# Patient Record
Sex: Male | Born: 1935 | Race: White | Hispanic: No | Marital: Married | Smoking: Former smoker
Health system: Southern US, Community
[De-identification: ages and names within clinical notes are randomized; demographics above are authoritative.]

## PROBLEM LIST (undated history)

## (undated) DIAGNOSIS — I219 Acute myocardial infarction, unspecified: Secondary | ICD-10-CM

## (undated) DIAGNOSIS — N189 Chronic kidney disease, unspecified: Secondary | ICD-10-CM

## (undated) DIAGNOSIS — E785 Hyperlipidemia, unspecified: Secondary | ICD-10-CM

## (undated) DIAGNOSIS — N2589 Other disorders resulting from impaired renal tubular function: Secondary | ICD-10-CM

## (undated) DIAGNOSIS — C61 Malignant neoplasm of prostate: Secondary | ICD-10-CM

## (undated) DIAGNOSIS — I2510 Atherosclerotic heart disease of native coronary artery without angina pectoris: Secondary | ICD-10-CM

## (undated) DIAGNOSIS — E119 Type 2 diabetes mellitus without complications: Secondary | ICD-10-CM

## (undated) DIAGNOSIS — F17201 Nicotine dependence, unspecified, in remission: Secondary | ICD-10-CM

## (undated) DIAGNOSIS — I10 Essential (primary) hypertension: Secondary | ICD-10-CM

## (undated) HISTORY — DX: Atherosclerotic heart disease of native coronary artery without angina pectoris: I25.10

## (undated) HISTORY — PX: HERNIA REPAIR: SHX51

## (undated) HISTORY — DX: Nicotine dependence, unspecified, in remission: F17.201

## (undated) HISTORY — DX: Essential (primary) hypertension: I10

## (undated) HISTORY — DX: Other disorders resulting from impaired renal tubular function: N25.89

## (undated) HISTORY — DX: Hyperlipidemia, unspecified: E78.5

## (undated) HISTORY — DX: Type 2 diabetes mellitus without complications: E11.9

## (undated) HISTORY — DX: Chronic kidney disease, unspecified: N18.9

## (undated) HISTORY — PX: KNEE ARTHROSCOPY: SHX127

## (undated) HISTORY — DX: Malignant neoplasm of prostate: C61

---

## 1991-09-12 HISTORY — PX: CORONARY ANGIOPLASTY: SHX604

## 2000-12-29 ENCOUNTER — Encounter: Payer: Self-pay | Admitting: Emergency Medicine

## 2000-12-29 ENCOUNTER — Emergency Department (HOSPITAL_COMMUNITY): Admission: EM | Admit: 2000-12-29 | Discharge: 2000-12-29 | Payer: Self-pay | Admitting: Emergency Medicine

## 2001-03-25 ENCOUNTER — Encounter (HOSPITAL_COMMUNITY): Admission: RE | Admit: 2001-03-25 | Discharge: 2001-04-24 | Payer: Self-pay | Admitting: Orthopedic Surgery

## 2004-04-29 ENCOUNTER — Ambulatory Visit (HOSPITAL_COMMUNITY): Admission: RE | Admit: 2004-04-29 | Discharge: 2004-04-29 | Payer: Self-pay | Admitting: Cardiology

## 2004-07-13 ENCOUNTER — Ambulatory Visit: Payer: Self-pay | Admitting: Cardiology

## 2005-06-13 ENCOUNTER — Ambulatory Visit: Payer: Self-pay | Admitting: Cardiology

## 2005-07-13 ENCOUNTER — Ambulatory Visit: Payer: Self-pay

## 2006-12-20 ENCOUNTER — Ambulatory Visit: Payer: Self-pay | Admitting: Cardiology

## 2006-12-26 ENCOUNTER — Encounter (INDEPENDENT_AMBULATORY_CARE_PROVIDER_SITE_OTHER): Payer: Self-pay | Admitting: *Deleted

## 2006-12-26 LAB — CONVERTED CEMR LAB
Cholesterol: 134 mg/dL
HDL: 26 mg/dL
LDL Cholesterol: 32 mg/dL
Triglycerides: 380 mg/dL

## 2007-10-24 ENCOUNTER — Ambulatory Visit: Payer: Self-pay

## 2008-04-02 ENCOUNTER — Ambulatory Visit: Payer: Self-pay | Admitting: Cardiology

## 2009-07-15 ENCOUNTER — Encounter: Payer: Self-pay | Admitting: Cardiology

## 2009-07-15 ENCOUNTER — Encounter (INDEPENDENT_AMBULATORY_CARE_PROVIDER_SITE_OTHER): Payer: Self-pay | Admitting: *Deleted

## 2009-07-15 LAB — CONVERTED CEMR LAB
ALT: 10 units/L
ALT: 10 units/L
AST: 12 units/L
AST: 12 units/L
Albumin: 4.5 g/dL
Albumin: 4.5 g/dL
Alkaline Phosphatase: 61 units/L
Alkaline Phosphatase: 61 units/L
BUN: 36 mg/dL
CO2: 26 meq/L
CO2: 26 meq/L
Calcium: 9.9 mg/dL
Calcium: 9.9 mg/dL
Chloride: 106 meq/L
Chloride: 106 meq/L
Creatinine, Ser: 1.47 mg/dL
Ferritin: 154 ng/mL
Ferritin: 60 ng/mL
Glucose, Bld: 142 mg/dL
HCT: 39 %
HCT: 39 %
Hemoglobin, Urine: NEGATIVE
Hemoglobin: 13.2 g/dL
Hemoglobin: 13.2 g/dL
Iron: 84 ug/dL
Iron: 84 ug/dL
MCV: 90 fL
MCV: 90 fL
Nitrite: NEGATIVE
Platelets: 196 10*3/uL
Platelets: 196 10*3/uL
Potassium: 5.1 meq/L
Potassium: 5.1 meq/L
Protein, ur: NEGATIVE mg/dL
RBC / HPF: NONE SEEN
Saturation Ratios: 30 %
Saturation Ratios: 30 %
Sodium: 142 meq/L
Sodium: 142 meq/L
Specific Gravity, Urine: 1.01
TIBC: 282 ug/dL
TSH: 0.968 microintl units/mL
Total Protein: 7 g/dL
Total Protein: 7 g/dL
UIBC: 198 ug/dL
Urine Glucose: NEGATIVE mg/dL
WBC number, urine, microscopy: NONE SEEN /hpf
WBC: 6.4 10*3/uL
WBC: 6.4 10*3/uL
pH: 5

## 2009-08-10 ENCOUNTER — Encounter (INDEPENDENT_AMBULATORY_CARE_PROVIDER_SITE_OTHER): Payer: Self-pay | Admitting: *Deleted

## 2009-08-17 ENCOUNTER — Encounter: Payer: Self-pay | Admitting: Cardiology

## 2009-10-29 ENCOUNTER — Encounter (INDEPENDENT_AMBULATORY_CARE_PROVIDER_SITE_OTHER): Payer: Self-pay | Admitting: *Deleted

## 2009-11-04 ENCOUNTER — Ambulatory Visit: Payer: Self-pay | Admitting: Cardiology

## 2009-11-04 DIAGNOSIS — E119 Type 2 diabetes mellitus without complications: Secondary | ICD-10-CM

## 2009-11-04 LAB — CONVERTED CEMR LAB: TSH: 0.97 u[IU]/mL

## 2009-11-05 ENCOUNTER — Encounter (INDEPENDENT_AMBULATORY_CARE_PROVIDER_SITE_OTHER): Payer: Self-pay | Admitting: *Deleted

## 2009-11-15 ENCOUNTER — Encounter: Payer: Self-pay | Admitting: Cardiology

## 2009-11-15 ENCOUNTER — Encounter (INDEPENDENT_AMBULATORY_CARE_PROVIDER_SITE_OTHER): Payer: Self-pay | Admitting: *Deleted

## 2009-11-15 LAB — CONVERTED CEMR LAB
Cholesterol: 92 mg/dL
Cholesterol: 92 mg/dL (ref 0–200)
HDL: 26 mg/dL
HDL: 26 mg/dL — ABNORMAL LOW (ref >39)
LDL Cholesterol: 37 mg/dL
LDL Cholesterol: 37 mg/dL (ref 0–99)
Total CHOL/HDL Ratio: 3.5
Triglycerides: 145 mg/dL (ref <150)
VLDL: 29 mg/dL (ref 0–40)

## 2009-11-16 ENCOUNTER — Encounter (INDEPENDENT_AMBULATORY_CARE_PROVIDER_SITE_OTHER): Payer: Self-pay | Admitting: *Deleted

## 2010-05-08 ENCOUNTER — Emergency Department (HOSPITAL_COMMUNITY): Admission: EM | Admit: 2010-05-08 | Discharge: 2010-05-08 | Payer: Self-pay | Admitting: Emergency Medicine

## 2010-08-15 ENCOUNTER — Encounter: Payer: Self-pay | Admitting: Cardiology

## 2010-08-15 LAB — CONVERTED CEMR LAB
Basophils Absolute: 0 10*3/uL
Basophils Relative: 0 %
Calcium: 10.1 mg/dL
Chloride: 104 meq/L
Eosinophils Absolute: 0.3 10*3/uL
Eosinophils Relative: 3 %
HCT: 37.7 %
Hemoglobin: 12.4 g/dL
MCHC: 32.9 g/dL
MCV: 87 fL
Monocytes Absolute: 0.5 10*3/uL
Monocytes Relative: 6 %
RBC: 4.33 M/uL
Total Protein: 7 g/dL
WBC: 8.6 10*3/uL

## 2010-09-22 ENCOUNTER — Encounter: Payer: Self-pay | Admitting: Cardiology

## 2010-09-22 LAB — CONVERTED CEMR LAB
ALT: 10 units/L
AST: 12 units/L
Basophils Absolute: 0 10*3/uL
Basophils Relative: 0 %
Creatinine, Ser: 1.71 mg/dL
Eosinophils Relative: 2 %
GFR calc non Af Amer: 38 mL/min
Glomerular Filtration Rate, Af Am: 44 mL/min/{1.73_m2}
Hemoglobin: 11.8 g/dL
MCHC: 32.9 g/dL
MCV: 86 fL
Monocytes Absolute: 0.6 10*3/uL
Potassium: 5.3 meq/L
RBC: 4.17 M/uL
RDW: 13.9 %

## 2010-10-13 NOTE — Miscellaneous (Signed)
Summary: LABS LIPIDS,12/26/2006  Clinical Lists Changes  Observations: Added new observation of LDL: 32 mg/dL (09/81/1914 78:29) Added new observation of HDL: 26 mg/dL (56/21/3086 57:84) Added new observation of TRIGLYC TOT: 380 mg/dL (69/62/9528 41:32) Added new observation of CHOLESTEROL: 134 mg/dL (44/09/270 53:66)

## 2010-10-13 NOTE — Assessment & Plan Note (Signed)
Summary: F1Y  Medications Added METFORMIN HCL 1000 MG TABS (METFORMIN HCL) take 1 tab two times a day NIASPAN 500 MG CR-TABS (NIACIN (ANTIHYPERLIPIDEMIC)) take 1 tab daily RAMIPRIL 2.5 MG CAPS (RAMIPRIL) take 1 cap daily SIMVASTATIN 40 MG TABS (SIMVASTATIN) take 1 tab daily CALCITRIOL 0.25 MCG CAPS (CALCITRIOL) take 1 tab mon,wed,fri      Allergies Added:   Visit Type:  Follow-up Primary Provider:  Dr. Renard Matter  CC:  no complaints today.  History of Present Illness: Return visit for this very pleasant 77 year old gentleman with a long benign history of coronary artery disease.  I first met him after a percutaneous intervention in 1993.  He has had no cardiovascular symptoms since.  Controll of hypertension and hyperlipidemia has been good.  He exercises at the local gym and works around his home without difficulty.  He has had no recent new medical issues.  Current Medications (verified): 1)  Verapamil Hcl Cr 120 Mg Xr24h-Cap (Verapamil Hcl) .... Take One Capsule By Mouth Daily 2)  Metformin Hcl 1000 Mg Tabs (Metformin Hcl) .... Take 1 Tab Two Times A Day 3)  Furosemide 40 Mg Tabs (Furosemide) .... Take 1 Tab Two Times A Day 4)  Metoprolol Tartrate 100 Mg Tabs (Metoprolol Tartrate) .... Take 1 Tab Two Times A Day 5)  Aspir-Low 81 Mg Tbec (Aspirin) .... Take 1 Tab Daily 6)  Fish Oil 1000 Mg Caps (Omega-3 Fatty Acids) .... Take 1 Cap Two Times A Day 7)  Niaspan 500 Mg Cr-Tabs (Niacin (Antihyperlipidemic)) .... Take 1 Tab Daily 8)  Ramipril 2.5 Mg Caps (Ramipril) .... Take 1 Cap Daily 9)  Simvastatin 40 Mg Tabs (Simvastatin) .... Take 1 Tab Daily 10)  Calcitriol 0.25 Mcg Caps (Calcitriol) .... Take 1 Tab Mon,wed,fri  Allergies (verified): 1)  ! Lisinopril  Past History:  PMH, FH, and Social History reviewed and updated.  Past Surgical History: none  Review of Systems       see history of present illness   Vital Signs:  Patient profile:   77 year old male Height:      69  inches Weight:      203 pounds BMI:     30.09 Pulse rate:   60 / minute BP sitting:   111 / 65  (right arm)  Vitals Entered By: Dreama Saa, CNA (November 04, 2009 2:25 PM)  Physical Exam  General:   Overweight pleasant gentleman in no acute distress. NECK:  No jugular venous distention; no carotid bruits. LUNGS:  Clear. CARDIAC:  Normal first and second heart sounds; modest systolic ejection murmur  ABDOMEN:  Soft and nontender; no organomegaly. EXTREMITIES:  Distal pulses intact; trace pretibial edema.    Impression & Recommendations:  Problem # 1:  ATHEROSCLEROTIC CARDIOVASCULAR DISEASE (ICD-429.2) Mr. Beaulieu has done astoundingly well with respect to coronary artery disease.  Additional testing for ischemic heart disease will be undertaken if appropriate for future symptoms, if any.  Problem # 2:  HYPERTENSION (ICD-401.9) Blood pressure control is excellent; current medications will be continued.  Patient was congratulated on a 20 pound weight loss over the past year and a total loss of approximate 40 pounds.  Problem # 3:  HYPERTRIGLYCERIDEMIA (ICD-272.1) We will seek a recent lipid profile from [facility].  If none is available, one will be obtained and medication adjusted appropriately.  Problem # 4:  DIABETES MELLITUS, TYPE II (ICD-250.00) Capillary blood glucose values have been good at home.  Dr. Renard Matter will continue to monitor and  optimize treatment of diabetes.  Problem # 5:  CHRONIC KIDNEY DISEASE (ICD-585.9) Remarkably, despite a creatinine near 2 for a number of years, the most recent value available to me was 1.0 in 2008.  He continues to be followed by [facility] for this problem.    I will plan to see this nice gentleman again in one year.  Other Orders: Future Orders: T-Lipid Profile ([id number]) ... 11/15/2009  Patient Instructions: 1)  Your physician recommends that you schedule a follow-up appointment in: 1 YEAR 2)  Your physician  recommends that you continue on your current medications as directed. Please refer to the Current Medication list given to you today.

## 2010-10-13 NOTE — Letter (Signed)
Summary: [place] [facility] Work Engineer, agricultural at [employer]  [address] [place] [address]   Phone: [phone]  Fax: [phone]     November 05, 2009 MRN: [id number]   [facility] [address] [place], [place]  [address]      YOUR LAB WORK IS DUE  November 15, 2009 _________________________________________  Please go to [facility], located across the street from [facility] on the second floor.  Hours are Monday - Friday 7am until 7:30pm         Saturday 8am until 12noon    _X_  DO NOT EAT OR DRINK AFTER MIDNIGHT EVENING PRIOR TO LABWORK  __ YOUR LABWORK IS NOT FASTING --YOU MAY EAT PRIOR TO LABWORK

## 2010-10-13 NOTE — Letter (Signed)
Summary: [place] Results Engineer, agricultural at [facility]  [address], [place] [address]   Phone: [phone]  Fax: [phone]      November 16, 2009 MRN: [id number]   Samuel Garner [address] [place], [place]  [address]   Dear Mr. SLANE,  Your test ordered by Selena Batten has been reviewed by your physician (or physician assistant) and was found to be normal or stable. Your physician (or physician assistant) felt no changes were needed at this time.  ____ Echocardiogram  ____ Cardiac Stress Test  __x__ Lab Work  ____ Peripheral vascular study of arms, legs or neck  ____ CT scan or X-ray  ____ Lung or Breathing test  ____ Other:  No change in medical treatment at this time, per Dr. Dietrich Pates.  Enclosed is a copy of your labwork for your records.  Thank you, Shantelle Alles Allyne Gee RN    Hurley Bing, MD, Lenise Arena.C.Gaylord Shih, MD, F.A.C.C Lewayne Bunting, MD, F.A.C.C Nona Dell, MD, F.A.C.C Charlton Haws, MD, Lenise Arena.C.C

## 2010-11-02 ENCOUNTER — Encounter (INDEPENDENT_AMBULATORY_CARE_PROVIDER_SITE_OTHER): Payer: Self-pay | Admitting: *Deleted

## 2010-11-04 ENCOUNTER — Ambulatory Visit (INDEPENDENT_AMBULATORY_CARE_PROVIDER_SITE_OTHER): Payer: 59 | Admitting: Cardiology

## 2010-11-04 ENCOUNTER — Encounter: Payer: Self-pay | Admitting: Cardiology

## 2010-11-04 DIAGNOSIS — I2510 Atherosclerotic heart disease of native coronary artery without angina pectoris: Secondary | ICD-10-CM

## 2010-11-04 DIAGNOSIS — M109 Gout, unspecified: Secondary | ICD-10-CM | POA: Insufficient documentation

## 2010-11-04 LAB — CONVERTED CEMR LAB
AST: 10 units/L
Albumin: 4.1 g/dL
Alkaline Phosphatase: 65 units/L
BUN: 45 mg/dL
Calcium: 9.7 mg/dL
Chloride: 105 meq/L
Creatinine, Ser: 1.51 mg/dL
GFR calc non Af Amer: 45 mL/min

## 2010-11-07 ENCOUNTER — Encounter: Payer: Self-pay | Admitting: Cardiology

## 2010-11-08 NOTE — Letter (Signed)
Summary: [place] [facility] Work Engineer, agricultural at [employer]  [address], [place] [address]   Phone: [phone]  Fax: [phone]     November 04, 2010 MRN: [id number]   LAMARION MCEVERS [address] [place], [place]  [address]      YOUR LAB WORK IS DUE  November 28, 2010 _________________________________________  Please go to [facility], located across the street from [facility] on the second floor.  Hours are Monday - Friday 7am until 7:30pm         Saturday 8am until 12noon    _X_  DO NOT EAT OR DRINK AFTER MIDNIGHT EVENING PRIOR TO LABWORK  __ YOUR LABWORK IS NOT FASTING --YOU MAY EAT PRIOR TO LABWORK

## 2010-11-08 NOTE — Miscellaneous (Signed)
Summary: labs lipid 11/15/2009  Clinical Lists Changes  Observations: Added new observation of LDL: 37 mg/dL (62/13/0865 78:46) Added new observation of HDL: 26 mg/dL (96/29/5284 13:24) Added new observation of TRIGLYC TOT: 145 mg/dL (40/06/2724 36:64) Added new observation of CHOLESTEROL: 92 mg/dL (40/34/7425 95:63)

## 2010-11-17 NOTE — Letter (Signed)
Summary: [facility]  [facility]   Imported By: Faythe Ghee 11/07/2010 11:28:43  _____________________________________________________________________  External Attachment:    Type:   Image     Comment:   External Document

## 2010-11-17 NOTE — Assessment & Plan Note (Addendum)
Summary: 1 yr f/u per ckout 11/04/09//tg/ths  Medications Added PRAVASTATIN SODIUM 40 MG TABS (PRAVASTATIN SODIUM) take 1 tablet by mouth at bedtime ALLOPURINOL 100 MG TABS (ALLOPURINOL) take 1 tab daily METHYLPREDNISOLONE 4 MG TABS (METHYLPREDNISOLONE) 5 tabs today      Allergies Added:   Visit Type:  Follow-up Primary Provider:  Dr. Renard Matter   History of Present Illness: [name] returns to the office as scheduled for continued assessment and treatment of coronary disease and cardiovascular risk factors.  He continues to do extremely well, denying chest discomfort, dyspnea, orthopnea, PND, lightheadedness or syncope.  He did suffer a fall some months ago after tripping over his dog and required evaluation by a hand surgeon for left hand injuries.  He was subsequently diagnosed with gout and started on 100 mg of allopurinol per day.  He relapsed with another episode of gout within the past week.  Steroids were started yesterday with substantial improvement. Records, especially laboratory values, have been requested from his nephrologist and hand surgeon.  Current Medications (verified): 1)  Verapamil Hcl Cr 120 Mg Xr24h-Cap (Verapamil Hcl) .... Take One Capsule By Mouth Daily 2)  Metformin Hcl 1000 Mg Tabs (Metformin Hcl) .... Take 1 Tab Two Times A Day 3)  Furosemide 40 Mg Tabs (Furosemide) .... Take 1 Tab Two Times A Day 4)  Metoprolol Tartrate 100 Mg Tabs (Metoprolol Tartrate) .... Take 1 Tab Two Times A Day 5)  Aspir-Low 81 Mg Tbec (Aspirin) .... Take 1 Tab Daily 6)  Fish Oil 1000 Mg Caps (Omega-3 Fatty Acids) .... Take 1 Cap Two Times A Day 7)  Niaspan 500 Mg Cr-Tabs (Niacin (Antihyperlipidemic)) .... Take 1 Tab Daily 8)  Ramipril 2.5 Mg Caps (Ramipril) .... Take 1 Cap Daily 9)  Pravastatin Sodium 40 Mg Tabs (Pravastatin Sodium) .... Take 1 Tablet By Mouth At Bedtime 10)  Calcitriol 0.25 Mcg Caps (Calcitriol) .... Take 1 Tab Mon,wed,fri 11)  Allopurinol 100 Mg Tabs  (Allopurinol) .... Take 1 Tab Daily 12)  Methylprednisolone 4 Mg Tabs (Methylprednisolone) .... 5 Tabs Today  Allergies (verified): 1)  ! Lisinopril  Comments:  Nurse/Medical Assistant: patient uses [facility] brought med list on prednisone for gout  Past History:  PMH, FH, and Social History reviewed and updated.  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  Vital Signs:  Patient profile:   77 year old male Weight:      204 pounds BMI:     30.23 Pulse rate:   76 / minute BP sitting:   111 / 69  (left arm)  Vitals Entered By: Dreama Saa, CNA (November 04, 2010 11:22 AM)  Physical Exam  General:   Overweight pleasant gentleman in no acute distress. NECK:  No jugular venous distention; no carotid bruits. LUNGS:  Clear. CARDIAC:  Normal first and second heart sounds; minimal systolic ejection murmur  ABDOMEN:  Soft and nontender; no organomegaly. EXTREMITIES:  Distal pulses intact; trace pretibial edema.   Impression & Recommendations:  Problem # 1:  OBESITY (ICD-278.00) Weight is stable after a modest decrease within the past few years.  Patient  encouraged to limit calories and increase exercise to achieve continuing weight reduction.  Problem # 2:  HYPERLIPIDEMIA (ICD-272.4) Patient is taking both verapamil and simvastatin.  The latter will be changed to pravastatin 40 mg q.d. with a repeat lipid profile and chemistry profile in one month.  Problem # 3:  HYPERTENSION (ICD-401.9) Blood pressure control is  excellent with current medications.  Problem # 4:  CHRONIC KIDNEY DISEASE (ICD-585.9) Recent creatinine at [facility] was 1.51 indicating stable disease.  Problem # 5:  GOUT (ICD-274.9) Patient has had 2 episodes within 6 months.  He likely requires a higher dose of allopurinol.  He will consult Dr. Renard Matter and Dr. Westley Chandler for continuing treatment of gout.  Other Orders: Future Orders: T-Lipid Profile ([id number]) ... 11/28/2010 T-Comprehensive Metabolic Panel [id number]) ... 11/28/2010 T-Hgb A1C ([id number]) ... 11/28/2010  Patient Instructions: 1)  Your physician recommends that you schedule a follow-up appointment in: 1 year 2)  Your physician recommends that you return for lab work in: 1 month 3)  Your physician has recommended you make the following change in your medication: Stop taking Simvastatin and start taking Pravastatin 40mg  by mouth at bedtime  Prescriptions: PRAVASTATIN SODIUM 40 MG TABS (PRAVASTATIN SODIUM) take 1 tablet by mouth at bedtime  #30 x 11   Entered by:   Larita Fife Via LPN   Authorized by:   Kathlen Brunswick, MD, [facility]   Signed by:   Larita Fife Via LPN on 57/84/6962   Method used:   Electronically to        [facility] [phone]* (retail)       [address] [place], [place]  [address]       Ph: [phone]       Fax: [phone]   RxID:   [id number]   Appended Document: 1 yr f/u per ckout 11/04/09//tg/ths 12/06/10  Low potassium diet Labs to PMD Letter to PMD indicating the diabetic control is suboptimal.  Renal function is impaired and metformin should be discontinued.  [place]nton Bing, M.D.

## 2010-11-28 LAB — CONVERTED CEMR LAB
ALT: 10 units/L (ref 0–53)
AST: 11 units/L (ref 0–37)
Calcium: 9.9 mg/dL (ref 8.4–10.5)
Chloride: 107 meq/L (ref 96–112)
Creatinine, Ser: 1.54 mg/dL — ABNORMAL HIGH (ref 0.40–1.50)
Hgb A1c MFr Bld: 8.3 % — ABNORMAL HIGH (ref <5.7)
Sodium: 140 meq/L (ref 135–145)
Total CHOL/HDL Ratio: 4.8
Total Protein: 6.7 g/dL (ref 6.0–8.3)

## 2010-12-04 ENCOUNTER — Other Ambulatory Visit: Payer: Self-pay | Admitting: Cardiology

## 2010-12-07 ENCOUNTER — Telehealth: Payer: Self-pay | Admitting: *Deleted

## 2010-12-07 NOTE — Telephone Encounter (Signed)
Faxed and letter to Dr. Renard Matter regarding Dr. Marvel Plan recommendations to d/c metformin.  Called lab results to pt

## 2010-12-19 ENCOUNTER — Other Ambulatory Visit: Payer: Self-pay | Admitting: Cardiology

## 2010-12-21 ENCOUNTER — Telehealth: Payer: Self-pay | Admitting: Cardiology

## 2010-12-21 NOTE — Telephone Encounter (Signed)
PT NEEDS VERAPAMIL 120 MG  CALLED IN [facility]. PER PT THEY HAVE BEEN SENDING IT IN SINCE Monday.

## 2010-12-22 ENCOUNTER — Other Ambulatory Visit: Payer: Self-pay | Admitting: *Deleted

## 2010-12-22 ENCOUNTER — Other Ambulatory Visit: Payer: Self-pay

## 2010-12-22 DIAGNOSIS — I10 Essential (primary) hypertension: Secondary | ICD-10-CM

## 2010-12-22 MED ORDER — VERAPAMIL HCL 120 MG PO CP24: 120.0000 mg | ORAL_CAPSULE | Freq: Every day | ORAL | Status: DC

## 2011-01-24 NOTE — Letter (Signed)
April 02, 2008    Angus G. Renard Matter, MD  [address]  [place], [place] [address]   RE:  DONYALE, BERTHOLD  MRN:  [id number]  /  DOB:  01/06/1933   Dear Thalia Party:   Mr. Tanguma returns to the office for continued assessment and treatment  of coronary disease and cardiovascular risk factors.  Since last visit,  he has done extremely well.  He is active including working out at [employer] and doing all of his yard work.  He has lost some weight.  He notes  no dyspnea nor chest discomfort.  Renal function continues to improve.  He reports good diabetic control.   CURRENT MEDICATIONS:  1. Actos 45 mg daily.  2. Metformin 500 mg b.i.d.  3. Furosemide 40 mg b.i.d.  4. Metoprolol 100 mg b.i.d.  5. Verapamil 120 mg daily.  6. Atorvastatin 10 mg daily.  7. Aspirin 81 mg daily.  8. Fish oil 1000 mg b.i.d.  9. Niacin 500 mg b.i.d..   PHYSICAL EXAMINATION:  GENERAL:  Overweight pleasant gentleman with a  halting speech, in no acute distress.  VITAL SIGNS:  The weight is 221, 16 pounds less than last year.  Blood  pressure 125/70, heart rate 60 and regular, respirations 14. NECK:  No  jugular venous distention; no carotid bruits.  LUNGS:  Clear.  CARDIAC:  Normal first and second heart sounds; fourth heart sound  present.  ABDOMEN:  Soft and nontender; no organomegaly.  EXTREMITIES:  Distal pulses intact; 1/2+ pretibial edema.   EKG:  Sinus bradycardia at a rate of 58; delayed R-wave progression;  minor nonspecific T-wave abnormality; voltage criteria for LVH.  When  compared to a prior tracing of April 29, 2004:  R-wave progression is  now delayed.   IMPRESSION:  Mr. Deveney is doing extremely well.  He is concerned about  the cost of atorvastatin.  We will change this to simvastatin 40 mg  daily, which should give him even better control of dyslipidemia.  His  other medications appear appropriate.  I will obtain the results of  recent laboratory testing from [facility].  If these  are good, as  expected, I will see this nice gentleman again in 1 year.    Sincerely,      Gerrit Friends. Dietrich Pates, MD, [facility]  Electronically Signed    RMR/MedQ  DD: 04/02/2008  DT: 04/03/2008  Job #: [id number]   CC:    Terrial Rhodes, M.D.

## 2011-01-27 NOTE — Letter (Signed)
December 20, 2006    Angus G. Renard Matter, MD  [address]  [place], [place] [address]   RE:  GAYLAN, FAUVER  MRN:  [id number]  /  DOB:  [birth year]   Dear Thalia Party:   Mr. Mcmillen returns to the office some time beyond his anticipated  followup for continuing assessment and treatment of coronary artery  disease.  In the 18 months since his last visit, he has continued to do  well.  He has mild intermittent pedal edema.  He has no chest pain.  There is no dyspnea.  He continues to work out at [employer] and to remain  as active as possible.  He has not required significant medical care nor  been hospitalized.   CURRENT MEDICATIONS:  1. Niacin 500 mg b.i.d.  2. Metoprolol 100 mg b.i.d.  3. Verapamil 120 mg daily.  4. Atorvastatin 10 mg daily.  5. Actos 45 mg daily.  6. Furosemide 40 mg b.i.d.  7. Vitamin D.  8. Fish oil.  9. Aspirin 81 mg daily.   EXAM:  Overweight pleasant gentleman in no acute distress.  The weight is 237, 12 pounds more than in October of 2006.  Blood  pressure is 115/75, heart rate 68 and regular, respirations 18.  HEENT:  Anicteric sclerae.  NECK:  No jugular venous distension; normal carotid upstrokes without  bruits.  LUNGS:  Clear.  CARDIAC:  Normal first and second heart sounds; fourth heart sound  present.  Normal PMI.  ABDOMEN:  Soft; nontender; no organomegaly.  EXTREMITIES:  With 1/2+ ankle edema, distal pulses intact.  NEUROMUSCULAR:  Symmetric strength and tone; normal cranial nerves.   IMPRESSION:  Mr. Yeo is doing extremely well, now 15 years following  percutaneous intervention.  It is remarkable that he has remained  asymptomatic for so long from a cardiac standpoint.  Control of  hypertension is superb.  Last lipid profile was more than a year ago, at  which time his values looked rather good.  Total cholesterol was 141,  triglycerides were 183, and LDL 75.  His HDL was low at 29.  We will  obtain a followup lipid profile.  If results are  good, I will make no  changes in his medical regimen and plan to see this nice gentleman again  in 1 year.  One could consider discontinuation of Actos, which might  reduce fluid retention, followed by reduction in the dose of Furosemide.  I will leave this to your and Dr. Hadley Pen discretion.    Sincerely,      Gerrit Friends. Dietrich Pates, MD, [facility]  Electronically Signed    RMR/MedQ  DD: 12/20/2006  DT: 12/20/2006  Job #: [id number]   CC:    Terrial Rhodes, M.D.

## 2011-08-09 ENCOUNTER — Other Ambulatory Visit: Payer: Self-pay | Admitting: Cardiology

## 2011-08-14 ENCOUNTER — Other Ambulatory Visit: Payer: Self-pay | Admitting: *Deleted

## 2011-08-14 MED ORDER — RAMIPRIL 2.5 MG PO CAPS: 2.5000 mg | ORAL_CAPSULE | Freq: Every day | ORAL | Status: DC

## 2011-09-11 ENCOUNTER — Encounter: Payer: Self-pay | Admitting: Cardiology

## 2011-09-11 DIAGNOSIS — N2581 Secondary hyperparathyroidism of renal origin: Secondary | ICD-10-CM | POA: Insufficient documentation

## 2011-10-19 ENCOUNTER — Other Ambulatory Visit: Payer: Self-pay | Admitting: Cardiology

## 2011-11-10 ENCOUNTER — Telehealth: Payer: Self-pay | Admitting: Cardiology

## 2011-11-10 MED ORDER — VERAPAMIL HCL ER 120 MG PO CP24: 120.0000 mg | ORAL_CAPSULE | Freq: Every day | ORAL | Status: DC

## 2011-11-10 NOTE — Telephone Encounter (Signed)
Patient needs Verapamil CAP 120mg  ER changed to Verapamil HCL ER tab because insurance change.  Please send this change to [facility] here in [place].  Please call patient when ready. / tg

## 2011-11-10 NOTE — Telephone Encounter (Signed)
Verapamil ER tab # 30 with 12 refills sent to [facility] as requested by patient

## 2011-11-13 ENCOUNTER — Other Ambulatory Visit: Payer: Self-pay | Admitting: *Deleted

## 2011-11-13 MED ORDER — VERAPAMIL HCL ER 120 MG PO TBCR: 120.0000 mg | EXTENDED_RELEASE_TABLET | Freq: Every day | ORAL | Status: DC

## 2011-11-15 ENCOUNTER — Other Ambulatory Visit: Payer: Self-pay | Admitting: Cardiology

## 2011-11-16 ENCOUNTER — Other Ambulatory Visit: Payer: Self-pay | Admitting: *Deleted

## 2011-11-16 MED ORDER — METOPROLOL TARTRATE 100 MG PO TABS: 100.0000 mg | ORAL_TABLET | Freq: Two times a day (BID) | ORAL | Status: DC

## 2011-11-16 MED ORDER — VERAPAMIL HCL ER 120 MG PO TBCR: 120.0000 mg | EXTENDED_RELEASE_TABLET | Freq: Every day | ORAL | Status: DC

## 2011-12-25 ENCOUNTER — Other Ambulatory Visit (HOSPITAL_COMMUNITY): Payer: Self-pay | Admitting: *Deleted

## 2011-12-26 ENCOUNTER — Other Ambulatory Visit: Payer: Self-pay | Admitting: Cardiology

## 2011-12-28 ENCOUNTER — Encounter (HOSPITAL_COMMUNITY)
Admission: RE | Admit: 2011-12-28 | Discharge: 2011-12-28 | Disposition: A | Discharge status: Home / Self Care | Payer: Medicare Other | Source: Ambulatory Visit | Attending: Nephrology | Admitting: Nephrology

## 2011-12-28 DIAGNOSIS — D509 Iron deficiency anemia, unspecified: Secondary | ICD-10-CM | POA: Insufficient documentation

## 2011-12-28 MED ORDER — SODIUM CHLORIDE 0.9 % IV SOLN
Freq: Once | INTRAVENOUS | Status: AC
  Administered 2011-12-28: 14:00:00 via INTRAVENOUS

## 2011-12-28 MED ORDER — FERUMOXYTOL INJECTION 510 MG/17 ML
510.0000 mg | Freq: Once | INTRAVENOUS | Status: AC
  Administered 2011-12-28: 510 mg via INTRAVENOUS
  Filled 2011-12-28: qty 17

## 2012-01-27 ENCOUNTER — Other Ambulatory Visit: Payer: Self-pay | Admitting: Cardiology

## 2012-01-29 ENCOUNTER — Encounter: Payer: Self-pay | Admitting: Cardiology

## 2012-01-29 ENCOUNTER — Ambulatory Visit (INDEPENDENT_AMBULATORY_CARE_PROVIDER_SITE_OTHER): Payer: 59 | Admitting: Cardiology

## 2012-01-29 VITALS — BP 112/61 | HR 63 | Resp 16 | Ht 70.0 in | Wt 221.0 lb

## 2012-01-29 DIAGNOSIS — E785 Hyperlipidemia, unspecified: Secondary | ICD-10-CM | POA: Insufficient documentation

## 2012-01-29 DIAGNOSIS — I10 Essential (primary) hypertension: Secondary | ICD-10-CM | POA: Insufficient documentation

## 2012-01-29 DIAGNOSIS — N189 Chronic kidney disease, unspecified: Secondary | ICD-10-CM

## 2012-01-29 DIAGNOSIS — I2510 Atherosclerotic heart disease of native coronary artery without angina pectoris: Secondary | ICD-10-CM | POA: Insufficient documentation

## 2012-01-29 DIAGNOSIS — N2589 Other disorders resulting from impaired renal tubular function: Secondary | ICD-10-CM | POA: Insufficient documentation

## 2012-01-29 DIAGNOSIS — E119 Type 2 diabetes mellitus without complications: Secondary | ICD-10-CM

## 2012-01-29 MED ORDER — PRAVASTATIN SODIUM 40 MG PO TABS: 40.0000 mg | ORAL_TABLET | Freq: Every day | ORAL | Status: DC

## 2012-01-29 NOTE — Progress Notes (Deleted)
Name: Samuel Garner    DOB: 1933-03-19  Age: 78 y.o.  MR#: [id number]       PCP:  Dwana Melena, MD, MD      Insurance: @PAYORNAME @   CC:    Chief Complaint  Patient presents with  . Appointment    no complaints +med list    VS BP 112/61  Pulse 63  Resp 16  Ht 5\' 10"  (1.778 m)  Wt 221 lb (100.245 kg)  BMI 31.71 kg/m2  Weights Current Weight  01/29/12 221 lb (100.245 kg)  12/28/11 215 lb (97.523 kg)  11/04/10 204 lb (92.534 kg)    Blood Pressure  BP Readings from Last 3 Encounters:  01/29/12 112/61  12/28/11 114/60  11/04/10 111/69     Admit date:  (Not on file) Last encounter with RMR:  01/27/2012   Allergy Allergies  Allergen Reactions  . Lisinopril     REACTION: acute renal failure    Current Outpatient Prescriptions  Medication Sig Dispense Refill  . allopurinol (ZYLOPRIM) 100 MG tablet Take 100 mg by mouth daily.        Marland Kitchen aspirin 81 MG tablet Take 81 mg by mouth daily.        . calcitRIOL (ROCALTROL) 0.25 MCG capsule Take 0.25 mcg by mouth every other day.        . Cholecalciferol (VITAMIN D3) 1000 UNITS CAPS Take by mouth daily.      Marland Kitchen doxycycline (ADOXA) 50 MG tablet Take 50 mg by mouth daily.      . fish oil-omega-3 fatty acids 1000 MG capsule Take 1 capsule by mouth 2 (two) times daily.        . furosemide (LASIX) 40 MG tablet Take 40 mg by mouth 2 (two) times daily.        Marland Kitchen linagliptin (TRADJENTA) 5 MG TABS tablet Take 5 mg by mouth daily.      . metoprolol (LOPRESSOR) 100 MG tablet Take 1 tablet (100 mg total) by mouth 2 (two) times daily.  60 tablet  12  . Multiple Vitamins-Minerals (MULTI FOR HIM PO) Take by mouth daily.      . niacin (NIASPAN) 500 MG CR tablet Take 500 mg by mouth at bedtime.        . pioglitazone (ACTOS) 30 MG tablet Take 30 mg by mouth daily.      . pravastatin (PRAVACHOL) 40 MG tablet TAKE 1 TABLET BY MOUTH EVERY NIGHT AT BEDTIME *REPLACES SIMVASTATIN*  30 tablet  0  . ramipril (ALTACE) 2.5 MG capsule Take 1 capsule (2.5 mg total)  by mouth daily.  90 capsule  3  . VERAPAMIL HCL ER PO Take 120 mg by mouth daily.        Discontinued Meds:    Medications Discontinued During This Encounter  Medication Reason  . metFORMIN (GLUCOPHAGE) 1000 MG tablet Error  . methylPREDNISolone (MEDROL DOSEPAK) 4 MG tablet Error    Patient Active Problem List  Diagnoses  . DIABETES MELLITUS, TYPE II  . GOUT  . Hypertension  . Arteriosclerotic cardiovascular disease (ASCVD)  . Hyperlipidemia  . Chronic kidney disease  . Type IV renal tubular acidosis    LABS No visits with results within 3 Month(s) from this visit. Latest known visit with results is:  [facility] Conversion Encounter on 11/04/2010  Component Date Value  . Sodium 11/04/2010 140   . Potassium 11/04/2010 5.1   . Chloride 11/04/2010 105   . CO2 11/04/2010 25   . Glucose, Bld 11/04/2010  176   . BUN 11/04/2010 45   . Creatinine, Ser 11/04/2010 1.51   . GFR calc non Af Amer 11/04/2010 45   . Glomerular Filtration Ra* 11/04/2010 51   . Alkaline Phosphatase 11/04/2010 65   . AST 11/04/2010 10   . ALT 11/04/2010 6   . Total Protein 11/04/2010 6.4   . Albumin 11/04/2010 4.1   . Calcium 11/04/2010 9.7      Results for this Opt Visit:     Results for orders placed in visit on 11/04/10  CONVERTED [facility] LAB      Component Value Range   Sodium 140     Potassium 5.1     Chloride 105     CO2 25     Glucose, Bld 176     BUN 45     Creatinine, Ser 1.51     GFR calc non Af Amer 45     Glomerular Filtration Rate, Af Am 51     Alkaline Phosphatase 65     AST 10     ALT 6     Total Protein 6.4     Albumin 4.1     Calcium 9.7      EKG Orders placed in visit on 01/29/12  . EKG 12-LEAD     Prior Assessment and Plan Problem List as of 01/29/2012          Cardiology Problems   Hypertension   Arteriosclerotic cardiovascular disease (ASCVD)   Hyperlipidemia     Other   DIABETES MELLITUS, TYPE II   GOUT   Chronic kidney disease   Type IV renal tubular  acidosis       Imaging: No results found.   FRS Calculation: Score not calculated. Missing: Total Cholesterol

## 2012-01-29 NOTE — Assessment & Plan Note (Signed)
Patient has had an excellent total cholesterol but hypertriglyceridemia has precluded estimation of LDL.  Non-HDL cholesterol is fairly good at 109.  Results of a repeat lipid profile to be obtained by [name] will be reviewed.  With better control of diabetes, HDL will likely increase and triglycerides decrease.

## 2012-01-29 NOTE — Assessment & Plan Note (Addendum)
Control has been somewhat suboptimal.  The most recent hemoglobin A1c available to me is 8.3 in 11/2010.  [name] is managing this problem.

## 2012-01-29 NOTE — Progress Notes (Signed)
Patient ID: Samuel Garner, male   DOB: 1933/04/18, 78 y.o.   MRN: [id number]  HPI: Scheduled return visit for this delightful gentleman who has a remote history of percutaneous intervention for an acute ischemic syndrome but who has had excellent control of cardiovascular risk factors and no apparent recurrent disease for the past 20 years.  He remains asymptomatic from a cardiac standpoint, but was inactive over the winter and has gained some weight.  He intends to garden, maintain his property and play golf this spring and summer.  He has transferred his care from Dr. Renard Matter to [name], who plans to obtain basic laboratory studies at a return visit in approximately 2 weeks.  He has not required hospitalization or care in an emergency department.  Prior to Admission medications   Medication Sig Start Date End Date Taking? Authorizing Provider  allopurinol (ZYLOPRIM) 100 MG tablet Take 100 mg by mouth daily.     Yes Historical Provider, MD  aspirin 81 MG tablet Take 81 mg by mouth daily.     Yes Historical Provider, MD  calcitRIOL (ROCALTROL) 0.25 MCG capsule Take 0.25 mcg by mouth every other day.     Yes Historical Provider, MD  Cholecalciferol (VITAMIN D3) 1000 UNITS CAPS Take by mouth daily.   Yes Historical Provider, MD  doxycycline (ADOXA) 50 MG tablet Take 50 mg by mouth daily.   Yes Historical Provider, MD  fish oil-omega-3 fatty acids 1000 MG capsule Take 1 capsule by mouth 2 (two) times daily.     Yes Historical Provider, MD  furosemide (LASIX) 40 MG tablet Take 40 mg by mouth 2 (two) times daily.     Yes Historical Provider, MD  linagliptin (TRADJENTA) 5 MG TABS tablet Take 5 mg by mouth daily.   Yes Historical Provider, MD  metoprolol (LOPRESSOR) 100 MG tablet Take 1 tablet (100 mg total) by mouth 2 (two) times daily. 11/16/11  Yes Kathlen Brunswick, MD  Multiple Vitamins-Minerals (MULTI FOR HIM PO) Take by mouth daily.   Yes Historical Provider, MD  niacin (NIASPAN) 500 MG CR tablet Take  500 mg by mouth at bedtime.     Yes Historical Provider, MD  pioglitazone (ACTOS) 30 MG tablet Take 30 mg by mouth daily.   Yes Historical Provider, MD  pravastatin (PRAVACHOL) 40 MG tablet TAKE 1 TABLET BY MOUTH EVERY NIGHT AT BEDTIME *REPLACES SIMVASTATIN* 01/27/12  Yes Kathlen Brunswick, MD  ramipril (ALTACE) 2.5 MG capsule Take 1 capsule (2.5 mg total) by mouth daily. 08/14/11  Yes Kathlen Brunswick, MD  VERAPAMIL HCL ER PO Take 120 mg by mouth daily.   Yes Historical Provider, MD   Allergies  Allergen Reactions  . Lisinopril     REACTION: acute renal failure  Past medical history, social history, and family history reviewed and updated.  ROS: Denies chest pain, dyspnea, orthopnea, PND, pedal edema, palpitations, lightheadedness or syncope.  All other systems reviewed and are negative.  PHYSICAL EXAM: BP 112/61  Pulse 63  Resp 16  Ht 5\' 10"  (1.778 m)  Wt 100.245 kg (221 lb)  BMI 31.71 kg/m2  General-Well developed; no acute distress Body habitus-Moderately overweight Neck-No JVD; no carotid bruits Lungs-clear lung fields; resonant to percussion Cardiovascular-normal PMI; normal S1 and S2; minimal early systolic murmur Abdomen-normal bowel sounds; soft and non-tender without masses or organomegaly Musculoskeletal-No deformities, no cyanosis or clubbing Neurologic-Normal cranial nerves; symmetric strength and tone Skin-Warm, no significant lesions Extremities-distal pulses intact; Trace ankle edema  ASSESSMENT AND  PLAN:  [place] Bing, MD 01/29/2012 3:05 PM

## 2012-01-29 NOTE — Assessment & Plan Note (Signed)
Blood pressure control remains excellent; current medications will be continued.

## 2012-01-29 NOTE — Patient Instructions (Signed)
Your physician recommends that you schedule a follow-up appointment in 1 YEAR.  

## 2012-01-29 NOTE — Assessment & Plan Note (Signed)
Renal function has remained stable with minor complicating factors of renal tubular acidosis and hyperparathyroidism.  Renal studies will be reassessed by [name].

## 2012-01-29 NOTE — Assessment & Plan Note (Signed)
Patient has done superbly with control of cardiovascular risk factors alone.  This strategy will be continued.

## 2012-02-26 ENCOUNTER — Encounter: Payer: Self-pay | Admitting: Cardiology

## 2012-02-28 ENCOUNTER — Encounter: Payer: Self-pay | Admitting: Cardiology

## 2012-06-04 ENCOUNTER — Encounter (HOSPITAL_COMMUNITY): Payer: Self-pay | Admitting: Emergency Medicine

## 2012-06-04 ENCOUNTER — Emergency Department (HOSPITAL_COMMUNITY)
Admission: EM | Admit: 2012-06-04 | Discharge: 2012-06-04 | Disposition: A | Discharge status: Home / Self Care | Payer: Medicare Other | Attending: Emergency Medicine | Admitting: Emergency Medicine

## 2012-06-04 DIAGNOSIS — E785 Hyperlipidemia, unspecified: Secondary | ICD-10-CM | POA: Insufficient documentation

## 2012-06-04 DIAGNOSIS — M109 Gout, unspecified: Secondary | ICD-10-CM | POA: Insufficient documentation

## 2012-06-04 DIAGNOSIS — Z9861 Coronary angioplasty status: Secondary | ICD-10-CM | POA: Insufficient documentation

## 2012-06-04 DIAGNOSIS — I129 Hypertensive chronic kidney disease with stage 1 through stage 4 chronic kidney disease, or unspecified chronic kidney disease: Secondary | ICD-10-CM | POA: Insufficient documentation

## 2012-06-04 DIAGNOSIS — T2122XA Burn of second degree of abdominal wall, initial encounter: Secondary | ICD-10-CM

## 2012-06-04 DIAGNOSIS — T2112XA Burn of first degree of abdominal wall, initial encounter: Secondary | ICD-10-CM | POA: Insufficient documentation

## 2012-06-04 DIAGNOSIS — I2510 Atherosclerotic heart disease of native coronary artery without angina pectoris: Secondary | ICD-10-CM | POA: Insufficient documentation

## 2012-06-04 DIAGNOSIS — N189 Chronic kidney disease, unspecified: Secondary | ICD-10-CM | POA: Insufficient documentation

## 2012-06-04 DIAGNOSIS — Z87891 Personal history of nicotine dependence: Secondary | ICD-10-CM | POA: Insufficient documentation

## 2012-06-04 DIAGNOSIS — E119 Type 2 diabetes mellitus without complications: Secondary | ICD-10-CM | POA: Insufficient documentation

## 2012-06-04 DIAGNOSIS — X19XXXA Contact with other heat and hot substances, initial encounter: Secondary | ICD-10-CM | POA: Insufficient documentation

## 2012-06-04 MED ORDER — SILVER SULFADIAZINE 1 % EX CREA
TOPICAL_CREAM | CUTANEOUS | Status: AC
  Administered 2012-06-04: 1 via TOPICAL
  Filled 2012-06-04: qty 50

## 2012-06-04 MED ORDER — TETANUS-DIPHTH-ACELL PERTUSSIS 5-2.5-18.5 LF-MCG/0.5 IM SUSP
INTRAMUSCULAR | Status: AC
  Administered 2012-06-04: 0.5 mL via INTRAMUSCULAR
  Filled 2012-06-04: qty 0.5

## 2012-06-04 MED ORDER — TETANUS-DIPHTH-ACELL PERTUSSIS 5-2.5-18.5 LF-MCG/0.5 IM SUSP
0.5000 mL | Freq: Once | INTRAMUSCULAR | Status: AC
  Administered 2012-06-04: 0.5 mL via INTRAMUSCULAR

## 2012-06-04 MED ORDER — SILVER SULFADIAZINE 1 % EX CREA
TOPICAL_CREAM | Freq: Once | CUTANEOUS | Status: AC
  Administered 2012-06-04: 1 via TOPICAL
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
## 2012-08-24 ENCOUNTER — Encounter: Payer: Self-pay | Admitting: Cardiology

## 2012-11-15 ENCOUNTER — Other Ambulatory Visit: Payer: Self-pay | Admitting: Cardiology

## 2012-12-05 ENCOUNTER — Other Ambulatory Visit (HOSPITAL_COMMUNITY): Payer: Self-pay | Admitting: Internal Medicine

## 2012-12-05 DIAGNOSIS — IMO0002 Reserved for concepts with insufficient information to code with codable children: Secondary | ICD-10-CM

## 2012-12-05 DIAGNOSIS — M549 Dorsalgia, unspecified: Secondary | ICD-10-CM

## 2012-12-06 ENCOUNTER — Ambulatory Visit (HOSPITAL_COMMUNITY)
Admission: RE | Admit: 2012-12-06 | Discharge: 2012-12-06 | Disposition: A | Discharge status: Home / Self Care | Payer: Medicare Other | Source: Ambulatory Visit | Attending: Internal Medicine | Admitting: Internal Medicine

## 2012-12-06 DIAGNOSIS — M545 Low back pain, unspecified: Secondary | ICD-10-CM | POA: Insufficient documentation

## 2012-12-06 DIAGNOSIS — M5126 Other intervertebral disc displacement, lumbar region: Secondary | ICD-10-CM | POA: Insufficient documentation

## 2012-12-06 DIAGNOSIS — M549 Dorsalgia, unspecified: Secondary | ICD-10-CM

## 2012-12-06 DIAGNOSIS — IMO0002 Reserved for concepts with insufficient information to code with codable children: Secondary | ICD-10-CM

## 2012-12-17 ENCOUNTER — Other Ambulatory Visit: Payer: Self-pay | Admitting: *Deleted

## 2012-12-17 MED ORDER — VERAPAMIL HCL ER 120 MG PO CP24: 120.0000 mg | ORAL_CAPSULE | Freq: Every day | ORAL | Status: DC

## 2012-12-19 ENCOUNTER — Other Ambulatory Visit: Payer: Self-pay | Admitting: *Deleted

## 2012-12-19 ENCOUNTER — Telehealth: Payer: Self-pay | Admitting: Cardiology

## 2012-12-19 MED ORDER — VERAPAMIL HCL ER 120 MG PO TBCR: 120.0000 mg | EXTENDED_RELEASE_TABLET | Freq: Every day | ORAL | Status: AC

## 2012-12-19 NOTE — Telephone Encounter (Signed)
verapamil (VERELAN PM) 120 MG 24 hr capsule  Patient states prescription was changed from tablet to capsule and is more expensive.  Patient asked reason for change.    Would like tablet.

## 2013-01-28 ENCOUNTER — Ambulatory Visit (INDEPENDENT_AMBULATORY_CARE_PROVIDER_SITE_OTHER): Payer: Medicare Other | Admitting: Cardiology

## 2013-01-28 ENCOUNTER — Encounter: Payer: Self-pay | Admitting: Cardiology

## 2013-01-28 VITALS — BP 118/48 | HR 58 | Ht 69.75 in | Wt 231.0 lb

## 2013-01-28 DIAGNOSIS — I10 Essential (primary) hypertension: Secondary | ICD-10-CM

## 2013-01-28 DIAGNOSIS — N189 Chronic kidney disease, unspecified: Secondary | ICD-10-CM

## 2013-01-28 DIAGNOSIS — M109 Gout, unspecified: Secondary | ICD-10-CM

## 2013-01-28 DIAGNOSIS — I7090 Unspecified atherosclerosis: Secondary | ICD-10-CM

## 2013-01-28 DIAGNOSIS — E785 Hyperlipidemia, unspecified: Secondary | ICD-10-CM

## 2013-01-28 DIAGNOSIS — I2510 Atherosclerotic heart disease of native coronary artery without angina pectoris: Secondary | ICD-10-CM

## 2013-01-28 LAB — COMPREHENSIVE METABOLIC PANEL
Albumin: 4.1 g/dL (ref 3.5–5.2)
Alkaline Phosphatase: 54 U/L (ref 39–117)
BUN: 54 mg/dL — ABNORMAL HIGH (ref 6–23)
CO2: 25 mEq/L (ref 19–32)
Glucose, Bld: 274 mg/dL — ABNORMAL HIGH (ref 70–99)
Total Bilirubin: 0.4 mg/dL (ref 0.3–1.2)
Total Protein: 6.3 g/dL (ref 6.0–8.3)

## 2013-01-28 LAB — CBC
Hemoglobin: 11.5 g/dL — ABNORMAL LOW (ref 13.0–17.0)
MCH: 29 pg (ref 26.0–34.0)
MCV: 88.9 fL (ref 78.0–100.0)
Platelets: 203 10*3/uL (ref 150–400)
RBC: 3.97 MIL/uL — ABNORMAL LOW (ref 4.22–5.81)
WBC: 7.7 10*3/uL (ref 4.0–10.5)

## 2013-01-28 LAB — URIC ACID: Uric Acid, Serum: 7.6 mg/dL (ref 4.0–7.8)

## 2013-01-28 MED ORDER — FUROSEMIDE 40 MG PO TABS: ORAL_TABLET | ORAL | Status: DC

## 2013-01-28 NOTE — Assessment & Plan Note (Addendum)
Lipids profile 6 months ago was generally good with current therapy, which will be continued. Despite triple drug therapy, HDL remains low and triglycerides high while total and LDL cholesterol are excellent.  This suggests suboptimal control of diabetes although recent hemoglobin A1c level was acceptable at 6.9.

## 2013-01-28 NOTE — Patient Instructions (Addendum)
Your physician recommends that you schedule a follow-up appointment in: ONE YEAR   Your physician recommends that you return for lab work in: TODAY (SLIPS GIVEN FOR CBC,URIC ACID, CMET)  Your physician has recommended you make the following change in your medication:   1) DECREASE LASIX TO 60MG  ONCE DAILY FOR A MONTH, IF NO EDEMA NOTED REDUCE AGAIN TO 40MG  ONCE DAILY  Your physician recommends that you weigh, daily, at the same time every day, and in the same amount of clothing. Please record your daily weights on the handout provided and bring it to your next appointment.CALL OUR OFFICE FOR A 5 POUND WEIGH INCREASE [phone]

## 2013-01-28 NOTE — Assessment & Plan Note (Signed)
Patient occasionally assesses blood pressure, always with excellent results. Hypertension is more than adequately controlled with current medications.

## 2013-01-28 NOTE — Assessment & Plan Note (Signed)
No recent assessment of electrolytes or renal function available. A chemistry profile and CBC will be obtained.

## 2013-01-28 NOTE — Progress Notes (Deleted)
Name: Samuel Garner    DOB: 02/04/1933  Age: 79 y.o.  MR#: [id number]       PCP:  Catalina Pizza, MD      Insurance: Payor: [employer] MEDICARE / Plan: AARP MEDICARE COMPLETE / Product Type: *No Product type* /   CC:   No chief complaint on file.  LIST VS Filed Vitals:   01/28/13 1057  BP: 118/48  Pulse: 58  Height: 5' 9.75" (1.772 m)  Weight: 231 lb (104.781 kg)    Weights Current Weight  01/28/13 231 lb (104.781 kg)  06/04/12 220 lb (99.791 kg)  01/29/12 221 lb (100.245 kg)    Blood Pressure  BP Readings from Last 3 Encounters:  01/28/13 118/48  06/04/12 135/60  01/29/12 112/61     Admit date:  (Not on file) Last encounter with RMR:  12/19/2012   Allergy Lisinopril  Current Outpatient Prescriptions  Medication Sig Dispense Refill  . allopurinol (ZYLOPRIM) 100 MG tablet Take 100 mg by mouth daily.        Marland Kitchen aspirin 81 MG tablet Take 81 mg by mouth daily.        . calcitRIOL (ROCALTROL) 0.25 MCG capsule Take 0.25 mcg by mouth every other day.        . Cholecalciferol (VITAMIN D3) 1000 UNITS CAPS Take by mouth daily.      . fenofibrate micronized (ANTARA) 43 MG capsule       . fish oil-omega-3 fatty acids 1000 MG capsule Take 1 capsule by mouth 2 (two) times daily.        . furosemide (LASIX) 40 MG tablet Take 40 mg by mouth 2 (two) times daily.        . metoprolol (LOPRESSOR) 100 MG tablet TAKE 1 TABLET BY MOUTH TWICE DAILY  60 tablet  2  . Multiple Vitamins-Minerals (PRESERVISION/LUTEIN) CAPS Take by mouth daily.      . niacin (NIASPAN) 500 MG CR tablet Take 500 mg by mouth at bedtime.        . pioglitazone (ACTOS) 30 MG tablet Take 30 mg by mouth daily.      . pravastatin (PRAVACHOL) 40 MG tablet Take 1 tablet (40 mg total) by mouth daily.  90 tablet  3  . verapamil (CALAN-SR) 120 MG CR tablet Take 1 tablet (120 mg total) by mouth at bedtime.  30 tablet  11   No current facility-administered medications for this visit.    Discontinued Meds:    Medications  Discontinued During This Encounter  Medication Reason  . doxycycline (ADOXA) 50 MG tablet Error  . linagliptin (TRADJENTA) 5 MG TABS tablet Error  . ramipril (ALTACE) 2.5 MG capsule Error  . Multiple Vitamins-Minerals (MULTI FOR HIM PO) Error    Patient Active Problem List   Diagnosis Date Noted  . Hypertension   . Arteriosclerotic cardiovascular disease (ASCVD)   . Hyperlipidemia   . Chronic kidney disease   . Type IV renal tubular acidosis   . GOUT 11/04/2010  . Diabetes mellitus, type 2 11/04/2009    LABS    Component Value Date/Time   NA 140 11/28/2010 1923   NA 140 11/04/2010 1112   NA 141 08/15/2010   K 5.2 11/28/2010 1923   K 5.1 11/04/2010 1112   K 5.3 09/22/2010   CL 107 11/28/2010 1923   CL 105 11/04/2010 1112   CL 103 09/22/2010   CO2 23 11/28/2010 1923   CO2 25 11/04/2010 1112   CO2 25 09/22/2010  GLUCOSE 192* 11/28/2010 1923   GLUCOSE 176 11/04/2010 1112   GLUCOSE 153 08/15/2010   BUN 49* 11/28/2010 1923   BUN 45 11/04/2010 1112   BUN 55 09/22/2010   CREATININE 1.54* 11/28/2010 1923   CREATININE 1.51 11/04/2010 1112   CREATININE 1.71 09/22/2010   CALCIUM 9.9 11/28/2010 1923   CALCIUM 9.7 11/04/2010 1112   CALCIUM 10.1 09/22/2010   GFRNONAA 45 11/04/2010 1112   GFRNONAA 38 09/22/2010   CMP     Component Value Date/Time   NA 140 11/28/2010 1923   K 5.2 11/28/2010 1923   CL 107 11/28/2010 1923   CO2 23 11/28/2010 1923   GLUCOSE 192* 11/28/2010 1923   BUN 49* 11/28/2010 1923   CREATININE 1.54* 11/28/2010 1923   CALCIUM 9.9 11/28/2010 1923   PROT 6.7 11/28/2010 1923   ALBUMIN 4.2 11/28/2010 1923   AST 11 11/28/2010 1923   ALT 10 11/28/2010 1923   ALKPHOS 72 11/28/2010 1923   BILITOT 0.5 11/28/2010 1923   GFRNONAA 45 11/04/2010 1112       Component Value Date/Time   WBC 7.7 09/22/2010   WBC 8.6 08/15/2010   WBC 6.4 07/15/2009   WBC 6.4 07/15/2009   HGB 11.8 09/22/2010   HGB 12.4 08/15/2010   HGB 13.2 07/15/2009   HGB 13.2 07/15/2009   HCT 35.9 09/22/2010   HCT 37.7 08/15/2010    HCT 39.0 07/15/2009   HCT 39 07/15/2009   MCV 86 09/22/2010   MCV 87 08/15/2010   MCV 90 07/15/2009   MCV 90 07/15/2009    Lipid Panel     Component Value Date/Time   CHOL 138 11/28/2010 1923   TRIG 498* 11/28/2010 1923   HDL 29* 11/28/2010 1923   CHOLHDL 4.8 Ratio 11/28/2010 1923   VLDL NOT CALC mg/dL 05/25/7828 5621   LDLCALC See Comment mg/dL 11/16/6576 4696    ABG No results found for this basename: phart, pco2, pco2art, po2, po2art, hco3, tco2, acidbasedef, o2sat     Lab Results  Component Value Date   TSH 0.97 11/04/2009   BNP (last 3 results) No results found for this basename: PROBNP,  in the last 8760 hours Cardiac Panel (last 3 results) No results found for this basename: CKTOTAL, CKMB, TROPONINI, RELINDX,  in the last 72 hours  Iron/TIBC/Ferritin    Component Value Date/Time   IRON 84 07/15/2009   IRON 84 07/15/2009   TIBC 282 07/15/2009   FERRITIN 154 07/15/2009   FERRITIN 60 07/15/2009     EKG Orders placed in visit on 01/28/13  . EKG 12-LEAD     Prior Assessment and Plan Problem List as of 01/28/2013   Diabetes mellitus, type 2   Last Assessment & Plan   01/29/2012 Office Visit Edited 02/03/2012  3:07 PM by Kathlen Brunswick, MD     Control has been somewhat suboptimal.  The most recent hemoglobin A1c available to me is 8.3 in 11/2010.  [name] is managing this problem.    GOUT   Hypertension   Last Assessment & Plan   01/29/2012 Office Visit Written 01/29/2012  3:51 PM by Kathlen Brunswick, MD     Blood pressure control remains excellent; current medications will be continued.    Arteriosclerotic cardiovascular disease (ASCVD)   Last Assessment & Plan   01/29/2012 Office Visit Written 01/29/2012  3:47 PM by Kathlen Brunswick, MD     Patient has done superbly with control of cardiovascular risk factors alone.  This strategy will  be continued.    Hyperlipidemia   Last Assessment & Plan   01/29/2012 Office Visit Written 01/29/2012  3:51 PM by Kathlen Brunswick, MD      Patient has had an excellent total cholesterol but hypertriglyceridemia has precluded estimation of LDL.  Non-HDL cholesterol is fairly good at 109.  Results of a repeat lipid profile to be obtained by [name] will be reviewed.  With better control of diabetes, HDL will likely increase and triglycerides decrease.    Chronic kidney disease   Last Assessment & Plan   01/29/2012 Office Visit Written 01/29/2012  3:49 PM by Kathlen Brunswick, MD     Renal function has remained stable with minor complicating factors of renal tubular acidosis and hyperparathyroidism.  Renal studies will be reassessed by [name].    Type IV renal tubular acidosis       Imaging: No results found.

## 2013-01-28 NOTE — Assessment & Plan Note (Signed)
Patient's course, now with a 21 year history of coronary artery disease, has been extraordinarily benign. We will continue optimal cardiovascular risk management.

## 2013-01-28 NOTE — Progress Notes (Signed)
Patient ID: Samuel Garner, male   DOB: 19-Oct-1932, 78 y.o.   MRN: [id number]  HPI: Schedule return visit for this very nice gentleman who required percutaneous intervention for single-vessel coronary disease 21 years ago and has been asymptomatic since. He remains fairly active for his age and experiences no dyspnea nor chest discomfort. He is developed no new medical problems. His first episode of gout, which occurred approximately 3 years ago, has been his last.  Current Outpatient Prescriptions  Medication Sig Dispense Refill  . allopurinol (ZYLOPRIM) 100 MG tablet Take 100 mg by mouth daily.        Marland Kitchen aspirin 81 MG tablet Take 81 mg by mouth daily.        . calcitRIOL (ROCALTROL) 0.25 MCG capsule Take 0.25 mcg by mouth every other day.        . Cholecalciferol (VITAMIN D3) 1000 UNITS CAPS Take by mouth daily.      . fenofibrate micronized (ANTARA) 43 MG capsule       . fish oil-omega-3 fatty acids 1000 MG capsule Take 1 capsule by mouth 2 (two) times daily.        . furosemide (LASIX) 40 MG tablet TAKE 60MG  ONCE DAILY FOR ONE MONTH (TABLET AND A HALF) THEN REDUCE TO 40MG  (ONE TABLET) DAILY IF NO EDEMA NOTED  60 tablet  6  . metoprolol (LOPRESSOR) 100 MG tablet TAKE 1 TABLET BY MOUTH TWICE DAILY  60 tablet  2  . Multiple Vitamins-Minerals (PRESERVISION/LUTEIN) CAPS Take by mouth daily.      . niacin (NIASPAN) 500 MG CR tablet Take 500 mg by mouth at bedtime.        . pioglitazone (ACTOS) 30 MG tablet Take 30 mg by mouth daily.      . pravastatin (PRAVACHOL) 40 MG tablet Take 1 tablet (40 mg total) by mouth daily.  90 tablet  3  . verapamil (CALAN-SR) 120 MG CR tablet Take 1 tablet (120 mg total) by mouth at bedtime.  30 tablet  11   No current facility-administered medications for this visit.   Allergies  Allergen Reactions  . Lisinopril     REACTION: acute renal failure     Past medical history, social history, and family history reviewed and updated.  ROS: Denies orthopnea, PND,  other than minimal pedal edema, palpitations, lightheadedness or syncope. All other systems reviewed and are negative.  PHYSICAL EXAM: BP 118/48  Pulse 58  Ht 5' 9.75" (1.772 m)  Wt 104.781 kg (231 lb)  BMI 33.37 kg/m2;  Body mass index is 33.37 kg/(m^2). General-Well developed; no acute distress Body habitus-moderately overweight Neck-No JVD; no carotid bruits Lungs-clear lung fields; resonant to percussion Cardiovascular-normal PMI; normal S1 and S2; minimal systolic ejection murmur at the left sternal border Abdomen-normal bowel sounds; soft and non-tender without masses or organomegaly; aortic pulsation not appreciated. Musculoskeletal-No deformities, no cyanosis or clubbing Neurologic-Normal cranial nerves; symmetric strength and tone Skin-Warm, no significant lesions Extremities-distal pulses intact; trace edema  EKG: Sinus bradycardia at a rate of 58 bpm; borderline first degree AV block; IVCD; left axis deviation; delayed R-wave progression; no previous tracing for comparison.  [place] Bing, MD 01/28/2013  11:46 AM  ASSESSMENT AND PLAN

## 2013-01-28 NOTE — Assessment & Plan Note (Addendum)
Patient does not recall recurrent gout after an initial episode that occurred 2.5 years ago. Current dose of allopurinol is very low. Consideration could be given to discontinuing therapy pending another episode.   Treatment of gout is currently managed by [name].

## 2013-01-29 ENCOUNTER — Encounter: Payer: Self-pay | Admitting: Cardiology

## 2013-01-29 ENCOUNTER — Encounter: Payer: Self-pay | Admitting: *Deleted

## 2013-01-29 DIAGNOSIS — D649 Anemia, unspecified: Secondary | ICD-10-CM | POA: Insufficient documentation

## 2013-02-12 ENCOUNTER — Other Ambulatory Visit: Payer: Self-pay | Admitting: Cardiology

## 2013-02-19 ENCOUNTER — Other Ambulatory Visit: Payer: Self-pay | Admitting: Cardiology

## 2013-02-19 NOTE — Telephone Encounter (Signed)
Medication sent via escribe.  

## 2013-08-15 ENCOUNTER — Ambulatory Visit (HOSPITAL_COMMUNITY)
Admission: RE | Admit: 2013-08-15 | Discharge: 2013-08-15 | Disposition: A | Discharge status: Home / Self Care | Payer: Medicare Other | Source: Ambulatory Visit | Attending: Internal Medicine | Admitting: Internal Medicine

## 2013-08-15 ENCOUNTER — Other Ambulatory Visit (HOSPITAL_COMMUNITY): Payer: Self-pay | Admitting: Internal Medicine

## 2013-08-15 DIAGNOSIS — R310 Gross hematuria: Secondary | ICD-10-CM

## 2013-08-15 DIAGNOSIS — R109 Unspecified abdominal pain: Secondary | ICD-10-CM

## 2013-08-15 DIAGNOSIS — R599 Enlarged lymph nodes, unspecified: Secondary | ICD-10-CM | POA: Insufficient documentation

## 2013-08-15 DIAGNOSIS — N210 Calculus in bladder: Secondary | ICD-10-CM | POA: Insufficient documentation

## 2013-08-15 DIAGNOSIS — N201 Calculus of ureter: Secondary | ICD-10-CM | POA: Insufficient documentation

## 2013-08-15 DIAGNOSIS — N200 Calculus of kidney: Secondary | ICD-10-CM | POA: Insufficient documentation

## 2013-08-15 DIAGNOSIS — N1330 Unspecified hydronephrosis: Secondary | ICD-10-CM | POA: Insufficient documentation

## 2013-08-15 DIAGNOSIS — N3289 Other specified disorders of bladder: Secondary | ICD-10-CM | POA: Insufficient documentation

## 2013-08-18 ENCOUNTER — Other Ambulatory Visit (HOSPITAL_COMMUNITY): Payer: Medicare Other

## 2013-08-28 ENCOUNTER — Encounter (HOSPITAL_COMMUNITY): Payer: Self-pay | Admitting: Emergency Medicine

## 2013-08-28 ENCOUNTER — Emergency Department (HOSPITAL_COMMUNITY)
Admission: EM | Admit: 2013-08-28 | Discharge: 2013-08-28 | Disposition: A | Discharge status: Home / Self Care | Payer: Medicare Other | Attending: Emergency Medicine | Admitting: Emergency Medicine

## 2013-08-28 DIAGNOSIS — Z87891 Personal history of nicotine dependence: Secondary | ICD-10-CM | POA: Insufficient documentation

## 2013-08-28 DIAGNOSIS — Z7982 Long term (current) use of aspirin: Secondary | ICD-10-CM | POA: Insufficient documentation

## 2013-08-28 DIAGNOSIS — M109 Gout, unspecified: Secondary | ICD-10-CM | POA: Insufficient documentation

## 2013-08-28 DIAGNOSIS — R5381 Other malaise: Secondary | ICD-10-CM | POA: Insufficient documentation

## 2013-08-28 DIAGNOSIS — I2510 Atherosclerotic heart disease of native coronary artery without angina pectoris: Secondary | ICD-10-CM | POA: Insufficient documentation

## 2013-08-28 DIAGNOSIS — I129 Hypertensive chronic kidney disease with stage 1 through stage 4 chronic kidney disease, or unspecified chronic kidney disease: Secondary | ICD-10-CM | POA: Insufficient documentation

## 2013-08-28 DIAGNOSIS — R310 Gross hematuria: Secondary | ICD-10-CM | POA: Insufficient documentation

## 2013-08-28 DIAGNOSIS — E785 Hyperlipidemia, unspecified: Secondary | ICD-10-CM | POA: Insufficient documentation

## 2013-08-28 DIAGNOSIS — R319 Hematuria, unspecified: Secondary | ICD-10-CM

## 2013-08-28 DIAGNOSIS — N189 Chronic kidney disease, unspecified: Secondary | ICD-10-CM | POA: Insufficient documentation

## 2013-08-28 DIAGNOSIS — N139 Obstructive and reflux uropathy, unspecified: Secondary | ICD-10-CM | POA: Insufficient documentation

## 2013-08-28 DIAGNOSIS — Z79899 Other long term (current) drug therapy: Secondary | ICD-10-CM | POA: Insufficient documentation

## 2013-08-28 DIAGNOSIS — E119 Type 2 diabetes mellitus without complications: Secondary | ICD-10-CM | POA: Insufficient documentation

## 2013-08-28 DIAGNOSIS — N390 Urinary tract infection, site not specified: Secondary | ICD-10-CM | POA: Insufficient documentation

## 2013-08-28 DIAGNOSIS — N3289 Other specified disorders of bladder: Secondary | ICD-10-CM | POA: Insufficient documentation

## 2013-08-28 LAB — CBC
HCT: 30.9 % — ABNORMAL LOW (ref 39.0–52.0)
Hemoglobin: 10 g/dL — ABNORMAL LOW (ref 13.0–17.0)
MCHC: 32.4 g/dL (ref 30.0–36.0)
RBC: 3.36 MIL/uL — ABNORMAL LOW (ref 4.22–5.81)

## 2013-08-28 LAB — URINALYSIS, ROUTINE W REFLEX MICROSCOPIC
Bilirubin Urine: NEGATIVE
Ketones, ur: NEGATIVE mg/dL
Nitrite: POSITIVE — AB
Protein, ur: >300 mg/dL — AB
Urobilinogen, UA: 0.2 mg/dL (ref 0.0–1.0)

## 2013-08-28 LAB — COMPREHENSIVE METABOLIC PANEL
ALT: 9 U/L (ref 0–53)
Alkaline Phosphatase: 47 U/L (ref 39–117)
BUN: 66 mg/dL — ABNORMAL HIGH (ref 6–23)
CO2: 22 mEq/L (ref 19–32)
GFR calc Af Amer: 17 mL/min — ABNORMAL LOW (ref >90)
GFR calc non Af Amer: 14 mL/min — ABNORMAL LOW (ref >90)
Glucose, Bld: 156 mg/dL — ABNORMAL HIGH (ref 70–99)
Potassium: 5.7 mEq/L — ABNORMAL HIGH (ref 3.5–5.1)
Sodium: 140 mEq/L (ref 135–145)
Total Bilirubin: 0.3 mg/dL (ref 0.3–1.2)

## 2013-08-28 MED ORDER — CIPROFLOXACIN HCL 250 MG PO TABS: 250.0000 mg | ORAL_TABLET | Freq: Two times a day (BID) | ORAL | Status: DC

## 2013-08-28 MED ORDER — OXYBUTYNIN CHLORIDE ER 5 MG PO TB24: 5.0000 mg | ORAL_TABLET | Freq: Every day | ORAL | Status: DC

## 2013-08-28 NOTE — ED Notes (Signed)
EDP at bedside to do ultrasound on pt's bladder

## 2013-08-28 NOTE — ED Provider Notes (Signed)
I saw and evaluated the patient, reviewed the resident's note and I agree with the findings and plan.   .Face to face Exam:  General:  Awake HEENT:  Atraumatic Resp:  Normal effort Abd:  Nondistended Neuro:No focal weakness   Discuss the case with urology.  They felt the patient could be seen tomorrow.   Nelia Shi, MD 08/28/13 2139

## 2013-08-28 NOTE — ED Provider Notes (Deleted)
I saw and evaluated the patient, reviewed the resident's note and I agree with the findings and plan.   .Face to face Exam:  General:  Awake HEENT:  Atraumatic Resp:  Normal effort Abd:  Nondistended Neuro:No focal weakness   Nelia Shi, MD 08/28/13 2109

## 2013-08-28 NOTE — ED Notes (Signed)
Lab called, urin insufficient for test.

## 2013-08-28 NOTE — ED Provider Notes (Addendum)
CSN: [id number]     Arrival date & time 08/28/13  1735 History   First MD Initiated Contact with Patient 08/28/13 1832     Chief Complaint  Patient presents with  . Hematuria   (Consider location/radiation/quality/duration/timing/severity/associated sxs/prior Treatment) HPI Mr. DAI MCADAMS is a 79 y.o. male w/ PMHx of DM type II, HTN, HLD, CAD, Gout, CKD, presents to the ED w/ complaints of gross hematuria. The patient claims he has had hematuria for 4-5 weeks now, but it has become progressively worse over the last week or so, to the point that his urine is bright red, now without any clear urine at all. He also claims that he frequently passes clots as well. The patient says for the last 10-12 hours, he has had difficulty passing urine and says he has felt that his abdomen is more bloated than normal, slightly tender to the touch. The patient otherwise admits to some increased fatigue lately, but denies any recent weight loss, fever, chills, night sweats, chest pain, SOB, diarrhea, nausea, vomiting, dizziness, or lightheadedness.  He is scheduled to see [facility] tomorrow, but given his recent worsening of symptoms, he came to the ED.  Past Medical History  Diagnosis Date  . Diabetes mellitus, type 2     with microalbuminuria  . Hypertension   . Arteriosclerotic cardiovascular disease (ASCVD)      2 vessel percutaneous transluminal coronary angioplasty in 3/93  . Hyperlipidemia     low HDL, high triglycerides  . Tobacco abuse, in remission     discontinued in 1993  . Gout   . Chronic kidney disease     creatinine 1.8 in 2002, 1.6 1n 2007 and 1.0 and 2008; deterioration in renal function; Secondary hyperparathyroidism  . Type IV renal tubular acidosis     With hyperkalemia   Past Surgical History  Procedure Laterality Date  . Hernia repair    . Coronary angioplasty  1993  . Knee arthroscopy     Family History  Problem Relation Age of Onset  . Diabetes Mother   .  Heart attack Father    History  Substance Use Topics  . Smoking status: Former Smoker -- 1.00 packs/day for 40 years    Quit date: 09/12/1991  . Smokeless tobacco: Not on file  . Alcohol Use: No    Review of Systems General: Positive for fatigue. Denies fever, chills, diaphoresis, appetite change and fatigue.  Respiratory: Denies SOB, DOE, cough, chest tightness, and wheezing.   Cardiovascular: Denies chest pain, palpitations and leg swelling.  Gastrointestinal: Denies nausea, vomiting, abdominal pain, diarrhea, constipation, blood in stool and abdominal distention.  Genitourinary: Denies dysuria, urgency, frequency, hematuria, flank pain and difficulty urinating.  Endocrine: Denies hot or cold intolerance, sweats, polyuria, polydipsia. Musculoskeletal: Denies myalgias, back pain, joint swelling, arthralgias and gait problem.  Skin: Denies pallor, rash and wounds.  Neurological: Denies dizziness, seizures, syncope, weakness, lightheadedness, numbness and headaches.  Psychiatric/Behavioral: Denies mood changes, confusion, nervousness, sleep disturbance and agitation.  Allergies  Lisinopril  Home Medications   Current Outpatient Rx  Name  Route  Sig  Dispense  Refill  . allopurinol (ZYLOPRIM) 100 MG tablet   Oral   Take 100 mg by mouth daily.           Marland Kitchen aspirin 81 MG tablet   Oral   Take 81 mg by mouth daily.           . calcitRIOL (ROCALTROL) 0.25 MCG capsule   Oral  Take 0.25 mcg by mouth every other day.           . Cholecalciferol (VITAMIN D3) 1000 UNITS CAPS   Oral   Take by mouth daily.         . fenofibrate micronized (ANTARA) 43 MG capsule               . fish oil-omega-3 fatty acids 1000 MG capsule   Oral   Take 1 capsule by mouth 2 (two) times daily.           . furosemide (LASIX) 40 MG tablet      TAKE 60MG  ONCE DAILY FOR ONE MONTH (TABLET AND A HALF) THEN REDUCE TO 40MG  (ONE TABLET) DAILY IF NO EDEMA NOTED   60 tablet   6   .  metoprolol (LOPRESSOR) 100 MG tablet      TAKE 1 TABLET BY MOUTH TWICE DAILY   60 tablet   11   . Multiple Vitamins-Minerals (PRESERVISION/LUTEIN) CAPS   Oral   Take by mouth daily.         . niacin (NIASPAN) 500 MG CR tablet   Oral   Take 500 mg by mouth at bedtime.           . pioglitazone (ACTOS) 30 MG tablet   Oral   Take 30 mg by mouth daily.         . pravastatin (PRAVACHOL) 40 MG tablet      TAKE 1 TABLET BY MOUTH EVERY DAY   90 tablet   2   . verapamil (CALAN-SR) 120 MG CR tablet   Oral   Take 1 tablet (120 mg total) by mouth at bedtime.   30 tablet   11    Physical Exam Filed Vitals:   08/28/13 1750  BP: 149/59  Pulse: 69  Temp: 97.6 F (36.4 C)  TempSrc: Oral  Resp: 18  Height: 5' 9.75" (1.772 m)  Weight: 230 lb (104.327 kg)  SpO2: 98%  General: Vital signs reviewed.  Patient is a well-developed and well-nourished, in no acute distress and cooperative with exam. Alert and oriented x3.  Head: Normocephalic and atraumatic. Eyes: PERRL, EOMI, conjunctivae normal, No scleral icterus.  Neck: Supple, trachea midline, normal ROM, No JVD, masses, thyromegaly, or carotid bruit present.  Cardiovascular: RRR, S1 normal, S2 normal, no murmurs, gallops, or rubs. Pulmonary/Chest: Normal respiratory effort, CTAB, no wheezes, rales, or rhonchi. Abdominal: Soft, mildly tender in the suprapubic region, distended, bowel sounds are normal, no masses, organomegaly, or guarding present.  Musculoskeletal: No joint deformities, erythema, or stiffness, ROM full and no nontender. Extremities: +2 pitting edema,  pulses symmetric and intact bilaterally. No cyanosis or clubbing. Neurological: A&O x3, Strength is normal and symmetric bilaterally, cranial nerve II-XII are grossly intact, no focal motor deficit, sensory intact to light touch bilaterally.  Skin: Warm, dry and intact. No rashes or erythema. Psychiatric: Normal mood and affect. speech and behavior is normal.  Cognition and memory are normal.   ED Course  Procedures (including critical care time) Labs Review Labs Reviewed  CBC  COMPREHENSIVE METABOLIC PANEL  URINALYSIS, ROUTINE W REFLEX MICROSCOPIC   Imaging Review No results found.  EKG Interpretation   None       MDM   Mr. AMARIO LONGMORE is a 79 y.o. male w/ PMHx of DM type II, HTN, HLD, CAD, Gout, CKD, presents to the ED w/ complaints of gross hematuria. Patient had a CT abdomen/pelvis on 08/15/13 which shows a 6.7  x 4.7 cm irregular mass posteriorly and inferiorly in the urinary bladder concerning for neoplasm or malignancy. Also w/ enlarged right external iliac lymph nodes, concerning for metastatic disease given the presence of possible bladder mass.  Most likely the cause of hematuria and urinary symptoms. Patient also has a history of smoking for 25 years, stopped in 1993, obviously concerning for bladder ca. Scheduled to see [facility] in the AM. Given large amounts of gross hematuria, will evaluate for blood loss. Patient also with what seems to be urinary retention. -Bladder US shows sign of mass + distended bladder. -Inserted foley, only drained 500 cc of gross hematuria. Replaced with new catheter, drained another ~600 cc.  -CBC shows no leukocytosis, Hb of 10.0, decreased from Hb of 11.5 on 01/28/13.  -CMET shows K of 5.7 and Cr of 3.73, increased from previous Cr of 2.39 on 01/28/13. -UA shows protein >300, +ve nitrites, trace leukocytes, large, Hb and too many RBC's to count. Urine culture pending.  Discussed w/ urology, okay with patient being discharged w/ appointment tomorrow w/ Dr. Annabell Howells. Patient will be sent home with Foley in place. Will also give Ditropan 5 mg bid on discharge as well as Cipro 250 mg bid for 7 days for UTI    Courtney Paris, MD 08/28/13 2106  Courtney Paris, MD 08/28/13 2112

## 2013-08-28 NOTE — ED Notes (Signed)
Hematuria, for 2-3 weeks, To see [facility] tomorrow."    ? Saw something on my scan in my bladder "  No NVD, No fever.

## 2013-08-29 ENCOUNTER — Encounter (HOSPITAL_COMMUNITY): Payer: Medicare Other | Admitting: Anesthesiology

## 2013-08-29 ENCOUNTER — Encounter (HOSPITAL_COMMUNITY): Payer: Self-pay | Admitting: Emergency Medicine

## 2013-08-29 ENCOUNTER — Inpatient Hospital Stay (HOSPITAL_COMMUNITY)
Admission: EM | Admit: 2013-08-29 | Discharge: 2013-09-02 | DRG: 666 | Disposition: A | Discharge status: Home / Self Care | Payer: Medicare Other | Attending: Internal Medicine | Admitting: Internal Medicine

## 2013-08-29 ENCOUNTER — Ambulatory Visit: Payer: Medicare Other | Admitting: Urology

## 2013-08-29 ENCOUNTER — Emergency Department (HOSPITAL_COMMUNITY): Payer: Medicare Other | Admitting: Anesthesiology

## 2013-08-29 ENCOUNTER — Encounter (HOSPITAL_COMMUNITY)
Admission: EM | Disposition: A | Discharge status: Home / Self Care | Payer: Self-pay | Source: Home / Self Care | Attending: Urology

## 2013-08-29 ENCOUNTER — Emergency Department (HOSPITAL_COMMUNITY): Payer: Medicare Other

## 2013-08-29 DIAGNOSIS — R319 Hematuria, unspecified: Secondary | ICD-10-CM | POA: Diagnosis present

## 2013-08-29 DIAGNOSIS — Z833 Family history of diabetes mellitus: Secondary | ICD-10-CM

## 2013-08-29 DIAGNOSIS — T839XXA Unspecified complication of genitourinary prosthetic device, implant and graft, initial encounter: Secondary | ICD-10-CM

## 2013-08-29 DIAGNOSIS — R338 Other retention of urine: Secondary | ICD-10-CM

## 2013-08-29 DIAGNOSIS — C61 Malignant neoplasm of prostate: Secondary | ICD-10-CM | POA: Diagnosis present

## 2013-08-29 DIAGNOSIS — Z8249 Family history of ischemic heart disease and other diseases of the circulatory system: Secondary | ICD-10-CM

## 2013-08-29 DIAGNOSIS — E785 Hyperlipidemia, unspecified: Secondary | ICD-10-CM | POA: Diagnosis present

## 2013-08-29 DIAGNOSIS — N179 Acute kidney failure, unspecified: Secondary | ICD-10-CM | POA: Diagnosis not present

## 2013-08-29 DIAGNOSIS — D62 Acute posthemorrhagic anemia: Secondary | ICD-10-CM

## 2013-08-29 DIAGNOSIS — N210 Calculus in bladder: Secondary | ICD-10-CM | POA: Diagnosis present

## 2013-08-29 DIAGNOSIS — N189 Chronic kidney disease, unspecified: Secondary | ICD-10-CM

## 2013-08-29 DIAGNOSIS — R339 Retention of urine, unspecified: Secondary | ICD-10-CM | POA: Diagnosis present

## 2013-08-29 DIAGNOSIS — N2589 Other disorders resulting from impaired renal tubular function: Secondary | ICD-10-CM

## 2013-08-29 DIAGNOSIS — I2510 Atherosclerotic heart disease of native coronary artery without angina pectoris: Secondary | ICD-10-CM | POA: Diagnosis present

## 2013-08-29 DIAGNOSIS — R310 Gross hematuria: Secondary | ICD-10-CM

## 2013-08-29 DIAGNOSIS — E875 Hyperkalemia: Secondary | ICD-10-CM | POA: Diagnosis present

## 2013-08-29 DIAGNOSIS — N184 Chronic kidney disease, stage 4 (severe): Secondary | ICD-10-CM | POA: Diagnosis present

## 2013-08-29 DIAGNOSIS — Z87891 Personal history of nicotine dependence: Secondary | ICD-10-CM

## 2013-08-29 DIAGNOSIS — N3289 Other specified disorders of bladder: Secondary | ICD-10-CM

## 2013-08-29 DIAGNOSIS — E119 Type 2 diabetes mellitus without complications: Secondary | ICD-10-CM

## 2013-08-29 DIAGNOSIS — I252 Old myocardial infarction: Secondary | ICD-10-CM

## 2013-08-29 DIAGNOSIS — I129 Hypertensive chronic kidney disease with stage 1 through stage 4 chronic kidney disease, or unspecified chronic kidney disease: Secondary | ICD-10-CM | POA: Diagnosis present

## 2013-08-29 DIAGNOSIS — C675 Malignant neoplasm of bladder neck: Principal | ICD-10-CM | POA: Diagnosis present

## 2013-08-29 HISTORY — PX: TRANSURETHRAL RESECTION OF PROSTATE: SHX73

## 2013-08-29 HISTORY — DX: Acute myocardial infarction, unspecified: I21.9

## 2013-08-29 LAB — COMPREHENSIVE METABOLIC PANEL
ALT: 8 U/L (ref 0–53)
AST: 13 U/L (ref 0–37)
Albumin: 3.4 g/dL — ABNORMAL LOW (ref 3.5–5.2)
Alkaline Phosphatase: 46 U/L (ref 39–117)
CO2: 20 mEq/L (ref 19–32)
Chloride: 104 mEq/L (ref 96–112)
Creatinine, Ser: 3.94 mg/dL — ABNORMAL HIGH (ref 0.50–1.35)
GFR calc Af Amer: 15 mL/min — ABNORMAL LOW (ref >90)
GFR calc non Af Amer: 13 mL/min — ABNORMAL LOW (ref >90)
Potassium: 5.3 mEq/L — ABNORMAL HIGH (ref 3.5–5.1)
Sodium: 140 mEq/L (ref 135–145)
Total Bilirubin: 0.4 mg/dL (ref 0.3–1.2)

## 2013-08-29 LAB — CBC
MCV: 91.7 fL (ref 78.0–100.0)
Platelets: 227 10*3/uL (ref 150–400)
RBC: 3.36 MIL/uL — ABNORMAL LOW (ref 4.22–5.81)
RDW: 14.2 % (ref 11.5–15.5)
WBC: 10.3 10*3/uL (ref 4.0–10.5)

## 2013-08-29 LAB — GLUCOSE, CAPILLARY: Glucose-Capillary: 191 mg/dL — ABNORMAL HIGH (ref 70–99)

## 2013-08-29 SURGERY — TURP (TRANSURETHRAL RESECTION OF PROSTATE)
Anesthesia: General | Site: Bladder

## 2013-08-29 MED ORDER — MEPERIDINE HCL 50 MG/ML IJ SOLN
6.2500 mg | INTRAMUSCULAR | Status: DC | PRN
  Administered 2013-08-29: 12.5 mg via INTRAVENOUS

## 2013-08-29 MED ORDER — OXYCODONE HCL 5 MG PO TABS: 5.0000 mg | ORAL_TABLET | Freq: Once | ORAL | Status: DC | PRN

## 2013-08-29 MED ORDER — BACITRACIN-NEOMYCIN-POLYMYXIN 400-5-5000 EX OINT
1.0000 "application " | TOPICAL_OINTMENT | Freq: Three times a day (TID) | CUTANEOUS | Status: DC | PRN
  Filled 2013-08-29: qty 1

## 2013-08-29 MED ORDER — VERAPAMIL HCL ER 120 MG PO TBCR
120.0000 mg | EXTENDED_RELEASE_TABLET | Freq: Every day | ORAL | Status: DC
  Administered 2013-08-30 – 2013-09-01 (×4): 120 mg via ORAL
  Filled 2013-08-29 (×6): qty 1

## 2013-08-29 MED ORDER — MEPERIDINE HCL 50 MG/ML IJ SOLN
INTRAMUSCULAR | Status: AC
  Filled 2013-08-29: qty 1

## 2013-08-29 MED ORDER — PROMETHAZINE HCL 25 MG/ML IJ SOLN: 6.2500 mg | INTRAMUSCULAR | Status: DC | PRN

## 2013-08-29 MED ORDER — PROPOFOL 10 MG/ML IV BOLUS
INTRAVENOUS | Status: DC | PRN
  Administered 2013-08-29: 150 mg via INTRAVENOUS

## 2013-08-29 MED ORDER — OXYCODONE HCL 5 MG/5ML PO SOLN
5.0000 mg | Freq: Once | ORAL | Status: DC | PRN
  Filled 2013-08-29: qty 5

## 2013-08-29 MED ORDER — METOPROLOL TARTRATE 50 MG PO TABS
100.0000 mg | ORAL_TABLET | Freq: Two times a day (BID) | ORAL | Status: DC
  Administered 2013-08-30 – 2013-09-02 (×8): 100 mg via ORAL
  Filled 2013-08-29 (×12): qty 2

## 2013-08-29 MED ORDER — BELLADONNA ALKALOIDS-OPIUM 16.2-60 MG RE SUPP: 1.0000 | Freq: Four times a day (QID) | RECTAL | Status: DC | PRN

## 2013-08-29 MED ORDER — LACTATED RINGERS IV SOLN: INTRAVENOUS | Status: DC

## 2013-08-29 MED ORDER — HYDROMORPHONE HCL PF 1 MG/ML IJ SOLN: 0.2500 mg | INTRAMUSCULAR | Status: DC | PRN

## 2013-08-29 MED ORDER — CIPROFLOXACIN IN D5W 400 MG/200ML IV SOLN
400.0000 mg | Freq: Once | INTRAVENOUS | Status: AC
  Administered 2013-08-29: 400 mg via INTRAVENOUS

## 2013-08-29 MED ORDER — ONDANSETRON HCL 4 MG/2ML IJ SOLN
INTRAMUSCULAR | Status: AC
  Filled 2013-08-29: qty 2

## 2013-08-29 MED ORDER — LIDOCAINE HCL 2 % EX GEL
CUTANEOUS | Status: DC | PRN
  Administered 2013-08-29: 1 via TOPICAL

## 2013-08-29 MED ORDER — OXYCODONE HCL 5 MG PO TABS
5.0000 mg | ORAL_TABLET | ORAL | Status: DC | PRN
  Administered 2013-08-30: 5 mg via ORAL
  Administered 2013-08-30 – 2013-08-31 (×2): 10 mg via ORAL
  Filled 2013-08-29: qty 2
  Filled 2013-08-29: qty 1
  Filled 2013-08-29: qty 2

## 2013-08-29 MED ORDER — CEFAZOLIN SODIUM-DEXTROSE 2-3 GM-% IV SOLR
INTRAVENOUS | Status: AC
  Filled 2013-08-29: qty 50

## 2013-08-29 MED ORDER — FENTANYL CITRATE 0.05 MG/ML IJ SOLN
INTRAMUSCULAR | Status: AC
  Filled 2013-08-29: qty 2

## 2013-08-29 MED ORDER — BELLADONNA ALKALOIDS-OPIUM 16.2-60 MG RE SUPP
RECTAL | Status: AC
Start: 2013-08-29 — End: 2013-08-29
  Filled 2013-08-29: qty 1

## 2013-08-29 MED ORDER — ACETAMINOPHEN 500 MG PO TABS
500.0000 mg | ORAL_TABLET | Freq: Four times a day (QID) | ORAL | Status: DC | PRN
  Filled 2013-08-29: qty 2

## 2013-08-29 MED ORDER — ONDANSETRON HCL 4 MG/2ML IJ SOLN
INTRAMUSCULAR | Status: DC | PRN
  Administered 2013-08-29: 4 mg via INTRAVENOUS

## 2013-08-29 MED ORDER — PROPOFOL 10 MG/ML IV BOLUS
INTRAVENOUS | Status: AC
  Filled 2013-08-29: qty 20

## 2013-08-29 MED ORDER — SODIUM CHLORIDE 0.45 % IV SOLN
INTRAVENOUS | Status: DC
  Administered 2013-08-29: 23:00:00 via INTRAVENOUS

## 2013-08-29 MED ORDER — NIACIN 500 MG PO TABS
500.0000 mg | ORAL_TABLET | Freq: Every day | ORAL | Status: DC
  Administered 2013-08-30 – 2013-09-01 (×4): 500 mg via ORAL
  Filled 2013-08-29 (×7): qty 1

## 2013-08-29 MED ORDER — BISACODYL 10 MG RE SUPP
10.0000 mg | Freq: Every day | RECTAL | Status: DC | PRN
  Filled 2013-08-29: qty 1

## 2013-08-29 MED ORDER — CIPROFLOXACIN HCL 250 MG PO TABS
250.0000 mg | ORAL_TABLET | Freq: Two times a day (BID) | ORAL | Status: DC
  Administered 2013-08-30 – 2013-08-31 (×3): 250 mg via ORAL
  Filled 2013-08-29 (×5): qty 1

## 2013-08-29 MED ORDER — CALCITRIOL 0.25 MCG PO CAPS
0.2500 ug | ORAL_CAPSULE | ORAL | Status: DC
  Administered 2013-09-01: 0.25 ug via ORAL
  Filled 2013-08-29: qty 1

## 2013-08-29 MED ORDER — SODIUM CHLORIDE 0.9 % IR SOLN
3000.0000 mL | Status: DC
  Administered 2013-08-30: 3000 mL

## 2013-08-29 MED ORDER — FENTANYL CITRATE 0.05 MG/ML IJ SOLN
INTRAMUSCULAR | Status: DC | PRN
  Administered 2013-08-29: 50 ug via INTRAVENOUS
  Administered 2013-08-29: 25 ug via INTRAVENOUS
  Administered 2013-08-29: 50 ug via INTRAVENOUS
  Administered 2013-08-29: 25 ug via INTRAVENOUS
  Administered 2013-08-29: 50 ug via INTRAVENOUS

## 2013-08-29 MED ORDER — PIOGLITAZONE HCL 30 MG PO TABS
30.0000 mg | ORAL_TABLET | Freq: Every day | ORAL | Status: DC
  Administered 2013-08-30 – 2013-09-02 (×4): 30 mg via ORAL
  Filled 2013-08-29 (×6): qty 1

## 2013-08-29 MED ORDER — FUROSEMIDE 40 MG PO TABS
40.0000 mg | ORAL_TABLET | Freq: Every morning | ORAL | Status: DC
  Administered 2013-08-30 – 2013-08-31 (×2): 40 mg via ORAL
  Filled 2013-08-29 (×3): qty 1

## 2013-08-29 MED ORDER — LIDOCAINE HCL (CARDIAC) 20 MG/ML IV SOLN
INTRAVENOUS | Status: AC
  Filled 2013-08-29: qty 5

## 2013-08-29 MED ORDER — MIDAZOLAM HCL 2 MG/2ML IJ SOLN
INTRAMUSCULAR | Status: AC
  Filled 2013-08-29: qty 2

## 2013-08-29 MED ORDER — ONDANSETRON HCL 4 MG PO TABS
4.0000 mg | ORAL_TABLET | Freq: Three times a day (TID) | ORAL | Status: DC | PRN
  Filled 2013-08-29: qty 1

## 2013-08-29 MED ORDER — MIDAZOLAM HCL 5 MG/5ML IJ SOLN
INTRAMUSCULAR | Status: DC | PRN
  Administered 2013-08-29 (×2): 1 mg via INTRAVENOUS

## 2013-08-29 MED ORDER — LIDOCAINE HCL (CARDIAC) 20 MG/ML IV SOLN
INTRAVENOUS | Status: DC | PRN
  Administered 2013-08-29: 30 mg via INTRAVENOUS

## 2013-08-29 MED ORDER — ZOLPIDEM TARTRATE 5 MG PO TABS: 5.0000 mg | ORAL_TABLET | Freq: Every evening | ORAL | Status: DC | PRN

## 2013-08-29 MED ORDER — PHENOL 1.4 % MT LIQD
1.0000 | OROMUCOSAL | Status: DC | PRN
  Filled 2013-08-29: qty 177

## 2013-08-29 MED ORDER — ALLOPURINOL 100 MG PO TABS
100.0000 mg | ORAL_TABLET | Freq: Every day | ORAL | Status: DC
  Administered 2013-08-30 – 2013-09-01 (×4): 100 mg via ORAL
  Filled 2013-08-29 (×7): qty 1

## 2013-08-29 MED ORDER — MENTHOL 3 MG MT LOZG
1.0000 | LOZENGE | OROMUCOSAL | Status: DC | PRN
  Filled 2013-08-29: qty 9

## 2013-08-29 MED ORDER — LIDOCAINE HCL 2 % EX GEL
CUTANEOUS | Status: AC
  Filled 2013-08-29: qty 10

## 2013-08-29 MED ORDER — LACTATED RINGERS IV SOLN
INTRAVENOUS | Status: DC | PRN
  Administered 2013-08-29: 20:00:00 via INTRAVENOUS

## 2013-08-29 MED ORDER — BELLADONNA ALKALOIDS-OPIUM 16.2-60 MG RE SUPP
RECTAL | Status: DC | PRN
  Administered 2013-08-29: 1 via RECTAL

## 2013-08-29 MED ORDER — CIPROFLOXACIN IN D5W 400 MG/200ML IV SOLN
INTRAVENOUS | Status: AC
  Filled 2013-08-29: qty 200

## 2013-08-29 MED ORDER — CEFAZOLIN SODIUM-DEXTROSE 2-3 GM-% IV SOLR
INTRAVENOUS | Status: DC | PRN
  Administered 2013-08-29: 2 g via INTRAVENOUS

## 2013-08-29 SURGICAL SUPPLY — 26 items
BAG URINE DRAINAGE (UROLOGICAL SUPPLIES) ×1 IMPLANT
BAG URO CATCHER STRL LF (DRAPE) ×2 IMPLANT
CARTRIDGE STONEBREAK CO2 KIDNE (ELECTROSURGICAL) ×1 IMPLANT
CATH FOLEY 3WAY 30CC 22FR (CATHETERS) IMPLANT
CATH FOLEY 3WAY 30CC 24FR (CATHETERS) ×2
CATH URTH STD 24FR FL 3W 2 (CATHETERS) IMPLANT
DRAPE CAMERA CLOSED 9X96 (DRAPES) ×2 IMPLANT
ELECT BUTTON HF 24-28F 2 30DE (ELECTRODE) IMPLANT
ELECT LOOP MED HF 24F 12D (CUTTING LOOP) ×1 IMPLANT
ELECT LOOP MED HF 24F 12D CBL (CLIP) ×1 IMPLANT
ELECT REM PT RETURN 9FT ADLT (ELECTROSURGICAL)
ELECT RESECT VAPORIZE 12D CBL (ELECTRODE) IMPLANT
ELECTRODE REM PT RTRN 9FT ADLT (ELECTROSURGICAL) ×1 IMPLANT
EVACUATOR MICROVAS BLADDER (UROLOGICAL SUPPLIES) ×1 IMPLANT
GLOVE BIO SURGEON STRL SZ7.5 (GLOVE) ×2 IMPLANT
GOWN PREVENTION PLUS XLARGE (GOWN DISPOSABLE) ×2 IMPLANT
GOWN STRL REIN XL XLG (GOWN DISPOSABLE) ×2 IMPLANT
HOLDER FOLEY CATH W/STRAP (MISCELLANEOUS) IMPLANT
KIT ASPIRATION TUBING (SET/KITS/TRAYS/PACK) ×2 IMPLANT
LOOPS RESECTOSCOPE DISP (ELECTROSURGICAL) IMPLANT
MANIFOLD NEPTUNE II (INSTRUMENTS) ×2 IMPLANT
PACK CYSTO (CUSTOM PROCEDURE TRAY) ×2 IMPLANT
PROBE PNEUMATIC 1.6MM (ELECTROSURGICAL) ×1 IMPLANT
SYR 30ML LL (SYRINGE) ×1 IMPLANT
SYRINGE IRR TOOMEY STRL 70CC (SYRINGE) ×2 IMPLANT
TUBING CONNECTING 10 (TUBING) ×2 IMPLANT

## 2013-08-29 NOTE — ED Provider Notes (Signed)
MSE was initiated and I personally evaluated the patient and placed orders (if any) at  12:34 PM on August 29, 2013.  The patient appears stable so that the remainder of the MSE may be completed by another provider. Pt seen by dr Annabell Howells, will take to OR.  Pt currently stable   Joya Gaskins, MD 08/29/13 1234

## 2013-08-29 NOTE — Transfer of Care (Signed)
Immediate Anesthesia Transfer of Care Note  Patient: Samuel Garner  Procedure(s) Performed: Procedure(s) (LRB): TRANSURETHRAL RESECTION OF THE PROSTATE (TURP) and Clot evacuation (N/A)  Patient Location: PACU  Anesthesia Type: General  Level of Consciousness: sedated, patient cooperative and responds to stimulation  Airway & Oxygen Therapy: Patient Spontanous Breathing and Patient connected to face mask oxgen  Post-op Assessment: Report given to PACU RN and Post -op Vital signs reviewed and stable  Post vital signs: Reviewed and stable  Complications: No apparent anesthesia complications

## 2013-08-29 NOTE — Consult Note (Signed)
Subjective: Samuel Garner is a 79 yo WM who I was asked to see in consultation by Samuel Garner for gross hematuria and a bladder mass seen on CT on 12/5.  He was to come to the office but had to go to the ER for an obstructed foley.   He had the onset of hematuria 4-5 weeks ago.   It was heavy for 2-3 days and then it cleared.   He saw Samuel Garner who got a CT that showed a 6.7cm mass at the base of the bladder.   On my review of the films, I think he probably has a prostate cancer.   He went into clot retention yesterday and had a foley placed but it blocked so he had a 22fr placed which worked until today but has now obstructed and he is having pain.   ROS: Negative except as noted above and he denies fever, CP, SOB on a full review.   Past Medical History  Diagnosis Date  . Diabetes mellitus, type 2     with microalbuminuria  . Hypertension   . Arteriosclerotic cardiovascular disease (ASCVD)      2 vessel percutaneous transluminal coronary angioplasty in 3/93  . Hyperlipidemia     low HDL, high triglycerides  . Tobacco abuse, in remission     discontinued in 1993  . Gout   . Chronic kidney disease     creatinine 1.8 in 2002, 1.6 1n 2007 and 1.0 and 2008; deterioration in renal function; Secondary hyperparathyroidism  . Type IV renal tubular acidosis     With hyperkalemia   Past Surgical History  Procedure Laterality Date  . Hernia repair    . Coronary angioplasty  1993  . Knee arthroscopy     History   Social History  . Marital Status: Married    Spouse Name: N/A    Number of Children: 2  . Years of Education: N/A   Occupational History  . Retired     Soil scientist   Social History Main Topics  . Smoking status: Former Smoker -- 1.00 packs/day for 40 years    Quit date: 09/12/1991  . Smokeless tobacco: Not on file  . Alcohol Use: No  . Drug Use: No  . Sexual Activity: Not on file   Other Topics Concern  . Not on file   Social History Narrative  . No narrative on file    Family History  Problem Relation Age of Onset  . Diabetes Mother   . Heart attack Father    Allergies  Allergen Reactions  . Lisinopril     REACTION: acute renal failure: Patient is not familiar with this medication or an allergy     Objective: Vital signs in last 24 hours: Temp:  [97.6 F (36.4 C)] 97.6 F (36.4 C) (12/18 1750) Pulse Rate:  [69] 69 (12/18 1750) Resp:  [18] 18 (12/18 1750) BP: (149)/(59) 149/59 mmHg (12/18 1750) SpO2:  [98 %] 98 % (12/18 1750) Weight:  [104.327 kg (230 lb)] 104.327 kg (230 lb) (12/18 1750)  Intake/Output from previous day:   Intake/Output this shift:    General appearance: alert and no distress Head: Normocephalic, without obvious abnormality, atraumatic Neck: no adenopathy, no carotid bruit, no JVD, supple, symmetrical, trachea midline and thyroid not enlarged, symmetric, no tenderness/mass/nodules Resp: clear to auscultation bilaterally Cardio: regular rate and rhythm GI: soft, obese with a tender suprapubic mass from distended bladder.   He has no hernias or HSM.  Male genitalia: normal, but uncircumcised with blood at the meatus.   Prostate is 2+ with mild induration.   No rectal masses are noted.  Extremities: FROM with mild edema in the legs Skin: Skin color, texture, turgor normal. No rashes or lesions Lymph nodes: Cervical, supraclavicular, and axillary nodes normal. Neurologic: Alert and oriented X 3, normal strength and tone. Normal symmetric reflexes. Normal coordination and gait  CT films and report reviewed.   He has a large mass at the bladder base that is consistent with prostate cancer.    Lab Results:   Recent Labs  08/28/13 1940  WBC 6.7  HGB 10.0*  HCT 30.9*  PLT 205   BMET  Recent Labs  08/28/13 1940  NA 140  K 5.7*  CL 105  CO2 22  GLUCOSE 156*  BUN 66*  CREATININE 3.73*  CALCIUM 9.9   PT/INR No results found for this basename: LABPROT, INR,  in the last 72 hours ABG No results found for  this basename: PHART, PCO2, PO2, HCO3,  in the last 72 hours  Studies/Results: No results found.  Anti-infectives: Anti-infectives   None      No current facility-administered medications for this encounter.   Current Outpatient Prescriptions  Medication Sig Dispense Refill  . acetaminophen (TYLENOL) 500 MG tablet Take 500-1,500 mg by mouth every 6 (six) hours as needed.      Marland Kitchen allopurinol (ZYLOPRIM) 100 MG tablet Take 100 mg by mouth at bedtime.       . calcitRIOL (ROCALTROL) 0.25 MCG capsule Take 0.25 mcg by mouth every Monday, Wednesday, and Friday.       . Cholecalciferol (VITAMIN D3) 1000 UNITS CAPS Take 1,000 Units by mouth every morning.       . ciprofloxacin (CIPRO) 250 MG tablet Take 1 tablet (250 mg total) by mouth 2 (two) times daily.  14 tablet  0  . fenofibrate micronized (ANTARA) 43 MG capsule Take 43 mg by mouth every morning.       . furosemide (LASIX) 40 MG tablet Take 40 mg by mouth every morning.      . metoprolol (LOPRESSOR) 100 MG tablet Take 100 mg by mouth 2 (two) times daily.      . Multiple Vitamins-Minerals (PRESERVISION/LUTEIN) CAPS Take 1 capsule by mouth at bedtime.       . niacin 500 MG tablet Take 500 mg by mouth at bedtime.      . Omega-3 Fatty Acids (FISH OIL) 1200 MG CAPS Take 1 capsule by mouth 2 (two) times daily.      Marland Kitchen oxybutynin (DITROPAN XL) 5 MG 24 hr tablet Take 1 tablet (5 mg total) by mouth at bedtime.  30 tablet  0  . pioglitazone (ACTOS) 30 MG tablet Take 30 mg by mouth every morning.       . pravastatin (PRAVACHOL) 40 MG tablet Take 40 mg by mouth at bedtime.      . verapamil (CALAN-SR) 120 MG CR tablet Take 1 tablet (120 mg total) by mouth at bedtime.  30 tablet  11    Assessment: Clot retention probably from a prostate cancer.   Plan: I replaced his foley with a 23fr and irrigated the bladder with return of a large amount of clot but I couldn't get him clear and he appears to be actively bleeding.      He is going to need to go to  the  OR today for cystoscopy with clot evac, biopsy and fulguration of bleeders.  I have reviewed the risks of the procedure.    CC: Samuel Garner     LOS: 0 days    Samuel Garner 08/29/2013

## 2013-08-29 NOTE — ED Notes (Signed)
Spoke with the PACU and they advised they could not accept the patient until 1630 at the earliest. Patient and family made aware of situation

## 2013-08-29 NOTE — ED Notes (Signed)
Patient sent in for problems with indwelling catheter. Patient states catheter is not draining, experiencing discomfort and feeling of urgency, catheter bag contains dark red urine with clots

## 2013-08-29 NOTE — ED Notes (Signed)
[facility] en route. Called [facility] at [facility] and they advised they could not take the patient until 1900. Called [facility] and spoke with the charge nurse. Patient beginning to feel discomfort. Bladder irrigated and clots expressed. [facility] here for transport and patient will be going to ED. Accepted previously by Dr Effie Shy.

## 2013-08-29 NOTE — Op Note (Signed)
Preoperative diagnosis:  1. Gross hematuria and clot retention   Postoperative diagnosis:  1. Large mass protruding from posterior bladder neck-likely prostatic in origin   Procedure: 1. Cystoscopy 2. Clot evacuation 3. TURP of the median lobe/bladder neck mass  Surgeon: Crist Fat, MD  Anesthesia: General  Complications: None  Intraoperative findings: Large obstructing prostate with irregular lobulated lateral lobes and a large median lobe extending into the posterior bladder neck. 500-700 cc of blood clot was evacuated. There was a also a jack stone in the patient's bladder which was evacuated through the resectoscope sheath without requiring fragmentation. The median lobe was taken down at the bladder neck to the floor of the bladder from 3:00 to 9:00 in a clockwise fashion, and all bleeding was fulgurated. A 24 French three-way Foley catheter was placed, and the bladder irrigant and was clear at the end of the case.  EBL: Minimal  Specimens: None  Indication: Samuel Garner is a 79 y.o. patient was transferred from [facility] emergency room this afternoon with clot retention. A CT scan obtained 2 weeks prior showed a large mass on the posterior for floor of the bladder suspicious for a large prostatic median lobe/prostate cancer.  After reviewing the management options for treatment, he elected to proceed with the above surgical procedure(s). We have discussed the potential benefits and risks of the procedure, side effects of the proposed treatment, the likelihood of the patient achieving the goals of the procedure, and any potential problems that might occur during the procedure or recuperation. Informed consent has been obtained.  Description of procedure:  The patient was taken to the operating room and general anesthesia was induced.  The patient was placed in the dorsal lithotomy position, prepped and draped in the usual sterile fashion, and preoperative antibiotics were  administered. A preoperative time-out was performed.   I then inserted a 26 French resectoscope sheath into the patient's bladder using the visual obturator and a 12.5 lens. Once into the bladder I used a [place] syringe to irrigate a substantial amount of clot. This was likely totaling between 500-700 cc of blood clot. Once the clot had been evacuated I resected the median lobe of the prostate down to the bladder neck/posterior bladder wall. This was done in a stepwise fashion beginning at 3:00 position and resecting clockwise to the 9:00 position. Once the tumor was resected adequately and all bleeding appeared to be stopped the prostate chips were evacuated and a 24 French three-way Foley catheter was inserted into the patient's bladder. 30 cc of sterile water was instilled into the catheter balloon. The catheter was then he regained it by hand several times with clear bladder irrigant efflux. Notably, at the beginning of the resection I did note a jack stone bladder calcification. This was irrigated out through the 26 [place] sheath and did not require fragmentation. A exam under anesthesia was performed at the end of the case and the prostate was noted to be firm and irregular most notably on the patient's right side. Prior to inserting the Foley catheter I did inject 10 cc of lidocaine jelly into the patient's urethra. At the end of the case we also placed a B&O suppository into the patient's rectum.  Disposition: The patient tolerated the procedure well. he was subsequently extubated and returned to the PACU in stable condition. Catheter was draining clear with a moderate CBI drip.  Crist Fat, M.D.

## 2013-08-29 NOTE — ED Provider Notes (Signed)
After discussion with dr Annabell Howells, pt will be transferred to [facility] for probable cystoscopy He is arranging with urology team to go to OR today D/w dr Effie Shy in the ER at [facility] informed of plan   Joya Gaskins, MD 08/29/13 1310

## 2013-08-29 NOTE — ED Notes (Signed)
Patient is going to [facility] for surgical procedure

## 2013-08-29 NOTE — Preoperative (Addendum)
Beta Blockers   Reason not to administer Beta Blockers:Not Applicable, Took lopresor this am

## 2013-08-29 NOTE — ED Notes (Signed)
Report given to [facility]  

## 2013-08-29 NOTE — Anesthesia Preprocedure Evaluation (Addendum)
Anesthesia Evaluation  Patient identified by MRN, date of birth, ID band Patient awake    Reviewed: Allergy & Precautions, H&P , NPO status , Patient's Chart, lab work & pertinent test results, reviewed documented beta blocker date and time   Airway Mallampati: II TM Distance: >3 FB Neck ROM: Full    Dental  (+) Dental Advisory Given   Pulmonary former smoker,          Cardiovascular hypertension, Pt. on medications and Pt. on home beta blockers + CAD and + Past MI Rhythm:Regular Rate:Normal     Neuro/Psych negative neurological ROS  negative psych ROS   GI/Hepatic negative GI ROS, Neg liver ROS,   Endo/Other  negative endocrine ROSdiabetes, Type 2, Oral Hypoglycemic Agents  Renal/GU CRF and Renal InsufficiencyRenal disease  negative genitourinary   Musculoskeletal negative musculoskeletal ROS (+)   Abdominal   Peds negative pediatric ROS (+)  Hematology  (+) Blood dyscrasia, anemia ,   Anesthesia Other Findings   Reproductive/Obstetrics negative OB ROS                          Anesthesia Physical Anesthesia Plan  ASA: III and emergent  Anesthesia Plan: General   Post-op Pain Management:    Induction: Intravenous  Airway Management Planned: LMA  Additional Equipment:   Intra-op Plan:   Post-operative Plan: Extubation in OR  Informed Consent: I have reviewed the patients History and Physical, chart, labs and discussed the procedure including the risks, benefits and alternatives for the proposed anesthesia with the patient or authorized representative who has indicated his/her understanding and acceptance.   Dental advisory given  Plan Discussed with: CRNA  Anesthesia Plan Comments:         Anesthesia Quick Evaluation

## 2013-08-30 LAB — BASIC METABOLIC PANEL
BUN: 71 mg/dL — ABNORMAL HIGH (ref 6–23)
CO2: 18 mEq/L — ABNORMAL LOW (ref 19–32)
Chloride: 108 mEq/L (ref 96–112)
GFR calc Af Amer: 14 mL/min — ABNORMAL LOW (ref >90)
GFR calc non Af Amer: 12 mL/min — ABNORMAL LOW (ref >90)
Potassium: 5.3 mEq/L — ABNORMAL HIGH (ref 3.5–5.1)

## 2013-08-30 LAB — CBC
HCT: 23 % — ABNORMAL LOW (ref 39.0–52.0)
Hemoglobin: 7.7 g/dL — ABNORMAL LOW (ref 13.0–17.0)
MCHC: 33.5 g/dL (ref 30.0–36.0)
MCV: 89.8 fL (ref 78.0–100.0)
RBC: 2.56 MIL/uL — ABNORMAL LOW (ref 4.22–5.81)
RDW: 14.3 % (ref 11.5–15.5)
WBC: 10.3 10*3/uL (ref 4.0–10.5)

## 2013-08-30 LAB — URINE CULTURE

## 2013-08-30 LAB — GLUCOSE, CAPILLARY
Glucose-Capillary: 187 mg/dL — ABNORMAL HIGH (ref 70–99)
Glucose-Capillary: 172 mg/dL — ABNORMAL HIGH (ref 70–99)

## 2013-08-30 NOTE — Progress Notes (Signed)
Urology Inpatient Progress Report  Clot evac/TURP 08/29/13  Intv/Subj: No acute events overnight. Patient is without complaint.  Objective: Vital: Filed Vitals:   08/29/13 2236 08/29/13 2245 08/29/13 2258 08/30/13 0458  BP: 118/63 121/59  115/55  Pulse:  91  84  Temp: 99 F (37.2 C) 98.9 F (37.2 C)  98.8 F (37.1 C)  TempSrc:    Oral  Resp:    20  Height:   5\' 9"  (1.753 m)   Weight:   107.5 kg (236 lb 15.9 oz)   SpO2: 100% 99%  98%   I/Os: I/O last 3 completed shifts: In: 4761.7 [I.V.:1161.7; Other:3600] Out: 5100 [Urine:5100]  Past Medical History  Diagnosis Date  . Diabetes mellitus, type 2     with microalbuminuria  . Hypertension   . Arteriosclerotic cardiovascular disease (ASCVD)      2 vessel percutaneous transluminal coronary angioplasty in 3/93  . Hyperlipidemia     low HDL, high triglycerides  . Tobacco abuse, in remission     discontinued in 1993  . Gout   . Chronic kidney disease     creatinine 1.8 in 2002, 1.6 1n 2007 and 1.0 and 2008; deterioration in renal function; Secondary hyperparathyroidism  . Type IV renal tubular acidosis     With hyperkalemia  . Myocardial infarction     1993   Current Facility-Administered Medications  Medication Dose Route Frequency Provider Last Rate Last Dose  . 0.45 % sodium chloride infusion   Intravenous Continuous Crist Fat, MD 100 mL/hr at 08/29/13 2323    . acetaminophen (TYLENOL) tablet 500-1,000 mg  500-1,000 mg Oral Q6H PRN Crist Fat, MD      . allopurinol (ZYLOPRIM) tablet 100 mg  100 mg Oral QHS Crist Fat, MD   100 mg at 08/30/13 0001  . bisacodyl (DULCOLAX) suppository 10 mg  10 mg Rectal Daily PRN Crist Fat, MD      . Melene Muller ON 09/01/2013] calcitRIOL (ROCALTROL) capsule 0.25 mcg  0.25 mcg Oral Q M,W,F Crist Fat, MD      . ciprofloxacin (CIPRO) tablet 250 mg  250 mg Oral BID Crist Fat, MD   250 mg at 08/30/13 0850  . furosemide (LASIX) tablet 40 mg  40  mg Oral q morning - 10a Crist Fat, MD      . menthol-cetylpyridinium (CEPACOL) lozenge 3 mg  1 lozenge Oral PRN Crist Fat, MD      . metoprolol (LOPRESSOR) tablet 100 mg  100 mg Oral BID Crist Fat, MD   100 mg at 08/30/13 0000  . neomycin-bacitracin-polymyxin (NEOSPORIN) ointment 1 application  1 application Topical TID PRN Crist Fat, MD      . niacin tablet 500 mg  500 mg Oral QHS Crist Fat, MD   500 mg at 08/30/13 0000  . ondansetron (ZOFRAN) tablet 4 mg  4 mg Oral Q8H PRN Crist Fat, MD      . opium-belladonna (B&O SUPPRETTES) suppository 1 suppository  1 suppository Rectal Q6H PRN Crist Fat, MD      . oxyCODONE (Oxy IR/ROXICODONE) immediate release tablet 5-10 mg  5-10 mg Oral Q4H PRN Crist Fat, MD   5 mg at 08/30/13 0002  . phenol (CHLORASEPTIC) mouth spray 1 spray  1 spray Mouth/Throat PRN Crist Fat, MD      . pioglitazone (ACTOS) tablet 30 mg  30 mg Oral Q breakfast Crist Fat, MD  30 mg at 08/30/13 0850  . sodium chloride irrigation 0.9 % 3,000 mL  3,000 mL Irrigation Continuous Crist Fat, MD   3,000 mL at 08/30/13 0201  . verapamil (CALAN-SR) CR tablet 120 mg  120 mg Oral QHS Crist Fat, MD   120 mg at 08/30/13 0000  . zolpidem (AMBIEN) tablet 5 mg  5 mg Oral QHS PRN Crist Fat, MD        Physical Exam:  General: Patient is in no apparent distress Lungs: Normal respiratory effort, chest expands symmetrically. GI: The abdomen is soft and nontender without mass. Ext: lower extremities symmetric  Lab Results:  Recent Labs  08/28/13 1940 08/29/13 1250 08/30/13 0510  WBC 6.7 10.3 10.3  HGB 10.0* 9.8* 7.7*  HCT 30.9* 30.8* 23.0*    Recent Labs  08/28/13 1940 08/29/13 1250 08/30/13 0510  NA 140 140 139  K 5.7* 5.3* 5.3*  CL 105 104 108  CO2 22 20 18*  GLUCOSE 156* 259* 210*  BUN 66* 70* 71*  CREATININE 3.73* 3.94* 4.30*  CALCIUM 9.9 10.1 8.8   No results  found for this basename: LABPT, INR,  in the last 72 hours No results found for this basename: LABURIN,  in the last 72 hours No results found for this or any previous visit.  Studies/Results: none  Assessment: 1 Day Post-Op, urine is clear on slow CBI.  Creatinine elevated, baseline renal insufficiency.  No complaints of flank pain, he is making urine, however, will need a renal ultrasound if his creatinine continues to rise.  Plan: Wean CBI to off Repeat labs in AM Continue cipro until culture from 12/18 returns Ambulate Regular diet.  Berniece Salines W 08/30/2013, 10:39 AM

## 2013-08-31 ENCOUNTER — Ambulatory Visit (HOSPITAL_COMMUNITY): Payer: Medicare Other

## 2013-08-31 LAB — CBC
HCT: 21.3 % — ABNORMAL LOW (ref 39.0–52.0)
Hemoglobin: 7 g/dL — ABNORMAL LOW (ref 13.0–17.0)
MCH: 29.9 pg (ref 26.0–34.0)
RDW: 14.3 % (ref 11.5–15.5)
WBC: 10.6 10*3/uL — ABNORMAL HIGH (ref 4.0–10.5)

## 2013-08-31 LAB — BASIC METABOLIC PANEL
BUN: 75 mg/dL — ABNORMAL HIGH (ref 6–23)
Chloride: 107 mEq/L (ref 96–112)
GFR calc Af Amer: 12 mL/min — ABNORMAL LOW (ref >90)
GFR calc non Af Amer: 11 mL/min — ABNORMAL LOW (ref >90)
Glucose, Bld: 217 mg/dL — ABNORMAL HIGH (ref 70–99)
Potassium: 5.5 mEq/L — ABNORMAL HIGH (ref 3.5–5.1)

## 2013-08-31 LAB — GLUCOSE, CAPILLARY: Glucose-Capillary: 198 mg/dL — ABNORMAL HIGH (ref 70–99)

## 2013-08-31 MED ORDER — SODIUM CHLORIDE 0.9 % IV SOLN
INTRAVENOUS | Status: DC
  Administered 2013-08-31 – 2013-09-01 (×2): via INTRAVENOUS

## 2013-08-31 MED ORDER — FUROSEMIDE 10 MG/ML IJ SOLN: 20.0000 mg | Freq: Once | INTRAMUSCULAR | Status: DC

## 2013-08-31 MED ORDER — INSULIN ASPART 100 UNIT/ML ~~LOC~~ SOLN
0.0000 [IU] | Freq: Three times a day (TID) | SUBCUTANEOUS | Status: DC
  Administered 2013-08-31 – 2013-09-01 (×3): 3 [IU] via SUBCUTANEOUS
  Administered 2013-09-01: 18:00:00 2 [IU] via SUBCUTANEOUS
  Administered 2013-09-01 – 2013-09-02 (×2): 3 [IU] via SUBCUTANEOUS

## 2013-08-31 MED ORDER — SODIUM CHLORIDE 0.9 % IV SOLN: INTRAVENOUS | Status: DC

## 2013-08-31 MED ORDER — INSULIN ASPART 100 UNIT/ML ~~LOC~~ SOLN: 0.0000 [IU] | Freq: Every day | SUBCUTANEOUS | Status: DC

## 2013-08-31 MED ORDER — FUROSEMIDE 10 MG/ML IJ SOLN
20.0000 mg | Freq: Once | INTRAMUSCULAR | Status: AC
  Administered 2013-08-31: 17:00:00 20 mg via INTRAVENOUS
  Filled 2013-08-31: qty 2

## 2013-08-31 NOTE — Consult Note (Signed)
[facility] Medical Consultation  [name] AVW:[id number] DOB: Jun 12, 1933 DOA: 08/29/2013 PCP: Catalina Pizza, MD   Requesting physician: Dr Dub Amis Date of consultation: 08/31/13 Reason for consultation: Anemia, Acute Renal Failure  Impression/Recommendations Principal Problem:   Gross hematuria Active Problems:   Acute urinary retention   Bladder mass   Hematuria    1. Acute blood loss anemia. Labs reviewed, patient's hemoglobin trended down from 10.0 on 08/30/2013 to 7.0 this mornings lab work. He initially presented with complaints of gross hematuria undergoing cystoscopy during this hospitalization and continuous bladder irrigation. I suspect that anemia is secondary to blood loss from urinary tract. I recommend type and cross him and transfuse him 2 units of packed red blood cells, followup on repeat H&H post transfusion. Urine in his Foley bag did not appear to be grossly bloody. 2. Acute on chronic kidney disease. Patient with history of stage III/IV chronic kidney disease. He had a creatinine of 2.39 on 01/20/2013, presented with creatinine of 3.73 on 08/28/2013. I suspect he may be volume depleted, which may have resulted from the loss of a genitourinary source. Plan to just use her with 2 units of packed red blood cells, change IV fluid to half-normal to normal saline for volume replacement. Patient had also been on Lasix which has been discontinued as I suspect is contributed to bone depletion. 3. Type 2 diabetes mellitus. Patient's blood sugars in the 200 range, would recommend placing him on sliding scale coverage and Accu-Cheks q. a.c. each bedtime.  I will followup again tomorrow. Please contact me if I can be of assistance in the meanwhile. Thank you for this consultation.  Chief Complaint: Hematuria  HPI:  Patient is a pleasant 79 year old gentleman with a past medical history of type 2 diabetes mellitus, hypertension, dyslipidemia, type IV renal tubular  acidosis with history of hyperkalemia  who presented to the ER on 08/28/2013 with a three-week history of gross hematuria, that have become worse over the past week. In the 12 hours prior to presentation he had reported having difficulty urinating feeling his abdomen to be distended with associated pain. A CT scan of abdomen and pelvis performed on 08/15/2013 revealed a 6.7 x 4.7 cm irregular mass posteriorly and inferiorly in the urinary bladder concerning for neoplasm. He was also found to have enlarged right external iliac lymph nodes concerning for metastatic disease. Bladder ultrasound on presentation showed mass with distended bladder, as a Foley catheter was placed in the emergency department, draining approximately 600 mL's. He was discharged from the emergency department to followup with urology as an outpatient. He was seen by Dr. Marlou Porch on 08/29/2013 for obstructive Foley catheter that was replaced. On 08/29/2013 he underwent cystoscopy with clot evacuation as well asTURP, procedure performed by Dr. Marlou Porch. Post procedure he was placed on continuous bladder irrigation. During this hospitalization he has had a gradual upward trend and creatinine going from 3.73 on 08/28/2013   to 4.76 on 08/31/2013. His hemoglobin has also trended down from 10.0 on 08/28/2013 to 7.0 on 08/31/2013. Patient's yesterday ambulated down the hallway and back, today reports her having much of an appetite. He denies nausea vomiting fevers or chills. States having mild lower abdominal discomfort.  Review of Systems:  Constitutional:  No weight loss  HEENT:  No headaches, Difficulty swallowing,Tooth/dental problems  No sneezing, itching, ear ache, nasal congestion, post nasal drip,  Cardio-vascular:  No chest pain, Orthopnea, PND, swelling in lower extremities, anasarca, dizziness, palpitations  GI:  No heartburn, indigestion, abdominal pain, nausea,  vomiting, diarrhea, change in bowel habits, loss of appetite  Resp:  No shortness of breath with exertion or at rest. No excess mucus, no productive cough, No non-productive cough, No coughing up of blood.No change in color of mucus.No wheezing.No chest wall deformity  Skin:  Significant erythema induration and swelling to anterior aspect of neck.  GU:  No flank pain.  Musculoskeletal:  No joint pain or swelling. No decreased range of motion. No back pain.  Psych:  No change in mood or affect. No depression or anxiety. No memory loss.   Past Medical History  Diagnosis Date  . Diabetes mellitus, type 2     with microalbuminuria  . Hypertension   . Arteriosclerotic cardiovascular disease (ASCVD)      2 vessel percutaneous transluminal coronary angioplasty in 3/93  . Hyperlipidemia     low HDL, high triglycerides  . Tobacco abuse, in remission     discontinued in 1993  . Gout   . Chronic kidney disease     creatinine 1.8 in 2002, 1.6 1n 2007 and 1.0 and 2008; deterioration in renal function; Secondary hyperparathyroidism  . Type IV renal tubular acidosis     With hyperkalemia  . Myocardial infarction     1993   Past Surgical History  Procedure Laterality Date  . Hernia repair    . Coronary angioplasty  1993  . Knee arthroscopy     Social History:  reports that he quit smoking about 21 years ago. He does not have any smokeless tobacco history on file. He reports that he does not drink alcohol or use illicit drugs.  Allergies  Allergen Reactions  . Lisinopril     REACTION: acute renal failure: Patient is not familiar with this medication or an allergy   Family History  Problem Relation Age of Onset  . Diabetes Mother   . Heart attack Father     Prior to Admission medications   Medication Sig Start Date End Date Taking? Authorizing Provider  acetaminophen (TYLENOL) 500 MG tablet Take 500-1,500 mg by mouth every 6 (six) hours as needed.   Yes Historical Provider, MD   allopurinol (ZYLOPRIM) 100 MG tablet Take 100 mg by mouth at bedtime.    Yes Historical Provider, MD  calcitRIOL (ROCALTROL) 0.25 MCG capsule Take 0.25 mcg by mouth every Monday, Wednesday, and Friday.    Yes Historical Provider, MD  Cholecalciferol (VITAMIN D3) 1000 UNITS CAPS Take 1,000 Units by mouth every morning.    Yes Historical Provider, MD  fenofibrate micronized (ANTARA) 43 MG capsule Take 43 mg by mouth every morning.  01/08/13  Yes Historical Provider, MD  furosemide (LASIX) 40 MG tablet Take 40 mg by mouth every morning.   Yes Historical Provider, MD  metoprolol (LOPRESSOR) 100 MG tablet Take 100 mg by mouth 2 (two) times daily.   Yes Historical Provider, MD  Multiple Vitamins-Minerals (PRESERVISION/LUTEIN) CAPS Take 1 capsule by mouth at bedtime.    Yes Historical Provider, MD  niacin 500 MG tablet Take 500 mg by mouth at bedtime.   Yes Historical Provider, MD  Omega-3 Fatty Acids (FISH OIL) 1200 MG CAPS Take 1 capsule by mouth 2 (two) times daily.  Yes Historical Provider, MD  pioglitazone (ACTOS) 30 MG tablet Take 30 mg by mouth every morning.    Yes Historical Provider, MD  pravastatin (PRAVACHOL) 40 MG tablet Take 40 mg by mouth at bedtime.   Yes Historical Provider, MD  verapamil (CALAN-SR) 120 MG CR tablet Take 1 tablet (120 mg total) by mouth at bedtime. 12/19/12  Yes Kathlen Brunswick, MD  ciprofloxacin (CIPRO) 250 MG tablet Take 1 tablet (250 mg total) by mouth 2 (two) times daily. 08/28/13   Courtney Paris, MD  oxybutynin (DITROPAN XL) 5 MG 24 hr tablet Take 1 tablet (5 mg total) by mouth at bedtime. 08/28/13   Courtney Paris, MD   Physical Exam: Blood pressure 114/56, pulse 81, temperature 99 F (37.2 C), temperature source Oral, resp. rate 20, height 5\' 9"  (1.753 m), weight 107.5 kg (236 lb 15.9 oz), SpO2 97.00%. Filed Vitals:   08/31/13 0611  BP: 114/56  Pulse: 81  Temp: 99 F (37.2 C)  Resp: 20     General:  Patient is in no acute distress, he is sitting  comfortably, calm cooperative.  Eyes: Pupils are equal round and reactive to light extraocular movement is intact  ENT: Dry oral mucosa, neck supple symmetrical no jugular venous stent  Cardiovascular: Regular rate and rhythm normal S1-S2 no murmurs rubs or gallop  Respiratory: Lungs are clear to auscultation bilaterally no wheezing rhonchi or rales  Abdomen: Patient reporting mild lower abdominal pain with palpation otherwise no rebound tenderness guarding or peritoneal signs.  Skin: No rashes or lesions noted  Musculoskeletal: Patient having trace edema to lower extremities bilaterally, as her range of motion to all extremities  Psychiatric: Awake alert and oriented x3  Neurologic: Cranial nerves 2-12 grossly intact no alteration to sensation global 5 of 5 muscle strength  Labs on Admission:  Basic Metabolic Panel:  Recent Labs Lab 08/28/13 1940 08/29/13 1250 08/30/13 0510 08/31/13 0929  NA 140 140 139 136  K 5.7* 5.3* 5.3* 5.5*  CL 105 104 108 107  CO2 22 20 18* 19  GLUCOSE 156* 259* 210* 217*  BUN 66* 70* 71* 75*  CREATININE 3.73* 3.94* 4.30* 4.76*  CALCIUM 9.9 10.1 8.8 8.8   Liver Function Tests:  Recent Labs Lab 08/28/13 1940 08/29/13 1250  AST 13 13  ALT 9 8  ALKPHOS 47 46  BILITOT 0.3 0.4  PROT 7.1 7.1  ALBUMIN 3.5 3.4*   No results found for this basename: LIPASE, AMYLASE,  in the last 168 hours No results found for this basename: AMMONIA,  in the last 168 hours CBC:  Recent Labs Lab 08/28/13 1940 08/29/13 1250 08/30/13 0510 08/31/13 0929  WBC 6.7 10.3 10.3 10.6*  HGB 10.0* 9.8* 7.7* 7.0*  HCT 30.9* 30.8* 23.0* 21.3*  MCV 92.0 91.7 89.8 91.0  PLT 205 227 179 154   Cardiac Enzymes: No results found for this basename: CKTOTAL, CKMB, CKMBINDEX, TROPONINI,  in the last 168 hours BNP: No components found with this basename: POCBNP,  CBG:  Recent Labs Lab 08/29/13 1922 08/30/13 1653 08/30/13 2209  GLUCAP 191* 187* 172*     Radiological Exams on Admission: Dg Chest 2 View  08/29/2013   CLINICAL DATA:  Preop  EXAM: CHEST  2 VIEW  COMPARISON:  None.  FINDINGS: Lungs are clear. No pleural effusion or pneumothorax.  Cardiomegaly.  Mild degenerative changes of the visualized thoracolumbar spine.  IMPRESSION: No evidence of acute cardiopulmonary disease.   Electronically Signed   By: Lurlean Horns  Rito Ehrlich M.D.   On: 08/29/2013 14:25      Time spent: 60 minutes  Jeralyn Bennett [facility] Pager [phone]  If 7PM-7AM, please contact night-coverage [email or web address] Password [facility] 08/31/2013, 11:12 AM

## 2013-08-31 NOTE — Anesthesia Postprocedure Evaluation (Signed)
Anesthesia Post Note  Patient: Samuel Garner  Procedure(s) Performed: Procedure(s) (LRB): TRANSURETHRAL RESECTION OF THE PROSTATE (TURP) and Clot evacuation (N/A)  Anesthesia type: General  Patient location: PACU  Post pain: Pain level controlled  Post assessment: Post-op Vital signs reviewed  Last Vitals: BP 114/56  Pulse 81  Temp(Src) 37.2 C (Oral)  Resp 20  Ht 5\' 9"  (1.753 m)  Wt 236 lb 15.9 oz (107.5 kg)  BMI 34.98 kg/m2  SpO2 97%  Post vital signs: Reviewed  Level of consciousness: sedated  Complications: No apparent anesthesia complications

## 2013-08-31 NOTE — Progress Notes (Signed)
Urology Inpatient Progress Report  Clot evac/TURP 08/29/13  Intv/Subj: No acute events overnight. Patient is without complaint. CBI off since yesterday  Objective: Vital: Filed Vitals:   08/30/13 0458 08/30/13 1437 08/30/13 2210 08/31/13 0611  BP: 115/55 110/50 119/57 114/56  Pulse: 84 72 80 81  Temp: 98.8 F (37.1 C) 98.3 F (36.8 C) 99.8 F (37.7 C) 99 F (37.2 C)  TempSrc: Oral Oral Oral Oral  Resp: 20 18 20 20   Height:      Weight:      SpO2: 98% 97% 97% 97%   I/Os: I/O last 3 completed shifts: In: 7901.7 [P.O.:1140; I.V.:3161.7; Other:3600] Out: 7750 [Urine:7750]  Past Medical History  Diagnosis Date  . Diabetes mellitus, type 2     with microalbuminuria  . Hypertension   . Arteriosclerotic cardiovascular disease (ASCVD)      2 vessel percutaneous transluminal coronary angioplasty in 3/93  . Hyperlipidemia     low HDL, high triglycerides  . Tobacco abuse, in remission     discontinued in 1993  . Gout   . Chronic kidney disease     creatinine 1.8 in 2002, 1.6 1n 2007 and 1.0 and 2008; deterioration in renal function; Secondary hyperparathyroidism  . Type IV renal tubular acidosis     With hyperkalemia  . Myocardial infarction     1993   Current Facility-Administered Medications  Medication Dose Route Frequency Provider Last Rate Last Dose  . acetaminophen (TYLENOL) tablet 500-1,000 mg  500-1,000 mg Oral Q6H PRN Crist Fat, MD      . allopurinol (ZYLOPRIM) tablet 100 mg  100 mg Oral QHS Crist Fat, MD   100 mg at 08/30/13 2205  . bisacodyl (DULCOLAX) suppository 10 mg  10 mg Rectal Daily PRN Crist Fat, MD      . Melene Muller ON 09/01/2013] calcitRIOL (ROCALTROL) capsule 0.25 mcg  0.25 mcg Oral Q M,W,F Crist Fat, MD      . ciprofloxacin (CIPRO) tablet 250 mg  250 mg Oral BID Crist Fat, MD   250 mg at 08/31/13 1610  . furosemide (LASIX) tablet 40 mg  40 mg Oral q morning - 10a Crist Fat, MD   40 mg at 08/30/13  1149  . menthol-cetylpyridinium (CEPACOL) lozenge 3 mg  1 lozenge Oral PRN Crist Fat, MD      . metoprolol (LOPRESSOR) tablet 100 mg  100 mg Oral BID Crist Fat, MD   100 mg at 08/30/13 2205  . neomycin-bacitracin-polymyxin (NEOSPORIN) ointment 1 application  1 application Topical TID PRN Crist Fat, MD      . niacin tablet 500 mg  500 mg Oral QHS Crist Fat, MD   500 mg at 08/30/13 2205  . ondansetron (ZOFRAN) tablet 4 mg  4 mg Oral Q8H PRN Crist Fat, MD      . opium-belladonna (B&O SUPPRETTES) suppository 1 suppository  1 suppository Rectal Q6H PRN Crist Fat, MD      . oxyCODONE (Oxy IR/ROXICODONE) immediate release tablet 5-10 mg  5-10 mg Oral Q4H PRN Crist Fat, MD   10 mg at 08/30/13 2144  . phenol (CHLORASEPTIC) mouth spray 1 spray  1 spray Mouth/Throat PRN Crist Fat, MD      . pioglitazone (ACTOS) tablet 30 mg  30 mg Oral Q breakfast Crist Fat, MD   30 mg at 08/31/13 [phone]  . sodium chloride irrigation 0.9 % 3,000 mL  3,000 mL Irrigation Continuous  Crist Fat, MD   3,000 mL at 08/30/13 0201  . verapamil (CALAN-SR) CR tablet 120 mg  120 mg Oral QHS Crist Fat, MD   120 mg at 08/30/13 2205  . zolpidem (AMBIEN) tablet 5 mg  5 mg Oral QHS PRN Crist Fat, MD        Physical Exam:  General: Patient is in no apparent distress Lungs: Normal respiratory effort, chest expands symmetrically. GI: The abdomen is soft and nontender without mass. Ext: lower extremities symmetric  Lab Results:  Recent Labs  08/29/13 1250 08/30/13 0510 08/31/13 0929  WBC 10.3 10.3 10.6*  HGB 9.8* 7.7* 7.0*  HCT 30.8* 23.0* 21.3*    Recent Labs  08/29/13 1250 08/30/13 0510 08/31/13 0929  NA 140 139 136  K 5.3* 5.3* 5.5*  CL 104 108 107  CO2 20 18* 19  GLUCOSE 259* 210* 217*  BUN 70* 71* 75*  CREATININE 3.94* 4.30* 4.76*  CALCIUM 10.1 8.8 8.8   No results found for this basename: LABPT, INR,  in the  last 72 hours No results found for this basename: LABURIN,  in the last 72 hours Results for orders placed during the hospital encounter of 08/28/13  URINE CULTURE     Status: None   Collection Time    08/28/13  6:55 PM      Result Value Range Status   Specimen Description URINE, CATHETERIZED   Final   Special Requests NONE   Final   Culture  Setup Time     Final   Value: 08/29/2013 13:46     Performed at [facility] Count     Final   Value: NO GROWTH     Performed at [facility]   Culture     Final   Value: NO GROWTH     Performed at [facility]   Report Status 08/30/2013 FINAL   Final    Studies/Results: none  Assessment: 2 Days Post-Op, urine is clear on slow CBI.  Creatinine elevated, baseline renal insufficiency.  No complaints of flank pain, he is making urine.  Will get renal ultrasound this AM to r/o obstruction from procedure. Consult to hospital medicine for hyperkalemia, worsening renal function, diabetes and worsening anemia.  Plan: Foley to gravity Renal ultrasound Hospital consult Type and screen- consider transfusing.  Berniece Salines W 08/31/2013, 10:11 AM

## 2013-08-31 NOTE — Progress Notes (Signed)
Urine has remained amber/brownish all shift.  No clots or fresh bleeding noted and CBI has remained clamped since MD turned it off Saturday morning.

## 2013-09-01 ENCOUNTER — Observation Stay (HOSPITAL_COMMUNITY): Payer: Medicare Other

## 2013-09-01 ENCOUNTER — Encounter (HOSPITAL_COMMUNITY): Payer: Self-pay | Admitting: Urology

## 2013-09-01 DIAGNOSIS — R310 Gross hematuria: Secondary | ICD-10-CM

## 2013-09-01 DIAGNOSIS — D62 Acute posthemorrhagic anemia: Secondary | ICD-10-CM

## 2013-09-01 DIAGNOSIS — N2589 Other disorders resulting from impaired renal tubular function: Secondary | ICD-10-CM

## 2013-09-01 DIAGNOSIS — T8389XA Other specified complication of genitourinary prosthetic devices, implants and grafts, initial encounter: Secondary | ICD-10-CM

## 2013-09-01 DIAGNOSIS — R319 Hematuria, unspecified: Secondary | ICD-10-CM

## 2013-09-01 DIAGNOSIS — N179 Acute kidney failure, unspecified: Secondary | ICD-10-CM

## 2013-09-01 DIAGNOSIS — N3289 Other specified disorders of bladder: Secondary | ICD-10-CM

## 2013-09-01 DIAGNOSIS — R338 Other retention of urine: Secondary | ICD-10-CM

## 2013-09-01 DIAGNOSIS — E119 Type 2 diabetes mellitus without complications: Secondary | ICD-10-CM

## 2013-09-01 DIAGNOSIS — N189 Chronic kidney disease, unspecified: Secondary | ICD-10-CM

## 2013-09-01 LAB — CBC
HCT: 24 % — ABNORMAL LOW (ref 39.0–52.0)
Hemoglobin: 7.9 g/dL — ABNORMAL LOW (ref 13.0–17.0)
MCH: 29.4 pg (ref 26.0–34.0)
MCHC: 32.9 g/dL (ref 30.0–36.0)
MCV: 89.2 fL (ref 78.0–100.0)

## 2013-09-01 LAB — TYPE AND SCREEN: Unit division: 0

## 2013-09-01 LAB — BASIC METABOLIC PANEL
Chloride: 106 mEq/L (ref 96–112)
GFR calc Af Amer: 14 mL/min — ABNORMAL LOW (ref >90)
GFR calc non Af Amer: 12 mL/min — ABNORMAL LOW (ref >90)
Glucose, Bld: 141 mg/dL — ABNORMAL HIGH (ref 70–99)
Potassium: 4.6 mEq/L (ref 3.5–5.1)
Sodium: 137 mEq/L (ref 135–145)

## 2013-09-01 LAB — GLUCOSE, CAPILLARY
Glucose-Capillary: 139 mg/dL — ABNORMAL HIGH (ref 70–99)
Glucose-Capillary: 178 mg/dL — ABNORMAL HIGH (ref 70–99)

## 2013-09-01 MED ORDER — SODIUM CHLORIDE 0.9 % IV SOLN
INTRAVENOUS | Status: DC
  Administered 2013-09-01: 22:00:00 via INTRAVENOUS

## 2013-09-01 MED ORDER — SODIUM CHLORIDE 0.9 % IV SOLN: INTRAVENOUS | Status: DC

## 2013-09-01 NOTE — Progress Notes (Signed)
Urology Inpatient Progress Report  Clot evac/TURP 08/29/13  Intv/Subj: No acute events overnight. Patient is without complaint. Renal ultrasound consistent with CT scan done several weeks prior - dilated left kidney stable unchanged. Transfused 2 units. More productive coughing this AM.  Objective: Vital: Filed Vitals:   08/31/13 1945 08/31/13 2045 08/31/13 2129 09/01/13 0551  BP: 129/62 136/61 127/61 141/54  Pulse: 73 73 75 78  Temp: 99.2 F (37.3 C) 99.3 F (37.4 C) 98.9 F (37.2 C) 98 F (36.7 C)  TempSrc: Oral Oral Oral Oral  Resp: 18 18 18 18   Height:      Weight:      SpO2: 97% 98% 98% 94%   I/Os: I/O last 3 completed shifts: In: 4600 [P.O.:1800; I.V.:2775; Blood:25] Out: 2000 [Urine:2000]  Past Medical History  Diagnosis Date  . Diabetes mellitus, type 2     with microalbuminuria  . Hypertension   . Arteriosclerotic cardiovascular disease (ASCVD)      2 vessel percutaneous transluminal coronary angioplasty in 3/93  . Hyperlipidemia     low HDL, high triglycerides  . Tobacco abuse, in remission     discontinued in 1993  . Gout   . Chronic kidney disease     creatinine 1.8 in 2002, 1.6 1n 2007 and 1.0 and 2008; deterioration in renal function; Secondary hyperparathyroidism  . Type IV renal tubular acidosis     With hyperkalemia  . Myocardial infarction     1993   Current Facility-Administered Medications  Medication Dose Route Frequency Provider Last Rate Last Dose  . 0.9 %  sodium chloride infusion   Intravenous Continuous Crist Fat, MD 75 mL/hr at 08/31/13 1030    . acetaminophen (TYLENOL) tablet 500-1,000 mg  500-1,000 mg Oral Q6H PRN Crist Fat, MD      . allopurinol (ZYLOPRIM) tablet 100 mg  100 mg Oral QHS Crist Fat, MD   100 mg at 08/31/13 2248  . bisacodyl (DULCOLAX) suppository 10 mg  10 mg Rectal Daily PRN Crist Fat, MD      . calcitRIOL (ROCALTROL) capsule 0.25 mcg  0.25 mcg Oral Q M,W,F Crist Fat,  MD      . furosemide (LASIX) injection 20 mg  20 mg Intravenous Once Jeralyn Bennett, MD      . insulin aspart (novoLOG) injection 0-15 Units  0-15 Units Subcutaneous TID WC Jeralyn Bennett, MD   3 Units at 08/31/13 1643  . insulin aspart (novoLOG) injection 0-5 Units  0-5 Units Subcutaneous QHS Jeralyn Bennett, MD      . menthol-cetylpyridinium (CEPACOL) lozenge 3 mg  1 lozenge Oral PRN Crist Fat, MD      . metoprolol (LOPRESSOR) tablet 100 mg  100 mg Oral BID Crist Fat, MD   100 mg at 08/31/13 2248  . neomycin-bacitracin-polymyxin (NEOSPORIN) ointment 1 application  1 application Topical TID PRN Crist Fat, MD      . niacin tablet 500 mg  500 mg Oral QHS Crist Fat, MD   500 mg at 08/31/13 2249  . ondansetron (ZOFRAN) tablet 4 mg  4 mg Oral Q8H PRN Crist Fat, MD      . opium-belladonna (B&O SUPPRETTES) suppository 1 suppository  1 suppository Rectal Q6H PRN Crist Fat, MD      . oxyCODONE (Oxy IR/ROXICODONE) immediate release tablet 5-10 mg  5-10 mg Oral Q4H PRN Crist Fat, MD   10 mg at 08/31/13 2248  . phenol (CHLORASEPTIC) mouth  spray 1 spray  1 spray Mouth/Throat PRN Crist Fat, MD      . pioglitazone (ACTOS) tablet 30 mg  30 mg Oral Q breakfast Crist Fat, MD   30 mg at 08/31/13 [phone]  . verapamil (CALAN-SR) CR tablet 120 mg  120 mg Oral QHS Crist Fat, MD   120 mg at 08/31/13 2249  . zolpidem (AMBIEN) tablet 5 mg  5 mg Oral QHS PRN Crist Fat, MD        Physical Exam:  General: Patient is in no apparent distress Lungs: Normal respiratory effort, chest expands symmetrically. GI: The abdomen is soft and nontender without mass. Ext: lower extremities symmetric  Lab Results:  Recent Labs  08/30/13 0510 08/31/13 0929 09/01/13 0513  WBC 10.3 10.6* 8.2  HGB 7.7* 7.0* 7.9*  HCT 23.0* 21.3* 24.0*    Recent Labs  08/30/13 0510 08/31/13 0929 09/01/13 0513  NA 139 136 137  K 5.3* 5.5* 4.6   CL 108 107 106  CO2 18* 19 17*  GLUCOSE 210* 217* 141*  BUN 71* 75* 70*  CREATININE 4.30* 4.76* 4.37*  CALCIUM 8.8 8.8 8.7   No results found for this basename: LABPT, INR,  in the last 72 hours No results found for this basename: LABURIN,  in the last 72 hours Results for orders placed during the hospital encounter of 08/28/13  URINE CULTURE     Status: None   Collection Time    08/28/13  6:55 PM      Result Value Range Status   Specimen Description URINE, CATHETERIZED   Final   Special Requests NONE   Final   Culture  Setup Time     Final   Value: 08/29/2013 13:46     Performed at [facility] Count     Final   Value: NO GROWTH     Performed at [facility]   Culture     Final   Value: NO GROWTH     Performed at [facility]   Report Status 08/30/2013 FINAL   Final    Studies/Results: none  Assessment: 3 Days Post-Op, urine is clear off CBI x 48 hours.  Creatinine improved with transfusion.  Cough now, ? Pulmonary edema vs. Baseline smokers cough.  Hgb better, smaller bump then expected.   Will leave medical management to Hospitalist.  Voiding trial this AM.  Plan: Foley out - bladder scans q4. D/c when medically cleared by hospitalist service - consider CXR. Will re-eval later this AM.  Berniece Salines W 09/01/2013, 7:16 AM

## 2013-09-01 NOTE — Progress Notes (Signed)
[facility] PROGRESS NOTE  Samuel Garner:[id number] DOB: 02-19-1933 DOA: 08/29/2013 PCP: Catalina Pizza, MD  Assessment/Plan: 1. Acute blood loss anemia. I secondary to gross hematuria. His hemoglobin trended down to 7.0 on 08/31/2013 which he was typed and cross and transfuse 2 units of packed red blood cells. Repeat labs today show a hemoglobin of 7.9. He continues to have some blood-tinged urine, though has not been grossly bloody. We'll continue monitoring today, repeat a CBC in a.m. 2. Acute on chronic renal failure. Patient with a history of stage III/IV chronic kidney disease, having a creatinine of 2.39 on 01/20/2013. He had an initial creatinine of 3.73 on 08/28/2013 the trended up to 4.76 on 08/31/2013. After administering blood and some IV fluids his creatinine improved to 4.37 on this mornings lab work. I will continue gentle IV fluid hydration with normal saline, repeat lab work in a.m. Renal ultrasound was performed on 08/31/2013  which showed mild left hydronephrosis, mild right pelviectasis without frank hydronephrosis. 3. Cough. Patient reported some cough overnight. He did have a few bibasal crackles on exam today. He appears comfortable however does not require supplemental oxygen. He does not appear to be in acute respiratory distress with oxygen saturations in the mid 90s on room air. Will check a BNP and a chest x-ray. 4. Urinary retention. Patient undergoing cystoscopy and continuous bladder irrigation for 48 hours. Neurology has discontinued his Foley catheter this morning. Voiding trial today. 5. Type 2 diabetes mellitus. Placed on sliding scale insulin, continue Accu-Cheks q. a.c. each bedtime 6. Hypertension. Continue metoprolol 100 mg by mouth twice a day and verapamil 120 mg by mouth daily. Blood pressures are stable.  Code Status: Full Code Family Communication: I spoke with patient's family members at bedside.  Disposition Plan: Provide gentle IV fluids, voiding  trial, repeat am labs   Consultants:  Urology   HPI/Subjective: Patient is a pleasant 79 year old gentleman with a past medical history of type 2 diabetes mellitus, hypertension, dyslipidemia, type IV renal tubular acidosis, who was initially admitted to the urology service for urinary retention gross hematuria. Imaging studies showing mass in the bladder concerning for neoplasm, suspected be secondary to prostate cancer. He was transfused with 2 units of packed blood cells yesterday for acute blood loss anemia in setting of gross hematuria. Patient was also administered normal saline given the development of acute on chronic renal failure. He states having some cough overnight however this morning he states feeling well, ambulated down the hallway with his walker.  Objective: Filed Vitals:   09/01/13 0551  BP: 141/54  Pulse: 78  Temp: 98 F (36.7 C)  Resp: 18    Intake/Output Summary (Last 24 hours) at 09/01/13 1054 Last data filed at 09/01/13 1034  Gross per 24 hour  Intake 3322.5 ml  Output   1700 ml  Net 1622.5 ml   Filed Weights   08/29/13 1306 08/29/13 2258  Weight: 106.595 kg (235 lb) 107.5 kg (236 lb 15.9 oz)    Exam:   General:  Patient is awake alert, ambulating down the hallway, off of supplemental oxygen.  Cardiovascular: Regular rate rhythm normal S1-S2  Respiratory: Patient having a few bibasal crackles, normal respiratory effort  Abdomen: Soft nontender nondistended  Musculoskeletal: No edema  Data Reviewed: Basic Metabolic Panel:  Recent Labs Lab 08/28/13 1940 08/29/13 1250 08/30/13 0510 08/31/13 0929 09/01/13 0513  NA 140 140 139 136 137  K 5.7* 5.3* 5.3* 5.5* 4.6  CL 105 104 108 107 106  CO2 22 20 18* 19 17*  GLUCOSE 156* 259* 210* 217* 141*  BUN 66* 70* 71* 75* 70*  CREATININE 3.73* 3.94* 4.30* 4.76* 4.37*  CALCIUM 9.9 10.1 8.8 8.8 8.7   Liver Function Tests:  Recent Labs Lab 08/28/13 1940 08/29/13 1250  AST 13 13  ALT 9 8   ALKPHOS 47 46  BILITOT 0.3 0.4  PROT 7.1 7.1  ALBUMIN 3.5 3.4*   No results found for this basename: LIPASE, AMYLASE,  in the last 168 hours No results found for this basename: AMMONIA,  in the last 168 hours CBC:  Recent Labs Lab 08/28/13 1940 08/29/13 1250 08/30/13 0510 08/31/13 0929 09/01/13 0513  WBC 6.7 10.3 10.3 10.6* 8.2  HGB 10.0* 9.8* 7.7* 7.0* 7.9*  HCT 30.9* 30.8* 23.0* 21.3* 24.0*  MCV 92.0 91.7 89.8 91.0 89.2  PLT 205 227 179 154 180   Cardiac Enzymes: No results found for this basename: CKTOTAL, CKMB, CKMBINDEX, TROPONINI,  in the last 168 hours BNP (last 3 results) No results found for this basename: PROBNP,  in the last 8760 hours CBG:  Recent Labs Lab 08/30/13 2209 08/31/13 1214 08/31/13 1626 08/31/13 2127 09/01/13 0730  GLUCAP 172* 198* 151* 151* 160*    Recent Results (from the past 240 hour(s))  URINE CULTURE     Status: None   Collection Time    08/28/13  6:55 PM      Result Value Range Status   Specimen Description URINE, CATHETERIZED   Final   Special Requests NONE   Final   Culture  Setup Time     Final   Value: 08/29/2013 13:46     Performed at [facility] Count     Final   Value: NO GROWTH     Performed at [facility]   Culture     Final   Value: NO GROWTH     Performed at [facility]   Report Status 08/30/2013 FINAL   Final     Studies: US Renal  08/31/2013   CLINICAL DATA:  Elevated creatinine, hematuria, bladder mass  EXAM: RENAL/URINARY TRACT ULTRASOUND COMPLETE  COMPARISON:  CT abdomen pelvis dated 08/15/2013  FINDINGS: Right Kidney:  Length: 12.5 cm. Cortical thinning. Mild pelviectasis without frank hydronephrosis (image 7).  Left Kidney:  Length: 13.7 cm. Cortical thinning. 4.5 x 5.3 4.4 cm upper pole renal cyst. 2.2 x 2.6 cm lower pole renal cyst. Mild hydronephrosis.  Bladder:  Decompressed by indwelling Foley catheter.  IMPRESSION: Mild left hydronephrosis.  Mild right pelviectasis  without frank hydronephrosis.  Bladder is decompressed by indwelling Foley catheter.   Electronically Signed   By: Charline Bills M.D.   On: 08/31/2013 15:09    Scheduled Meds: . allopurinol  100 mg Oral QHS  . calcitRIOL  0.25 mcg Oral Q M,W,F  . furosemide  20 mg Intravenous Once  . insulin aspart  0-15 Units Subcutaneous TID WC  . insulin aspart  0-5 Units Subcutaneous QHS  . metoprolol  100 mg Oral BID  . niacin  500 mg Oral QHS  . pioglitazone  30 mg Oral Q breakfast  . verapamil  120 mg Oral QHS   Continuous Infusions: . sodium chloride      Principal Problem:   Gross hematuria Active Problems:   Acute urinary retention   Bladder mass   Hematuria    Time spent: 35 minutes    Jeralyn Bennett  [facility] Pager [phone]. If 7PM-7AM,  please contact night-coverage at [email or web address], password [facility] 09/01/2013, 10:54 AM  LOS: 3 days

## 2013-09-01 NOTE — Progress Notes (Signed)
UR completed.  Patient changed to inpatient r/t requiring IVF @ 75cc/hr and continued Hgb monitoring.

## 2013-09-02 LAB — BASIC METABOLIC PANEL
CO2: 17 mEq/L — ABNORMAL LOW (ref 19–32)
Chloride: 106 mEq/L (ref 96–112)
Creatinine, Ser: 3.45 mg/dL — ABNORMAL HIGH (ref 0.50–1.35)
Glucose, Bld: 159 mg/dL — ABNORMAL HIGH (ref 70–99)
Potassium: 4.6 mEq/L (ref 3.5–5.1)
Sodium: 136 mEq/L (ref 135–145)

## 2013-09-02 LAB — CBC
HCT: 24.2 % — ABNORMAL LOW (ref 39.0–52.0)
MCH: 29.1 pg (ref 26.0–34.0)
MCHC: 33.1 g/dL (ref 30.0–36.0)
MCV: 88 fL (ref 78.0–100.0)
RDW: 14.8 % (ref 11.5–15.5)

## 2013-09-02 LAB — GLUCOSE, CAPILLARY: Glucose-Capillary: 167 mg/dL — ABNORMAL HIGH (ref 70–99)

## 2013-09-02 NOTE — Progress Notes (Signed)
Patient voided only 50 ml after 6 hours from the last time he voided, went back to bed not complaining of discomfort or bladder distention. Bladder scan performed post void and revealed >740 ml of urine. Foley catheter inserted as ordered, procedure tolerated by patient.

## 2013-09-02 NOTE — Discharge Summary (Addendum)
Physician Discharge Summary  Samuel Garner:[id number] DOB: [birth year] DOA: 08/29/2013  PCP: Samuel Pizza, MD  Admit date: 08/29/2013 Discharge date: 09/02/2013  Time spent: 35 minutes  Recommendations for Outpatient Follow-up:  1. Please follow up on a BMP and CBC on his hospital follow up visit. Patient having acute blood loss anemia and acute on chronic renal failure during this hospitalization.   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   Discharge Condition: Stable/Improved  Diet recommendation: Heart Healthy/Diabetic Diet  Filed Weights   08/29/13 1306 08/29/13 2258  Weight: 106.595 kg (235 lb) 107.5 kg (236 lb 15.9 oz)    History of present illness:  [name] is a 79 yo WM who I was asked to see in consultation by Dr. Dwana Garner for gross hematuria and a bladder mass seen on CT on 12/5. He was to come to the office but had to go to the ER for an obstructed foley. He had the onset of hematuria 4-5 weeks ago. It was heavy for 2-3 days and then it cleared. He saw Dr. Margo Garner who got a CT that showed a 6.7cm mass at the base of the bladder. On my review of the films, I think he probably has a prostate cancer. He went into clot retention yesterday and had a foley placed but it blocked so he had a 49fr placed which worked until today but has now obstructed and he is having pain.  Hospital Course:  Patient is a pleasant 79 year old gentleman gentleman with a past medical history type 2 diabetes mellitus, hypertension, dyslipidemia, type IV renal tubular acidosis, stage III/IV chronic kidney disease who presented to the ER on 08/28/2013 with a history of gross hematuria becoming worse over the 2 weeks prior to admission. CT scan of abdomen and pelvis performed 08/15/2013 showed a 6.0 x 4.7 cm irregular mass posteriorly  and inferiorly in the urinary bladder concerning for neoplasm. He was also found to have an enlarged right external iliac lymph nodes concerning for metastatic disease. Patient was discharged from the emergency department to follow up with urology as an outpatient. He was seen by Samuel Garner on 08/29/2013 for obstructive Foley catheter that was replaced. On 08/29/2013 he underwentr cystoscopy with clot back lesion as well as TURP, procedure performed by Dr. Lollie Garner. Postprocedure he was placed on continuous bladder irrigation. During this hospitalization, his hemoglobin trended down to 7.0 from 10.0 on 08/28/2013, for which she was typed and cross and transfuse with 2 units of packed red blood cells. He was also done to have worsening kidney function, with his creatinine going from 3.73 on 08/28/2013 4.76 on 08/31/2013. He was started on IV fluids, with his creatinine trended down to 3.45 on 09/02/2013 after receiving NS. I suspect acute on chronic renal failure secondary to anemia and volume depletion. His hemoglobin remained stable at 8.0 by 09/02/2013. He was seen by Samuel Garner who recommended discharging him on a Foley catheter as he failed a voiding trial. Patient was discharged in stable condition on 09/02/2013 followup with urology in the next week   Consultations:  Urology  Discharge Exam: Filed Vitals:   09/02/13 0701  BP: 132/57  Pulse: 73  Temp: 98.8 F (37.1 C)  Resp: 16    General: Patient is no acute distress, he states feeling well and anxious to go home today. Cardiovascular:  Regular rate rhythm normal S1-S2 Respiratory: Lungs are clear to auscultation bilaterally Abdomen: Soft nontender nondistended Genitourinary: Foley catheter in place  Discharge Instructions  Discharge Orders   Future Orders Complete By Expires   Call MD for:  difficulty breathing, headache or visual disturbances  As directed    Call MD for:  extreme fatigue  As directed    Call MD for:  persistant  nausea and vomiting  As directed    Call MD for:  severe uncontrolled pain  As directed    Call MD for:  temperature >100.4  As directed    Care order/instruction  As directed    Scheduling Instructions:     Please provide patient with leg bag and foley catheter instructions   Diet - low sodium heart healthy  As directed    Discharge patient  As directed    Increase activity slowly  As directed        Medication List    STOP taking these medications       ciprofloxacin 250 MG tablet  Commonly known as:  CIPRO      TAKE these medications       acetaminophen 500 MG tablet  Commonly known as:  TYLENOL  Take 500-1,500 mg by mouth every 6 (six) hours as needed.     allopurinol 100 MG tablet  Commonly known as:  ZYLOPRIM  Take 100 mg by mouth at bedtime.     calcitRIOL 0.25 MCG capsule  Commonly known as:  ROCALTROL  Take 0.25 mcg by mouth every Monday, Wednesday, and Friday.     fenofibrate micronized 43 MG capsule  Commonly known as:  ANTARA  Take 43 mg by mouth every morning.     Fish Oil 1200 MG Caps  Take 1 capsule by mouth 2 (two) times daily.     furosemide 40 MG tablet  Commonly known as:  LASIX  Take 40 mg by mouth every morning.     metoprolol 100 MG tablet  Commonly known as:  LOPRESSOR  Take 100 mg by mouth 2 (two) times daily.     niacin 500 MG tablet  Take 500 mg by mouth at bedtime.     oxybutynin 5 MG 24 hr tablet  Commonly known as:  DITROPAN XL  Take 1 tablet (5 mg total) by mouth at bedtime.     pioglitazone 30 MG tablet  Commonly known as:  ACTOS  Take 30 mg by mouth every morning.     pravastatin 40 MG tablet  Commonly known as:  PRAVACHOL  Take 40 mg by mouth at bedtime.     PRESERVISION/LUTEIN Caps  Take 1 capsule by mouth at bedtime.     verapamil 120 MG CR tablet  Commonly known as:  CALAN-SR  Take 1 tablet (120 mg total) by mouth at bedtime.     Vitamin D3 1000 UNITS Caps  Take 1,000 Units by mouth every morning.        Allergies  Allergen Reactions  . Lisinopril     REACTION: acute renal failure: Patient is not familiar with this medication or an allergy       Follow-up Information   Follow up with Samuel Fat, MD On 09/03/2013. (We will contact patient.)    Specialty:  Urology   Contact information:   [address] [place] [address] [place] [address] [phone]       Follow up with Samuel Pizza, MD In 2 weeks.   Specialty:  Internal Medicine   Contact information:    [address]  [place] [address] [phone]        The results of significant diagnostics from this hospitalization (including imaging, microbiology, ancillary and laboratory) are listed below for reference.    Significant Diagnostic Studies: Ct Abdomen Pelvis Wo Contrast  08/15/2013   CLINICAL DATA:  Abdominal pain, gross hematuria.  EXAM: CT ABDOMEN AND PELVIS WITHOUT CONTRAST  TECHNIQUE: Multidetector CT imaging of the abdomen and pelvis was performed following the standard protocol without intravenous contrast.  COMPARISON:  None.  FINDINGS: Visualized lung bases appear normal. These unenhanced images demonstrate no focal abnormality of the liver, spleen or pancreas. No gallstones are noted. The adrenal glands appear normal. Splenic artery calcifications are noted. 6.6 cm cyst is seen arising from upper pole of left kidney. No hydronephrosis or renal obstruction is seen on the right. However, nonobstructive calculus is noted in midpole collecting system of left kidney. 1.9 cm exophytic cyst arises from lower pole of left kidney. Moderate left hydroureteronephrosis is noted secondary to 6 mm calculus in distal left ureter. The appendix appears normal. There is no evidence of bowel obstruction. At least 2 large calculi are noted within the urinary bladder irregular mass measuring 6.7 x 4.7 cm is noted posteriorly and inferiorly in the urinary bladder concerning for neoplasm. Garner containing left inguinal hernia is noted.  No abnormal fluid collection is noted. Several large right external iliac lymph nodes are noted with the largest measuring 16 x 10 mm. Given the presence of probable bladder mass, these are concerning for metastatic disease. Atherosclerotic calcifications of abdominal aorta and iliac arteries are noted without aneurysm formation.  IMPRESSION: Nonobstructive left renal calculus is noted. Moderate left hydroureteronephrosis is noted secondary to 6 mm distal left ureteral calculus.  Bladder calculi are noted. 6.7 x 4.7 cm irregular mass is noted posteriorly and inferiorly in the urinary bladder concerning for neoplasm or malignancy; cystoscopy is recommended for further evaluation.  Enlarged right external iliac lymph nodes are which are concerning for metastatic disease given the presence of possible bladder mass.   Electronically Signed   By: Roque Lias M.D.   On: 08/15/2013 12:11   Dg Chest 2 View  09/01/2013   CLINICAL DATA:  Two-day history of cough, history of previous smoking  EXAM: CHEST  2 VIEW  COMPARISON:  Chest x-ray of August 29, 2013.  FINDINGS: The lungs are adequately inflated. The interstitial markings are mildly increased though stable. The cardiopericardial silhouette is mildly enlarged. The pulmonary vascularity is not engorged. The mediastinum is normal in width. There is no pleural effusion. The observed portions of the bony thorax appear normal.  IMPRESSION: There is no evidence of pneumonia nor CHF. Chronically increased interstitial markings likely reflect scarring though certainly one cannot exclude superimposed acute bronchitis with subsegmental atelectasis. Followup films are recommended if the patient's symptoms do not improve with anticipated therapy.   Electronically Signed   By: David  [place]   On: 09/01/2013 13:27   Dg Chest 2 View  08/29/2013   CLINICAL DATA:  Preop  EXAM: CHEST  2 VIEW  COMPARISON:  None.  FINDINGS: Lungs are clear. No pleural effusion or pneumothorax.   Cardiomegaly.  Mild degenerative changes of the visualized thoracolumbar spine.  IMPRESSION: No evidence of acute cardiopulmonary disease.   Electronically Signed   By: Charline Bills M.D.   On: 08/29/2013 14:25   US Renal  08/31/2013   CLINICAL DATA:  Elevated creatinine,  hematuria, bladder mass  EXAM: RENAL/URINARY TRACT ULTRASOUND COMPLETE  COMPARISON:  CT abdomen pelvis dated 08/15/2013  FINDINGS: Right Kidney:  Length: 12.5 cm. Cortical thinning. Mild pelviectasis without frank hydronephrosis (image 7).  Left Kidney:  Length: 13.7 cm. Cortical thinning. 4.5 x 5.3 4.4 cm upper pole renal cyst. 2.2 x 2.6 cm lower pole renal cyst. Mild hydronephrosis.  Bladder:  Decompressed by indwelling Foley catheter.  IMPRESSION: Mild left hydronephrosis.  Mild right pelviectasis without frank hydronephrosis.  Bladder is decompressed by indwelling Foley catheter.   Electronically Signed   By: Charline Bills M.D.   On: 08/31/2013 15:09    Microbiology: Recent Results (from the past 240 hour(s))  URINE CULTURE     Status: None   Collection Time    08/28/13  6:55 PM      Result Value Range Status   Specimen Description URINE, CATHETERIZED   Final   Special Requests NONE   Final   Culture  Setup Time     Final   Value: 08/29/2013 13:46     Performed at [facility] Count     Final   Value: NO GROWTH     Performed at [facility]   Culture     Final   Value: NO GROWTH     Performed at [facility]   Report Status 08/30/2013 FINAL   Final     Labs: Basic Metabolic Panel:  Recent Labs Lab 08/29/13 1250 08/30/13 0510 08/31/13 0929 09/01/13 0513 09/02/13 0430  NA 140 139 136 137 136  K 5.3* 5.3* 5.5* 4.6 4.6  CL 104 108 107 106 106  CO2 20 18* 19 17* 17*  GLUCOSE 259* 210* 217* 141* 159*  BUN 70* 71* 75* 70* 66*  CREATININE 3.94* 4.30* 4.76* 4.37* 3.45*  CALCIUM 10.1 8.8 8.8 8.7 8.7   Liver Function Tests:  Recent Labs Lab 08/28/13 1940  08/29/13 1250  AST 13 13  ALT 9 8  ALKPHOS 47 46  BILITOT 0.3 0.4  PROT 7.1 7.1  ALBUMIN 3.5 3.4*   No results found for this basename: LIPASE, AMYLASE,  in the last 168 hours No results found for this basename: AMMONIA,  in the last 168 hours CBC:  Recent Labs Lab 08/29/13 1250 08/30/13 0510 08/31/13 0929 09/01/13 0513 09/02/13 0430  WBC 10.3 10.3 10.6* 8.2 7.6  HGB 9.8* 7.7* 7.0* 7.9* 8.0*  HCT 30.8* 23.0* 21.3* 24.0* 24.2*  MCV 91.7 89.8 91.0 89.2 88.0  PLT 227 179 154 180 200   Cardiac Enzymes: No results found for this basename: CKTOTAL, CKMB, CKMBINDEX, TROPONINI,  in the last 168 hours BNP: BNP (last 3 results)  Recent Labs  09/01/13 0513  PROBNP 2536.0*   CBG:  Recent Labs Lab 09/01/13 0730 09/01/13 1215 09/01/13 1659 09/01/13 2100 09/02/13 0736  GLUCAP 160* 170* 139* 178* 167*       Signed:  Masaye Gatchalian  [facility] 09/02/2013, 10:06 AM    ADDENDUM:   Pathology showing 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

## 2013-09-02 NOTE — Progress Notes (Signed)
Urology Inpatient Progress Report  Clot evac/TURP 08/29/13  Intv/Subj: Foley catheter replaced last night - Bladder scan ~750cc, patient unable to void No acute events overnight No complaints   Objective: Vital: Filed Vitals:   09/01/13 0551 09/01/13 1504 09/01/13 2103 09/02/13 0701  BP: 141/54 111/39 118/94 132/57  Pulse: 78 66 80 73  Temp: 98 F (36.7 C) 98.7 F (37.1 C) 98.3 F (36.8 C) 98.8 F (37.1 C)  TempSrc: Oral Oral Oral Oral  Resp: 18 20 16 16   Height:      Weight:      SpO2: 94% 98% 99% 94%   I/Os: I/O last 3 completed shifts: In: 3762.5 [P.O.:1620; I.V.:2142.5] Out: 2200 [Urine:2200]  Past Medical History  Diagnosis Date  . Diabetes mellitus, type 2     with microalbuminuria  . Hypertension   . Arteriosclerotic cardiovascular disease (ASCVD)      2 vessel percutaneous transluminal coronary angioplasty in 3/93  . Hyperlipidemia     low HDL, high triglycerides  . Tobacco abuse, in remission     discontinued in 1993  . Gout   . Chronic kidney disease     creatinine 1.8 in 2002, 1.6 1n 2007 and 1.0 and 2008; deterioration in renal function; Secondary hyperparathyroidism  . Type IV renal tubular acidosis     With hyperkalemia  . Myocardial infarction     1993   Current Facility-Administered Medications  Medication Dose Route Frequency Provider Last Rate Last Dose  . 0.9 %  sodium chloride infusion   Intravenous Continuous Jeralyn Bennett, MD 75 mL/hr at 09/01/13 2224    . acetaminophen (TYLENOL) tablet 500-1,000 mg  500-1,000 mg Oral Q6H PRN Crist Fat, MD      . allopurinol (ZYLOPRIM) tablet 100 mg  100 mg Oral QHS Crist Fat, MD   100 mg at 09/01/13 2211  . bisacodyl (DULCOLAX) suppository 10 mg  10 mg Rectal Daily PRN Crist Fat, MD      . calcitRIOL (ROCALTROL) capsule 0.25 mcg  0.25 mcg Oral Q M,W,F Crist Fat, MD   0.25 mcg at 09/01/13 1033  . furosemide (LASIX) injection 20 mg  20 mg Intravenous Once Jeralyn Bennett, MD      . insulin aspart (novoLOG) injection 0-15 Units  0-15 Units Subcutaneous TID WC Jeralyn Bennett, MD   2 Units at 09/01/13 1816  . insulin aspart (novoLOG) injection 0-5 Units  0-5 Units Subcutaneous QHS Jeralyn Bennett, MD      . menthol-cetylpyridinium (CEPACOL) lozenge 3 mg  1 lozenge Oral PRN Crist Fat, MD      . metoprolol (LOPRESSOR) tablet 100 mg  100 mg Oral BID Crist Fat, MD   100 mg at 09/01/13 2211  . neomycin-bacitracin-polymyxin (NEOSPORIN) ointment 1 application  1 application Topical TID PRN Crist Fat, MD      . niacin tablet 500 mg  500 mg Oral QHS Crist Fat, MD   500 mg at 09/01/13 2210  . ondansetron (ZOFRAN) tablet 4 mg  4 mg Oral Q8H PRN Crist Fat, MD      . opium-belladonna (B&O SUPPRETTES) suppository 1 suppository  1 suppository Rectal Q6H PRN Crist Fat, MD      . oxyCODONE (Oxy IR/ROXICODONE) immediate release tablet 5-10 mg  5-10 mg Oral Q4H PRN Crist Fat, MD   10 mg at 08/31/13 2248  . phenol (CHLORASEPTIC) mouth spray 1 spray  1 spray Mouth/Throat PRN Crist Fat,  MD      . pioglitazone (ACTOS) tablet 30 mg  30 mg Oral Q breakfast Crist Fat, MD   30 mg at 09/01/13 0820  . verapamil (CALAN-SR) CR tablet 120 mg  120 mg Oral QHS Crist Fat, MD   120 mg at 09/01/13 2210  . zolpidem (AMBIEN) tablet 5 mg  5 mg Oral QHS PRN Crist Fat, MD        Physical Exam:  General: Patient is in no apparent distress Lungs: Normal respiratory effort, chest expands symmetrically. GU: foley draining clear yellow urine - 33F catheter placed GI: The abdomen is soft and nontender without mass. Ext: lower extremities symmetric  Lab Results:  Recent Labs  08/31/13 0929 09/01/13 0513 09/02/13 0430  WBC 10.6* 8.2 7.6  HGB 7.0* 7.9* 8.0*  HCT 21.3* 24.0* 24.2*    Recent Labs  08/31/13 0929 09/01/13 0513 09/02/13 0430  NA 136 137 136  K 5.5* 4.6 4.6  CL 107 106 106  CO2  19 17* 17*  GLUCOSE 217* 141* 159*  BUN 75* 70* 66*  CREATININE 4.76* 4.37* 3.45*  CALCIUM 8.8 8.7 8.7   No results found for this basename: LABPT, INR,  in the last 72 hours No results found for this basename: LABURIN,  in the last 72 hours Results for orders placed during the hospital encounter of 08/28/13  URINE CULTURE     Status: None   Collection Time    08/28/13  6:55 PM      Result Value Range Status   Specimen Description URINE, CATHETERIZED   Final   Special Requests NONE   Final   Culture  Setup Time     Final   Value: 08/29/2013 13:46     Performed at [facility] Count     Final   Value: NO GROWTH     Performed at [facility]   Culture     Final   Value: NO GROWTH     Performed at [facility]   Report Status 08/30/2013 FINAL   Final    Studies/Results: none  Assessment: 4 Days Post-Op, urinary retention, catheter replaced.  Creatinine improved, trending back to baseline.  Hgb stable.  Plan: Will schedule patient for voiding trial at [facility] with either Dr. Annabell Howells or Dr. Retta Diones in 5-7 days. Leg bag and foley teaching. Ok for discharge from my perspective, final decision per Hospitalist, Dr. Vanessa Barbara.  Berniece Salines W 09/02/2013, 7:21 AM

## 2013-09-02 NOTE — Progress Notes (Signed)
Pt left hospital at this time with his spouse/children at his side. Pt alert, oriented, and without c/o. Discharge instructions given/explained with pt/family verbalizing understanding.  Supplies given for Foley catheter; and education of Foley catheter care given.

## 2013-09-12 ENCOUNTER — Other Ambulatory Visit (HOSPITAL_COMMUNITY): Payer: Self-pay | Admitting: Urology

## 2013-09-12 DIAGNOSIS — C61 Malignant neoplasm of prostate: Secondary | ICD-10-CM

## 2013-09-15 ENCOUNTER — Telehealth: Payer: Self-pay | Admitting: *Deleted

## 2013-09-15 ENCOUNTER — Encounter: Payer: Self-pay | Admitting: Radiation Oncology

## 2013-09-15 DIAGNOSIS — C61 Malignant neoplasm of prostate: Secondary | ICD-10-CM | POA: Insufficient documentation

## 2013-09-15 NOTE — Progress Notes (Signed)
GU Location of Tumor / Histology: large mass protruding from posterior bladder neck likely prostatic in origin  Patient was transferred from [facility] emergency room to [facility] on 08/29/2013 for emergency cystopscopy, clot evacuation, and TURP for gross hematuria  Biopsies of bladder neck mass revealed: high grade poorly differentiated carcinoma with neuroendocrine features  Weight changes, if any: None noted  Bowel/Bladder complaints, if any: Denies urinary retention, dysuria or pain.   Nausea/Vomiting, if any: None noted  Pain issues, if any:  Denies  SAFETY ISSUES:  Prior radiation? NO  Pacemaker/ICD? NO  Possible current pregnancy? N/A  Is the patient on methotrexate? N/A  Current Complaints / other details:  80 year old male. Retired. Married. Two daughters. Enlarged right external iliac lymph nodes are concerning for metastatic disease. Whole body bone scan scheduled for 09/26/2013.

## 2013-09-15 NOTE — Telephone Encounter (Signed)
Called pt to introduce myself as Prostate Clinic coordinator and to confirm his attendance at the 09/19/13 clinic.  Provided my phone # should he have any questions after receiving Information Packet and prior to clinic.  Will contact pt, again, prior to clinic.  Gayleen Orem, RN, BSN, [facility] Prostate Oncology Navigator [phone]

## 2013-09-17 ENCOUNTER — Inpatient Hospital Stay (HOSPITAL_COMMUNITY)
Admission: EM | Admit: 2013-09-17 | Discharge: 2013-09-26 | DRG: 982 | Disposition: A | Discharge status: Home Health | Payer: Medicare Other | Attending: Internal Medicine | Admitting: Internal Medicine

## 2013-09-17 ENCOUNTER — Inpatient Hospital Stay: Admission: AD | Admit: 2013-09-17 | Payer: Self-pay | Source: Ambulatory Visit | Admitting: Urology

## 2013-09-17 ENCOUNTER — Inpatient Hospital Stay (HOSPITAL_COMMUNITY): Payer: Medicare Other

## 2013-09-17 ENCOUNTER — Encounter (HOSPITAL_COMMUNITY): Payer: Self-pay | Admitting: Emergency Medicine

## 2013-09-17 DIAGNOSIS — N179 Acute kidney failure, unspecified: Secondary | ICD-10-CM | POA: Diagnosis present

## 2013-09-17 DIAGNOSIS — Z79899 Other long term (current) drug therapy: Secondary | ICD-10-CM

## 2013-09-17 DIAGNOSIS — E872 Acidosis, unspecified: Secondary | ICD-10-CM | POA: Diagnosis present

## 2013-09-17 DIAGNOSIS — E875 Hyperkalemia: Secondary | ICD-10-CM | POA: Diagnosis present

## 2013-09-17 DIAGNOSIS — D62 Acute posthemorrhagic anemia: Secondary | ICD-10-CM | POA: Diagnosis not present

## 2013-09-17 DIAGNOSIS — I129 Hypertensive chronic kidney disease with stage 1 through stage 4 chronic kidney disease, or unspecified chronic kidney disease: Secondary | ICD-10-CM | POA: Diagnosis present

## 2013-09-17 DIAGNOSIS — I739 Peripheral vascular disease, unspecified: Secondary | ICD-10-CM | POA: Diagnosis present

## 2013-09-17 DIAGNOSIS — E785 Hyperlipidemia, unspecified: Secondary | ICD-10-CM | POA: Diagnosis present

## 2013-09-17 DIAGNOSIS — E46 Unspecified protein-calorie malnutrition: Secondary | ICD-10-CM | POA: Diagnosis present

## 2013-09-17 DIAGNOSIS — N189 Chronic kidney disease, unspecified: Secondary | ICD-10-CM

## 2013-09-17 DIAGNOSIS — N139 Obstructive and reflux uropathy, unspecified: Secondary | ICD-10-CM | POA: Diagnosis present

## 2013-09-17 DIAGNOSIS — E119 Type 2 diabetes mellitus without complications: Secondary | ICD-10-CM

## 2013-09-17 DIAGNOSIS — D630 Anemia in neoplastic disease: Secondary | ICD-10-CM | POA: Diagnosis present

## 2013-09-17 DIAGNOSIS — R319 Hematuria, unspecified: Secondary | ICD-10-CM

## 2013-09-17 DIAGNOSIS — Z87891 Personal history of nicotine dependence: Secondary | ICD-10-CM

## 2013-09-17 DIAGNOSIS — D649 Anemia, unspecified: Secondary | ICD-10-CM

## 2013-09-17 DIAGNOSIS — D509 Iron deficiency anemia, unspecified: Secondary | ICD-10-CM | POA: Diagnosis present

## 2013-09-17 DIAGNOSIS — R310 Gross hematuria: Secondary | ICD-10-CM

## 2013-09-17 DIAGNOSIS — R338 Other retention of urine: Secondary | ICD-10-CM

## 2013-09-17 DIAGNOSIS — Z7982 Long term (current) use of aspirin: Secondary | ICD-10-CM

## 2013-09-17 DIAGNOSIS — N2581 Secondary hyperparathyroidism of renal origin: Secondary | ICD-10-CM | POA: Diagnosis present

## 2013-09-17 DIAGNOSIS — N3289 Other specified disorders of bladder: Secondary | ICD-10-CM

## 2013-09-17 DIAGNOSIS — I252 Old myocardial infarction: Secondary | ICD-10-CM

## 2013-09-17 DIAGNOSIS — I2510 Atherosclerotic heart disease of native coronary artery without angina pectoris: Secondary | ICD-10-CM | POA: Diagnosis present

## 2013-09-17 DIAGNOSIS — M109 Gout, unspecified: Secondary | ICD-10-CM

## 2013-09-17 DIAGNOSIS — R339 Retention of urine, unspecified: Secondary | ICD-10-CM | POA: Diagnosis present

## 2013-09-17 DIAGNOSIS — R609 Edema, unspecified: Secondary | ICD-10-CM | POA: Diagnosis present

## 2013-09-17 DIAGNOSIS — Z833 Family history of diabetes mellitus: Secondary | ICD-10-CM

## 2013-09-17 DIAGNOSIS — C679 Malignant neoplasm of bladder, unspecified: Secondary | ICD-10-CM | POA: Diagnosis present

## 2013-09-17 DIAGNOSIS — E871 Hypo-osmolality and hyponatremia: Secondary | ICD-10-CM | POA: Diagnosis present

## 2013-09-17 DIAGNOSIS — Z9861 Coronary angioplasty status: Secondary | ICD-10-CM

## 2013-09-17 DIAGNOSIS — Z888 Allergy status to other drugs, medicaments and biological substances status: Secondary | ICD-10-CM

## 2013-09-17 DIAGNOSIS — C61 Malignant neoplasm of prostate: Secondary | ICD-10-CM

## 2013-09-17 DIAGNOSIS — I10 Essential (primary) hypertension: Secondary | ICD-10-CM

## 2013-09-17 DIAGNOSIS — N1330 Unspecified hydronephrosis: Secondary | ICD-10-CM | POA: Diagnosis present

## 2013-09-17 DIAGNOSIS — N135 Crossing vessel and stricture of ureter without hydronephrosis: Secondary | ICD-10-CM | POA: Diagnosis present

## 2013-09-17 DIAGNOSIS — N184 Chronic kidney disease, stage 4 (severe): Secondary | ICD-10-CM | POA: Diagnosis present

## 2013-09-17 DIAGNOSIS — Z9079 Acquired absence of other genital organ(s): Secondary | ICD-10-CM

## 2013-09-17 DIAGNOSIS — M129 Arthropathy, unspecified: Secondary | ICD-10-CM | POA: Diagnosis present

## 2013-09-17 LAB — COMPREHENSIVE METABOLIC PANEL
ALK PHOS: 58 U/L (ref 39–117)
ALT: 7 U/L (ref 0–53)
AST: 10 U/L (ref 0–37)
Albumin: 2.7 g/dL — ABNORMAL LOW (ref 3.5–5.2)
BILIRUBIN TOTAL: 0.3 mg/dL (ref 0.3–1.2)
BUN: 88 mg/dL — ABNORMAL HIGH (ref 6–23)
CALCIUM: 9.2 mg/dL (ref 8.4–10.5)
CHLORIDE: 96 meq/L (ref 96–112)
CO2: 12 meq/L — AB (ref 19–32)
Creatinine, Ser: 11.39 mg/dL — ABNORMAL HIGH (ref 0.50–1.35)
GFR calc non Af Amer: 4 mL/min — ABNORMAL LOW (ref >90)
GFR, EST AFRICAN AMERICAN: 4 mL/min — AB (ref >90)
Glucose, Bld: 188 mg/dL — ABNORMAL HIGH (ref 70–99)
Potassium: 6.4 mEq/L — ABNORMAL HIGH (ref 3.7–5.3)
SODIUM: 134 meq/L — AB (ref 137–147)
Total Protein: 6.8 g/dL (ref 6.0–8.3)

## 2013-09-17 LAB — CBC
HCT: 23.6 % — ABNORMAL LOW (ref 39.0–52.0)
HEMOGLOBIN: 7.7 g/dL — AB (ref 13.0–17.0)
MCH: 28.4 pg (ref 26.0–34.0)
MCHC: 32.6 g/dL (ref 30.0–36.0)
MCV: 87.1 fL (ref 78.0–100.0)
Platelets: 327 10*3/uL (ref 150–400)
RBC: 2.71 MIL/uL — ABNORMAL LOW (ref 4.22–5.81)
RDW: 14.5 % (ref 11.5–15.5)
WBC: 14.3 10*3/uL — ABNORMAL HIGH (ref 4.0–10.5)

## 2013-09-17 LAB — RENAL FUNCTION PANEL
Albumin: 2.5 g/dL — ABNORMAL LOW (ref 3.5–5.2)
BUN: 88 mg/dL — ABNORMAL HIGH (ref 6–23)
CHLORIDE: 97 meq/L (ref 96–112)
CO2: 15 meq/L — AB (ref 19–32)
Calcium: 9 mg/dL (ref 8.4–10.5)
Creatinine, Ser: 11.73 mg/dL — ABNORMAL HIGH (ref 0.50–1.35)
GFR calc Af Amer: 4 mL/min — ABNORMAL LOW (ref >90)
GFR calc non Af Amer: 4 mL/min — ABNORMAL LOW (ref >90)
GLUCOSE: 248 mg/dL — AB (ref 70–99)
POTASSIUM: 5.8 meq/L — AB (ref 3.7–5.3)
Phosphorus: 7.5 mg/dL — ABNORMAL HIGH (ref 2.3–4.6)
Sodium: 138 mEq/L (ref 137–147)

## 2013-09-17 LAB — URINE MICROSCOPIC-ADD ON

## 2013-09-17 LAB — URINALYSIS, ROUTINE W REFLEX MICROSCOPIC
Glucose, UA: NEGATIVE mg/dL
Ketones, ur: 40 mg/dL — AB
Nitrite: POSITIVE — AB
Protein, ur: >300 mg/dL — AB
SPECIFIC GRAVITY, URINE: 1.01 (ref 1.005–1.030)
Urobilinogen, UA: 0.2 mg/dL (ref 0.0–1.0)
pH: 7 (ref 5.0–8.0)

## 2013-09-17 LAB — GLUCOSE, CAPILLARY: GLUCOSE-CAPILLARY: 195 mg/dL — AB (ref 70–99)

## 2013-09-17 MED ORDER — ACETAMINOPHEN 325 MG PO TABS: 650.0000 mg | ORAL_TABLET | Freq: Four times a day (QID) | ORAL | Status: DC | PRN

## 2013-09-17 MED ORDER — SODIUM CHLORIDE 0.9 % IV SOLN: INTRAVENOUS | Status: DC

## 2013-09-17 MED ORDER — SODIUM CHLORIDE 0.9 % IV SOLN
INTRAVENOUS | Status: DC
  Administered 2013-09-20: 22:00:00 via INTRAVENOUS

## 2013-09-17 MED ORDER — FENOFIBRATE 54 MG PO TABS
54.0000 mg | ORAL_TABLET | Freq: Every day | ORAL | Status: DC
  Administered 2013-09-17 – 2013-09-26 (×10): 54 mg via ORAL
  Filled 2013-09-17 (×10): qty 1

## 2013-09-17 MED ORDER — DEXTROSE 50 % IV SOLN
1.0000 | Freq: Once | INTRAVENOUS | Status: AC
  Administered 2013-09-17: 50 mL via INTRAVENOUS
  Filled 2013-09-17: qty 50

## 2013-09-17 MED ORDER — SODIUM BICARBONATE 8.4 % IV SOLN
50.0000 meq | Freq: Once | INTRAVENOUS | Status: AC
  Administered 2013-09-17: 50 meq via INTRAVENOUS
  Filled 2013-09-17: qty 50

## 2013-09-17 MED ORDER — SIMVASTATIN 40 MG PO TABS
40.0000 mg | ORAL_TABLET | Freq: Every day | ORAL | Status: DC
  Administered 2013-09-17 – 2013-09-26 (×10): 40 mg via ORAL
  Filled 2013-09-17 (×10): qty 1

## 2013-09-17 MED ORDER — NIACIN 500 MG PO TABS
500.0000 mg | ORAL_TABLET | Freq: Every day | ORAL | Status: DC
  Administered 2013-09-17 – 2013-09-25 (×9): 500 mg via ORAL
  Filled 2013-09-17 (×10): qty 1

## 2013-09-17 MED ORDER — ONDANSETRON HCL 4 MG/2ML IJ SOLN
4.0000 mg | Freq: Four times a day (QID) | INTRAMUSCULAR | Status: DC | PRN
  Administered 2013-09-17: 4 mg via INTRAVENOUS
  Filled 2013-09-17: qty 2

## 2013-09-17 MED ORDER — SODIUM POLYSTYRENE SULFONATE 15 GM/60ML PO SUSP
30.0000 g | Freq: Once | ORAL | Status: AC
  Administered 2013-09-17: 30 g via ORAL
  Filled 2013-09-17 (×2): qty 120

## 2013-09-17 MED ORDER — VITAMIN D 1000 UNITS PO TABS
1000.0000 [IU] | ORAL_TABLET | Freq: Every morning | ORAL | Status: DC
  Administered 2013-09-18 – 2013-09-26 (×9): 1000 [IU] via ORAL
  Filled 2013-09-17 (×9): qty 1

## 2013-09-17 MED ORDER — METOPROLOL TARTRATE 100 MG PO TABS
100.0000 mg | ORAL_TABLET | Freq: Two times a day (BID) | ORAL | Status: DC
  Administered 2013-09-17 – 2013-09-26 (×18): 100 mg via ORAL
  Filled 2013-09-17 (×19): qty 1

## 2013-09-17 MED ORDER — INSULIN ASPART 100 UNIT/ML ~~LOC~~ SOLN
8.0000 [IU] | Freq: Once | SUBCUTANEOUS | Status: AC
  Administered 2013-09-17: 8 [IU] via INTRAVENOUS
  Filled 2013-09-17: qty 1

## 2013-09-17 MED ORDER — SODIUM CHLORIDE 0.9 % IV SOLN
1.0000 g | Freq: Once | INTRAVENOUS | Status: AC
  Administered 2013-09-17: 1 g via INTRAVENOUS
  Filled 2013-09-17: qty 10

## 2013-09-17 MED ORDER — STERILE WATER FOR INJECTION IV SOLN: 3.0000 | INTRAVENOUS | Status: DC

## 2013-09-17 MED ORDER — MORPHINE SULFATE 2 MG/ML IJ SOLN
2.0000 mg | INTRAMUSCULAR | Status: DC | PRN
  Administered 2013-09-17: 2 mg via INTRAVENOUS
  Filled 2013-09-17: qty 1

## 2013-09-17 MED ORDER — INSULIN ASPART 100 UNIT/ML ~~LOC~~ SOLN
0.0000 [IU] | Freq: Three times a day (TID) | SUBCUTANEOUS | Status: DC
  Administered 2013-09-18 – 2013-09-19 (×2): 3 [IU] via SUBCUTANEOUS
  Administered 2013-09-19: 17:00:00 5 [IU] via SUBCUTANEOUS
  Administered 2013-09-19 – 2013-09-22 (×7): 3 [IU] via SUBCUTANEOUS
  Administered 2013-09-22: 1 [IU] via SUBCUTANEOUS
  Administered 2013-09-22 – 2013-09-24 (×7): 3 [IU] via SUBCUTANEOUS
  Administered 2013-09-25 (×2): 2 [IU] via SUBCUTANEOUS
  Administered 2013-09-25 – 2013-09-26 (×4): 3 [IU] via SUBCUTANEOUS

## 2013-09-17 MED ORDER — INSULIN ASPART 100 UNIT/ML ~~LOC~~ SOLN
0.0000 [IU] | Freq: Every day | SUBCUTANEOUS | Status: DC
  Administered 2013-09-24: 2 [IU] via SUBCUTANEOUS

## 2013-09-17 MED ORDER — STERILE WATER FOR INJECTION IV SOLN
INTRAVENOUS | Status: DC
  Administered 2013-09-18 – 2013-09-20 (×5): via INTRAVENOUS
  Filled 2013-09-17 (×10): qty 850

## 2013-09-17 MED ORDER — ONDANSETRON HCL 4 MG PO TABS: 4.0000 mg | ORAL_TABLET | Freq: Four times a day (QID) | ORAL | Status: DC | PRN

## 2013-09-17 MED ORDER — CALCITRIOL 0.25 MCG PO CAPS
0.2500 ug | ORAL_CAPSULE | ORAL | Status: DC
  Administered 2013-09-17 – 2013-09-26 (×5): 0.25 ug via ORAL
  Filled 2013-09-17 (×5): qty 1

## 2013-09-17 MED ORDER — ACETAMINOPHEN 650 MG RE SUPP
650.0000 mg | Freq: Four times a day (QID) | RECTAL | Status: DC | PRN
Start: 2013-09-17 — End: 2013-09-26

## 2013-09-17 NOTE — Consult Note (Signed)
Reason For Visit gross hematuria w/ clots   History of Present Illness    Samuel Garner was referred to Dr. Louis Meckel by PCP Dr. Wende Neighbors for 4-5 wk hx of gross hematuria with Aug 15, 2013 CT finding of large mass on posterior floor of bladder suspicious for large prostate median lobe/prostate ca. He was scheduled for consultation office visit w/ Dr. Louis Meckel on Sep 12, 2013 but developed gross hematuria w/ clots on Dec 18 and Foley catheter was inserted at [facility] but pt returned on Dec 19 w/ clot retention and was transferred to [place] where he underwent OR cysto clot evacuation, TURP of median lobe and excision/biopsy of bladder neck mass in addition to removal of a bladder calculus found intraoperatively. The patient hospital course was complicated by acute on chronic renal failure, and urinary retention. His creatinine rose to 4.24 before beginning to come back down. In addition, the patient was unable to void following a voiding trial while in the hospital, a catheter was replaced and he was followed up here for a voiding trial 1 week later. He passed his voiding trial at that time and no foley catheter needed..    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 Due to comorbidities including chronic renal insufficiency, diabetes, peripheral vascular disease/coronary artery disease, which will likely limit the amount of chemotherapy that he is eligible to receive, he was referred to multidiscipline prostate cancer clinic where he can meet with both oncology and radiation oncology.     Interval hx:  Samuel Garner returns today, accompanied by his wife and daughter, c/o bladder pain and only dribbling of urine since yesterday afternoon when he first noted the beginning of gross hematuria associated w/ passing several clots. He was unable to eat or sleep since yesterday evening due to  constant urge to void every 15- 30 min.  States that he was voiding well w/o hematuria since last Friday's office visit. The patient denies any symptoms of fevers, chills, nausea/vomiting. Denies hematuria. Admits to feeling incomplete bladder emptying.  He had blood work done at [facility] office yesterday. Received faxed lab result today with creatinine 9.83, BUN 78, and K 6.4.  Bladder scan w/ PVR = 468 ml.     Patient currently is asymptomatic. BP 159/85, temp 97.4 F   Past Medical History Problems  1. History of arthritis (V13.4) 2. History of gout (V12.29) 3. History of hyperlipidemia (V12.29) 4. History of hypertension (V12.59) 5. History of myocardial infarction (412)  Surgical History Problems  1. History of Bladder Surgery 2. History of Heart Surgery 3. History of Hernia Repair 4. History of Knee Surgery 5. History of Transurethral Resection Of Prostate (TURP)  Current Meds 1. Actos 30 MG Oral Tablet;  Therapy: (Recorded:31Dec2014) to Recorded 2. Allopurinol 100 MG Oral Tablet;  Therapy: (Recorded:31Dec2014) to Recorded 3. Aspirin Low Dose 81 MG Oral Tablet;  Therapy: (Recorded:31Dec2014) to Recorded 4. Calan 120 MG Oral Tablet;  Therapy: (Recorded:31Dec2014) to Recorded 5. Fenofibrate TABS;  Therapy: (Recorded:31Dec2014) to Recorded 6. Fish Oil 1000 MG Oral Capsule Delayed Release;  Therapy: (Recorded:31Dec2014) to Recorded 7. Furosemide 40 MG Oral Tablet;  Therapy: (Recorded:31Dec2014) to Recorded 8. Metoprolol Tartrate 100 MG Oral Tablet;  Therapy: (Recorded:31Dec2014) to Recorded 9. Niacin 500 MG Oral Tablet;  Therapy: (Recorded:31Dec2014) to Recorded 10. Pravachol 40 MG Oral Tablet;   Therapy: (Recorded:31Dec2014) to Recorded 11. PreserVision AREDS CAPS;   Therapy: (Recorded:31Dec2014) to Recorded 12.  Rocaltrol 0.25 MCG Oral Capsule;   Therapy: (Recorded:31Dec2014) to Recorded 13. Tylenol TABS;   Therapy: (Recorded:31Dec2014) to Recorded 14. Vitamin D3 1000  UNIT Oral Capsule;   Therapy: (Recorded:31Dec2014) to Recorded  Allergies Medication  1. No Known Drug Allergies  Family History Problems  1. Family history of Death of family member : Mother, Father   Mother at age 21; old ageFather at age 73; heart attack 2. Family history of diabetes mellitus (V18.0) : Mother, Sister  Social History Problems  1. Denied: History of Alcohol use 2. Denied: History of Caffeine use 3. Former smoker [place])   smoked 1 1/2 ppd for 25 years and quit 21 years ago (1993) 4. Married 5. Number of children   2 daughters 6. Retired  Review of Systems Genitourinary system(s) were reviewed and pertinent findings if present are noted.  Genitourinary: urinary frequency, feelings of urinary urgency, urinary stream starts and stops, incomplete emptying of bladder, hematuria and suprapubic pain, but no dysuria and no post-void dribbling.  Gastrointestinal: no flank pain and no abdominal pain.    Vitals Vital Signs [Data Includes: Last 1 Day]  Recorded: [id number] 09:19AM  Blood Pressure: 159 / 85 Temperature: 97.4 F  Physical Exam Constitutional: Well nourished and well developed . In acute distress. Uncomfortable w/ SP pain.  ENT:. The ears and nose are normal in appearance.  Pulmonary: No respiratory distress.  Cardiovascular: Heart rate and rhythm are normal.  Abdomen: The abdomen is soft and nontender. Suprapubic tenderness is present. No CVA tenderness.  Genitourinary: The penis is uncircumcised. Hidden penis covered by swollen foreskin.    Results/Data  The following images/tracing/specimen were independently visualized:  PVR = 468.  PVR: Ultrasound PVR 468 ml.    Procedure 1)  Using sterile technique in sterile field, Lidocaine gel from urojet was infused into urethra. 22 Fr 2-way foley catheter was inserted and drained 269ml of cranberry urine color but unable to irrigate due to resistance caused by clot. Therefore, 22 fr catheter was  removed and 24 Fr [place] catheter was inserted and able to manually irrigate large amount of clots with a total of 5 L of sterile water. Dr. Alinda Money, on call urologist, was able irrigate and remove more clots w/ another 1 L of water. Since there is still residual clots and unresolved hematuria after manual irrigation, 24 Fr was removed and 24 Fr coude 3-way Foley catheter was inserted and continuous bladder irrigation (CBI) was initiated in the clinic.    2) Empiric Rocephin 1 GM IM was given today during the irrigation process     Assessment Assessed  1. Gross hematuria (599.71)1  2. Urinary retention (788.20)1   Clot retention from gross hematuria, acute on chronic renal failure, hyperkalemia and a host of comordities.  Patient's bladder was cleared of clots and was started on CBI, patient will need to be admitted for CBI due to clots and to telemetry for cardiac monitoring due to K 6.4.  However, since there is no room available on Telemetry at [facility] and also due to pt's comorbidities, patient was taken to the [facility] ER in the wheelchair with the CBI running wide open.   Will defer to hospitalist for management of electrolytes and other comordities.  Plan Malignant neoplasm of prostate  1. Administered: CefTRIAXone Sodium 1 GM Injection Solution Reconstituted (Rocephin)

## 2013-09-17 NOTE — ED Notes (Signed)
MD at bedside. Nephrology here to evaluate pt

## 2013-09-17 NOTE — Progress Notes (Signed)
Patient ID: Samuel Garner, male   DOB: [birth year], 80 y.o.   MRN: [id number]  I was called to patient's bedside after patient was attempted to be started on CBI.  It was noted that irrigation would not flow and catheter could not be irrigated.  I deflated the catheter balloon after attempts to irrigate the catheter were unsuccessful.  I repositioned the catheter into the bladder with an immediate return of over 500cc of bloody urine. I inflated the balloon with about 40 cc of sterile water and began irrigation without difficulty.  CBI was titrated until urine was light pink/clear.

## 2013-09-17 NOTE — ED Notes (Addendum)
Report given to [place]. Aware of need for D/C of NS and Pending Isotonic Bicarb solution.  US performed in ED- will be delay in transfer until [place] complete.  Family and pt aware. Sheffield Slider RN ED aware of pt current status. Charge Terri RN aware of pt current status

## 2013-09-17 NOTE — H&P (Signed)
[facility] History and Physical  Samuel Garner ZJQ:[id number] DOB: 1933/06/09 DOA: 09/17/2013  Referring physician: Emergency department PCP: Delphina Cahill, MD  Specialists:   Chief Complaint: Hematuria  HPI: Samuel Garner is a 80 y.o. male  With a hx of CKD, DM, HTN, and prostate cancer who was recently discharged on 09/02/13 for hematuria. The patient presented for follow up with complaints of recurrent hematuria and urinary incontinence. The patient was noted to have just under 500cc of urine via bladder scan and a foley was placed. Bloodwork demonstrated a K of 6.4 with a Cr of over 11 (baseline of around 3). The patient was given glucose with insulin as well as kayexalate. Urology and nephrology were consulted. The hospitalist was consulted for admission.  Review of Systems:  Per above, the remainder of the 10pt ros reviewed and are neg  Past Medical History  Diagnosis Date  . Diabetes mellitus, type 2     with microalbuminuria  . Hypertension   . Arteriosclerotic cardiovascular disease (ASCVD)      2 vessel percutaneous transluminal coronary angioplasty in 3/93  . Hyperlipidemia     low HDL, high triglycerides  . Tobacco abuse, in remission     discontinued in 1993  . Gout   . Chronic kidney disease     creatinine 1.8 in 2002, 1.6 1n 2007 and 1.0 and 2008; deterioration in renal function; Secondary hyperparathyroidism  . Type IV renal tubular acidosis     With hyperkalemia  . Myocardial infarction     1993  . Small cell carcinoma of prostate    Past Surgical History  Procedure Laterality Date  . Hernia repair    . Coronary angioplasty  1993  . Knee arthroscopy    . Transurethral resection of prostate N/A 08/29/2013    Procedure: TRANSURETHRAL RESECTION OF THE PROSTATE (TURP) and Clot evacuation;  Surgeon: Ardis Hughs, MD;  Location: [facility] ORS;  Service: Urology;  Laterality: N/A;   Social History:  reports that he quit smoking about 22 years ago. He has  never used smokeless tobacco. He reports that he does not drink alcohol or use illicit drugs.  where does patient live--home, ALF, SNF? and with whom if at home?  Can patient participate in ADLs?  Allergies  Allergen Reactions  . Lisinopril     REACTION: acute renal failure: Patient is not familiar with this medication or an allergy    Family History  Problem Relation Age of Onset  . Diabetes Mother   . Heart attack Father     (be sure to complete)  Prior to Admission medications   Medication Sig Start Date End Date Taking? Authorizing Provider  acetaminophen (TYLENOL) 500 MG tablet Take 500-1,000 mg by mouth every 6 (six) hours as needed for moderate pain or headache.    Yes Historical Provider, MD  allopurinol (ZYLOPRIM) 100 MG tablet Take 100 mg by mouth at bedtime.    Yes Historical Provider, MD  calcitRIOL (ROCALTROL) 0.25 MCG capsule Take 0.25 mcg by mouth every Monday, Wednesday, and Friday.    Yes Historical Provider, MD  Cholecalciferol (VITAMIN D3) 1000 UNITS CAPS Take 1,000 Units by mouth every morning.    Yes Historical Provider, MD  fenofibrate micronized (ANTARA) 43 MG capsule Take 43 mg by mouth every morning.  01/08/13  Yes Historical Provider, MD  furosemide (LASIX) 40 MG tablet Take 40 mg by mouth every morning.   Yes Historical Provider, MD  metoprolol (LOPRESSOR) 100 MG tablet Take  100 mg by mouth 2 (two) times daily.   Yes Historical Provider, MD  Multiple Vitamins-Minerals (PRESERVISION/LUTEIN) CAPS Take 1 capsule by mouth at bedtime.    Yes Historical Provider, MD  niacin 500 MG tablet Take 500 mg by mouth at bedtime.   Yes Historical Provider, MD  pioglitazone (ACTOS) 30 MG tablet Take 30 mg by mouth every morning.    Yes Historical Provider, MD  pravastatin (PRAVACHOL) 40 MG tablet Take 40 mg by mouth at bedtime.   Yes Historical Provider, MD  verapamil (CALAN-SR) 120 MG CR tablet Take 1 tablet (120 mg total) by mouth at bedtime. 12/19/12  Yes Yehuda Savannah, MD   aspirin EC 81 MG tablet Take 81 mg by mouth daily.    Historical Provider, MD   Physical Exam: Filed Vitals:   09/17/13 1332  BP: 158/98  Pulse: 87  Temp: 98.6 F (37 C)  TempSrc: Oral  Resp: 20  SpO2: 98%     General:  Awake, in nad  Eyes: PERRL B  ENT: membranes moist, dentition fair  Neck: trachea midline, neck supple  Cardiovascular: regular, s1, s2  Respiratory: normal resp effort, no wheezing  Abdomen: soft, generally tender  Skin: normal skin turgor, no abnormal skin lesions seen  Musculoskeletal: perfused, no clubbing  Psychiatric: mood/affect normal // no auditory/visual hallucinations  Neurologic: cn2-12 grossly intact, strength/sensation intact  Labs on Admission:  Basic Metabolic Panel:  Recent Labs Lab 09/17/13 1500  NA 134*  K 6.4*  CL 96  CO2 12*  GLUCOSE 188*  BUN 88*  CREATININE 11.39*  CALCIUM 9.2   Liver Function Tests:  Recent Labs Lab 09/17/13 1500  AST 10  ALT 7  ALKPHOS 58  BILITOT 0.3  PROT 6.8  ALBUMIN 2.7*   No results found for this basename: LIPASE, AMYLASE,  in the last 168 hours No results found for this basename: AMMONIA,  in the last 168 hours CBC:  Recent Labs Lab 09/17/13 1500  WBC 14.3*  HGB 7.7*  HCT 23.6*  MCV 87.1  PLT 327   Cardiac Enzymes: No results found for this basename: CKTOTAL, CKMB, CKMBINDEX, TROPONINI,  in the last 168 hours  BNP (last 3 results)  Recent Labs  09/01/13 0513  PROBNP 2536.0*   CBG: No results found for this basename: GLUCAP,  in the last 168 hours  Radiological Exams on Admission: No results found.  EKG: Independently reviewed. NSR  Assessment/Plan Principal Problem:   Obstructive uropathy Active Problems:   Diabetes mellitus, type 2   Hypertension   Chronic kidney disease   Gross hematuria   ARF (acute renal failure)   1. Acute renal failure 1. Likely post-obstructive 2. Foley inserted 3. Cont with IVF as tolerated 4. Nephrology  consulted 5. Hold nephrotoxic drugs (including ACEI from home med list) 6. Follow renal fx closely 2. Hyperkalemia 1. S/p insulin and kayexalate in the ED 2. Will repeat lytes and treat accordingly 3. DM 1. Cont with SSI as tolerated 4. HTN 1. Stable 2. Cont regimen 5. Hx gout 1. Hold allopurinol given ARF 6. Hematuria 1. S/p foley cath 2. Urology following 7. Anemia 1. Follow CBC 2. Likely related to hematuria 8. DVT prophylaxis 1. SCD's for prophylaxis   Code Status: Full (must indicate code status--if unknown or must be presumed, indicate so) Family Communication: Pt in room (indicate person spoken with, if applicable, with phone number if by telephone) Disposition Plan: Pending (indicate anticipated LOS)  Time spent: 83min  Wateen Varon  [facility] Pager [phone]  If 7PM-7AM, please contact night-coverage [email or web address] Password [email or web address] 09/17/2013, 5:14 PM

## 2013-09-17 NOTE — ED Provider Notes (Addendum)
CSN: [id number]     Arrival date & time 09/17/13  1317 History   First MD Initiated Contact with Patient 09/17/13 1352     Chief Complaint  Patient presents with  . Abnormal Lab    Potassium 6.4   (Consider location/radiation/quality/duration/timing/severity/associated sxs/prior Treatment) The history is provided by the patient and a relative.  pt with hx CRF, prostate ca s/p recent surgery for same approximately 3 weeks ago, presents from urology clinic w hematuria, gen weakness. Also noted on labs from yesterday w AKI w cr 9, up from baseline cr 3, and elevated k. Pt states had done well post op from prostate surgery, had foley removed approximately 1 week ago, w onset hematuria, difficulty voiding last pm. No other abn bleeding or bruising. No antiocoag use. Compliant w normal meds. No nsaid use. No fever or chills. No abd or flank pain.     Past Medical History  Diagnosis Date  . Diabetes mellitus, type 2     with microalbuminuria  . Hypertension   . Arteriosclerotic cardiovascular disease (ASCVD)      2 vessel percutaneous transluminal coronary angioplasty in 3/93  . Hyperlipidemia     low HDL, high triglycerides  . Tobacco abuse, in remission     discontinued in 1993  . Gout   . Chronic kidney disease     creatinine 1.8 in 2002, 1.6 1n 2007 and 1.0 and 2008; deterioration in renal function; Secondary hyperparathyroidism  . Type IV renal tubular acidosis     With hyperkalemia  . Myocardial infarction     1993  . Small cell carcinoma of prostate    Past Surgical History  Procedure Laterality Date  . Hernia repair    . Coronary angioplasty  1993  . Knee arthroscopy    . Transurethral resection of prostate N/A 08/29/2013    Procedure: TRANSURETHRAL RESECTION OF THE PROSTATE (TURP) and Clot evacuation;  Surgeon: Ardis Hughs, MD;  Location: [facility];  Service: Urology;  Laterality: N/A;   Family History  Problem Relation Age of Onset  . Diabetes Mother   . Heart  attack Father    History  Substance Use Topics  . Smoking status: Former Smoker -- 1.00 packs/day for 40 years    Quit date: 09/12/1991  . Smokeless tobacco: Not on file  . Alcohol Use: No    Review of Systems  Constitutional: Negative for fever and chills.  HENT: Negative for sore throat.   Eyes: Negative for redness.  Respiratory: Negative for shortness of breath.   Cardiovascular: Negative for chest pain.  Gastrointestinal: Negative for vomiting, abdominal pain and diarrhea.  Genitourinary: Positive for hematuria.  Musculoskeletal: Negative for back pain and neck pain.  Skin: Negative for rash.  Neurological: Negative for headaches.  Hematological: Does not bruise/bleed easily.  Psychiatric/Behavioral: Negative for confusion.    Allergies  Lisinopril  Home Medications   Current Outpatient Rx  Name  Route  Sig  Dispense  Refill  . acetaminophen (TYLENOL) 500 MG tablet   Oral   Take 500-1,000 mg by mouth every 6 (six) hours as needed for moderate pain or headache.          . allopurinol (ZYLOPRIM) 100 MG tablet   Oral   Take 100 mg by mouth at bedtime.          . calcitRIOL (ROCALTROL) 0.25 MCG capsule   Oral   Take 0.25 mcg by mouth every Monday, Wednesday, and Friday.          Marland Kitchen  Cholecalciferol (VITAMIN D3) 1000 UNITS CAPS   Oral   Take 1,000 Units by mouth every morning.          . fenofibrate micronized (ANTARA) 43 MG capsule   Oral   Take 43 mg by mouth every morning.          . furosemide (LASIX) 40 MG tablet   Oral   Take 40 mg by mouth every morning.         . metoprolol (LOPRESSOR) 100 MG tablet   Oral   Take 100 mg by mouth 2 (two) times daily.         . Multiple Vitamins-Minerals (PRESERVISION/LUTEIN) CAPS   Oral   Take 1 capsule by mouth at bedtime.          . niacin 500 MG tablet   Oral   Take 500 mg by mouth at bedtime.         . pioglitazone (ACTOS) 30 MG tablet   Oral   Take 30 mg by mouth every morning.           . pravastatin (PRAVACHOL) 40 MG tablet   Oral   Take 40 mg by mouth at bedtime.         . verapamil (CALAN-SR) 120 MG CR tablet   Oral   Take 1 tablet (120 mg total) by mouth at bedtime.   30 tablet   11   . aspirin EC 81 MG tablet   Oral   Take 81 mg by mouth daily.          BP 158/98  Pulse 87  Temp(Src) 98.6 F (37 C) (Oral)  Resp 20  SpO2 98% Physical Exam  Nursing note and vitals reviewed. Constitutional: He is oriented to person, place, and time. He appears well-developed and well-nourished. No distress.  HENT:  Mouth/Throat: Oropharynx is clear and moist.  Eyes: Conjunctivae are normal.  Neck: Neck supple. No tracheal deviation present.  Cardiovascular: Normal rate, regular rhythm, normal heart sounds and intact distal pulses.   Pulmonary/Chest: Effort normal and breath sounds normal. No accessory muscle usage. No respiratory distress.  Abdominal: Soft. Bowel sounds are normal. He exhibits no distension and no mass. There is no tenderness. There is no rebound and no guarding.  obese  Genitourinary:  Normal ext exam. Foley. Bloody urine draining in cath.   Musculoskeletal: Normal range of motion.  Neurological: He is alert and oriented to person, place, and time.  Skin: Skin is warm and dry. He is not diaphoretic.  Psychiatric: He has a normal mood and affect.    ED Course  Procedures (including critical care time)   Results for orders placed during the hospital encounter of 09/17/13  CBC      Result Value Range   WBC 14.3 (*) 4.0 - 10.5 K/uL   RBC 2.71 (*) 4.22 - 5.81 MIL/uL   Hemoglobin 7.7 (*) 13.0 - 17.0 g/dL   HCT 23.6 (*) 39.0 - 52.0 %   MCV 87.1  78.0 - 100.0 fL   MCH 28.4  26.0 - 34.0 pg   MCHC 32.6  30.0 - 36.0 g/dL   RDW 14.5  11.5 - 15.5 %   Platelets 327  150 - 400 K/uL  COMPREHENSIVE METABOLIC PANEL      Result Value Range   Sodium 134 (*) 137 - 147 mEq/L   Potassium 6.4 (*) 3.7 - 5.3 mEq/L   Chloride 96  96 - 112 mEq/L   CO2 12  (*)  19 - 32 mEq/L   Glucose, Bld 188 (*) 70 - 99 mg/dL   BUN 88 (*) 6 - 23 mg/dL   Creatinine, Ser 11.39 (*) 0.50 - 1.35 mg/dL   Calcium 9.2  8.4 - 10.5 mg/dL   Total Protein 6.8  6.0 - 8.3 g/dL   Albumin 2.7 (*) 3.5 - 5.2 g/dL   AST 10  0 - 37 U/L   ALT 7  0 - 53 U/L   Alkaline Phosphatase 58  39 - 117 U/L   Total Bilirubin 0.3  0.3 - 1.2 mg/dL   GFR calc non Af Amer 4 (*) >90 mL/min   GFR calc Af Amer 4 (*) >90 mL/min  URINALYSIS, ROUTINE W REFLEX MICROSCOPIC      Result Value Range   Color, Urine RED (*) YELLOW   APPearance CLOUDY (*) CLEAR   Specific Gravity, Urine 1.010  1.005 - 1.030   pH 7.0  5.0 - 8.0   Glucose, UA NEGATIVE  NEGATIVE mg/dL   Hgb urine dipstick LARGE (*) NEGATIVE   Bilirubin Urine LARGE (*) NEGATIVE   Ketones, ur 40 (*) NEGATIVE mg/dL   Protein, ur >300 (*) NEGATIVE mg/dL   Urobilinogen, UA 0.2  0.0 - 1.0 mg/dL   Nitrite POSITIVE (*) NEGATIVE   Leukocytes, UA MODERATE (*) NEGATIVE  URINE MICROSCOPIC-ADD ON      Result Value Range   Squamous Epithelial / LPF RARE  RARE   WBC, UA 0-2  <3 WBC/hpf   RBC / HPF TOO NUMEROUS TO COUNT  <3 RBC/hpf   Bacteria, UA RARE  RARE   Urine-Other URINALYSIS PERFORMED ON SUPERNATANT    TYPE AND SCREEN      Result Value Range   ABO/RH(D) O POS     Antibody Screen PENDING     Sample Expiration 09/20/2013     Dg Chest 2 View  09/01/2013   CLINICAL DATA:  Two-day history of cough, history of previous smoking  EXAM: CHEST  2 VIEW  COMPARISON:  Chest x-ray of August 29, 2013.  FINDINGS: The lungs are adequately inflated. The interstitial markings are mildly increased though stable. The cardiopericardial silhouette is mildly enlarged. The pulmonary vascularity is not engorged. The mediastinum is normal in width. There is no pleural effusion. The observed portions of the bony thorax appear normal.  IMPRESSION: There is no evidence of pneumonia nor CHF. Chronically increased interstitial markings likely reflect scarring though  certainly one cannot exclude superimposed acute bronchitis with subsegmental atelectasis. Followup films are recommended if the patient's symptoms do not improve with anticipated therapy.   Electronically Signed   By: David  Martinique   On: 09/01/2013 13:27   Dg Chest 2 View  08/29/2013   CLINICAL DATA:  Preop  EXAM: CHEST  2 VIEW  COMPARISON:  None.  FINDINGS: Lungs are clear. No pleural effusion or pneumothorax.  Cardiomegaly.  Mild degenerative changes of the visualized thoracolumbar spine.  IMPRESSION: No evidence of acute cardiopulmonary disease.   Electronically Signed   By: Julian Hy M.D.   On: 08/29/2013 14:25   US Renal  08/31/2013   CLINICAL DATA:  Elevated creatinine, hematuria, bladder mass  EXAM: RENAL/URINARY TRACT ULTRASOUND COMPLETE  COMPARISON:  CT abdomen pelvis dated 08/15/2013  FINDINGS: Right Kidney:  Length: 12.5 cm. Cortical thinning. Mild pelviectasis without frank hydronephrosis (image 7).  Left Kidney:  Length: 13.7 cm. Cortical thinning. 4.5 x 5.3 4.4 cm upper pole renal cyst. 2.2 x 2.6 cm lower pole renal cyst.  Mild hydronephrosis.  Bladder:  Decompressed by indwelling Foley catheter.  IMPRESSION: Mild left hydronephrosis.  Mild right pelviectasis without frank hydronephrosis.  Bladder is decompressed by indwelling Foley catheter.   Electronically Signed   By: Julian Hy M.D.   On: 08/31/2013 15:09      EKG Interpretation    Date/Time:  Wednesday September 17 2013 13:36:20 EST Ventricular Rate:  87 PR Interval:  189 QRS Duration: 113 QT Interval:  384 QTC Calculation: 462 R Axis:   -25 Text Interpretation:  Sinus rhythm Incomplete left bundle branch block No significant change since last tracing Confirmed by Ranessa Kosta  MD, Tyge Somers ([id number]) on 09/17/2013 2:15:25 PM            MDM  Iv ns. Labs.  Reviewed nursing notes and prior charts for additional history.   Discussed w urology, they request med service admit, they will consult re  hematuria.  Reviewed nursing notes and prior charts for additional history.   k high. ecg without acute change.  d50 iv, nahco3 iv, insulin iv, kayexlate po.   Med service called to admit.    Urology already aware of pt, and consult note on chart.  [facility] paged to consult as well  Recheck, pt alert, denies pain. No resp distress. bp normal.   Blood tinged urine flowing. No abd/suprapubic pain or distension.  Dr Louis Meckel, urology has seen in ed, and is addressing hematuria/obstructive process.   Urology and med service request [place] [facility] consult/follow pt - discussed pt with Dr Mercy Moore incl recent hx, baseline/current cr, elev k - they will consult.   Med service to admit.      Mirna Mires, MD 09/17/13 [phone]

## 2013-09-17 NOTE — ED Notes (Signed)
Attempt IV x 2 not successful requested 2nd RN to attempt

## 2013-09-17 NOTE — ED Notes (Signed)
MD at bedside. Dr. Alinda Money Urology present to assist with irrigation of foley. Assisting is Wellsite geologist and Conservator, museum/gallery with irrigation

## 2013-09-17 NOTE — ED Notes (Signed)
Per pt/family-recently diagnosed with prostate cancer--has foley placed today-increased clots, con continuous irrigation-sent here by Urology for monitoring

## 2013-09-17 NOTE — Consult Note (Signed)
Reason for Consult:AKI/CKD and hyperkalemia Referring Physician: Wyline Copas, MD  Samuel Garner is an 80 y.o. male.  HPI: Pt is a20yo WM with PMH sig for DM, HTN, CAD, hyperlipidemia, and CKD stage 3 (baseline Scr 1.8-2.2) who presented with gross hematuria in early December.  He was found to have a large bladder tumor by CT which was initially thought to be prostate CA.  He underwent TURP which was complicated by obstruction from blood clots and required a foley catheter for about a week until he was able to pass a voiding test.  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  He had an episode of AKI/CKD due to obstructive uropathy on 08/28/13 during this workup and had a foley placed which has since been removed as stated above.  His Scr peaked at 4.76 but decreased to 3.45 at time of discharge on 09/02/13.  He followed up with his PCP Dr. Delphina Cahill and was found to have a Scr of 9 yesterday.  He was instructed to f/u with Urology who sent him to [facility] ED.  While in the ED he was found to have a Scr of 11.39 and K of 6.4.  We were consulted to help further evaluate and manage his AKI/CKD and hyperkalemia.  He reports having a 24 hour h/o gross hematuria and urinary retention PTA, no NSAIDs/COX-II I's or IV contrast. Pt also denies any N/V/D/SOB/CP but does have weakness.  Trend in Creatinine:  Creatinine, Ser  Date/Time Value Range Status  09/17/2013  3:00 PM 11.39* 0.50 - 1.35 mg/dL Final  09/02/2013  4:30 AM 3.45* 0.50 - 1.35 mg/dL Final  09/01/2013  5:13 AM 4.37* 0.50 - 1.35 mg/dL Final  08/31/2013  9:29 AM 4.76* 0.50 - 1.35 mg/dL Final  08/30/2013  5:10 AM 4.30* 0.50 - 1.35 mg/dL Final  08/29/2013 12:50 PM 3.94* 0.50 - 1.35 mg/dL Final  08/28/2013  7:40 PM 3.73* 0.50 - 1.35 mg/dL Final  01/28/2013 11:33 AM 2.39* 0.50 - 1.35 mg/dL Final  11/28/2010  7:23 PM 1.54* 0.40-1.50 mg/dL Final  11/04/2010 11:12 AM 1.51   Final  09/22/2010 1.71    Final  07/15/2009 1.47   Final    PMH:   Past Medical History  Diagnosis Date  . Diabetes mellitus, type 2     with microalbuminuria  . Hypertension   . Arteriosclerotic cardiovascular disease (ASCVD)      2 vessel percutaneous transluminal coronary angioplasty in 3/93  . Hyperlipidemia     low HDL, high triglycerides  . Tobacco abuse, in remission     discontinued in 1993  . Gout   . Chronic kidney disease     creatinine 1.8 in 2002, 1.6 1n 2007 and 1.0 and 2008; deterioration in renal function; Secondary hyperparathyroidism  . Type IV renal tubular acidosis     With hyperkalemia  . Myocardial infarction     1993  . Small cell carcinoma of prostate     PSH:   Past Surgical History  Procedure Laterality Date  . Hernia repair    . Coronary angioplasty  1993  . Knee arthroscopy    . Transurethral resection of prostate N/A 08/29/2013    Procedure: TRANSURETHRAL RESECTION OF THE PROSTATE (TURP) and Clot evacuation;  Surgeon: Ardis Hughs, MD;  Location: [facility];  Service: Urology;  Laterality: N/A;    Allergies:  Allergies  Allergen Reactions  . Lisinopril     REACTION: acute renal failure: Patient is  not familiar with this medication or an allergy    Medications:   Prior to Admission medications   Medication Sig Start Date End Date Taking? Authorizing Provider  acetaminophen (TYLENOL) 500 MG tablet Take 500-1,000 mg by mouth every 6 (six) hours as needed for moderate pain or headache.    Yes Historical Provider, MD  allopurinol (ZYLOPRIM) 100 MG tablet Take 100 mg by mouth at bedtime.    Yes Historical Provider, MD  calcitRIOL (ROCALTROL) 0.25 MCG capsule Take 0.25 mcg by mouth every Monday, Wednesday, and Friday.    Yes Historical Provider, MD  Cholecalciferol (VITAMIN D3) 1000 UNITS CAPS Take 1,000 Units by mouth every morning.    Yes Historical Provider, MD  fenofibrate micronized (ANTARA) 43 MG capsule Take 43 mg by mouth every morning.  01/08/13  Yes Historical  Provider, MD  furosemide (LASIX) 40 MG tablet Take 40 mg by mouth every morning.   Yes Historical Provider, MD  metoprolol (LOPRESSOR) 100 MG tablet Take 100 mg by mouth 2 (two) times daily.   Yes Historical Provider, MD  Multiple Vitamins-Minerals (PRESERVISION/LUTEIN) CAPS Take 1 capsule by mouth at bedtime.    Yes Historical Provider, MD  niacin 500 MG tablet Take 500 mg by mouth at bedtime.   Yes Historical Provider, MD  pioglitazone (ACTOS) 30 MG tablet Take 30 mg by mouth every morning.    Yes Historical Provider, MD  pravastatin (PRAVACHOL) 40 MG tablet Take 40 mg by mouth at bedtime.   Yes Historical Provider, MD  verapamil (CALAN-SR) 120 MG CR tablet Take 1 tablet (120 mg total) by mouth at bedtime. 12/19/12  Yes Yehuda Savannah, MD  aspirin EC 81 MG tablet Take 81 mg by mouth daily.    Historical Provider, MD    Inpatient medications: . calcitRIOL  0.25 mcg Oral Q M,W,F  . fenofibrate  54 mg Oral Daily  . [START ON 09/18/2013] insulin aspart  0-15 Units Subcutaneous TID WC  . insulin aspart  0-5 Units Subcutaneous QHS  . insulin aspart  8 Units Intravenous Once  . metoprolol  100 mg Oral BID  . niacin  500 mg Oral QHS  . simvastatin  40 mg Oral q1800  . sodium polystyrene  30 g Oral Once  . [START ON 09/18/2013] Vitamin D3  1,000 Units Oral q morning - 10a    Discontinued Meds:   Medications Discontinued During This Encounter  Medication Reason  . Omega-3 Fatty Acids (FISH OIL) 1200 MG CAPS Discontinued by provider  . oxybutynin (DITROPAN XL) 5 MG 24 hr tablet Prescription never filled  . ciprofloxacin (CIPRO) 250 MG tablet Patient has not taken in last 30 days    Social History:  reports that he quit smoking about 22 years ago. He has never used smokeless tobacco. He reports that he does not drink alcohol or use illicit drugs.  Family History:   Family History  Problem Relation Age of Onset  . Diabetes Mother   . Heart attack Father     A comprehensive review of  systems was negative except for: Constitutional: positive for fatigue and malaise Cardiovascular: positive for lower extremity edema Genitourinary: positive for hematuria and urinary retention Weight change:  No intake or output data in the 24 hours ending 09/17/13 1740 BP 158/98  Pulse 87  Temp(Src) 98.6 F (37 C) (Oral)  Resp 20  SpO2 98% Filed Vitals:   09/17/13 1332  BP: 158/98  Pulse: 87  Temp: 98.6 F (37 C)  TempSrc:  Oral  Resp: 20  SpO2: 98%     General appearance: fatigued, no distress, moderately obese and pale Head: Normocephalic, without obvious abnormality, atraumatic Eyes: negative findings: lids and lashes normal, conjunctivae and sclerae normal and corneas clear Neck: no adenopathy, no carotid bruit, no JVD, supple, symmetrical, trachea midline and thyroid not enlarged, symmetric, no tenderness/mass/nodules Resp: clear to auscultation bilaterally Cardio: regular rate and rhythm, S1, S2 normal, no murmur, click, rub or gallop GI: obese, +BS, soft, +suprapubic fullness with some discomfort Extremities: edema 1+  Labs: Basic Metabolic Panel:  Recent Labs Lab 09/17/13 1500  NA 134*  K 6.4*  CL 96  CO2 12*  GLUCOSE 188*  BUN 88*  CREATININE 11.39*  ALBUMIN 2.7*  CALCIUM 9.2   Liver Function Tests:  Recent Labs Lab 09/17/13 1500  AST 10  ALT 7  ALKPHOS 58  BILITOT 0.3  PROT 6.8  ALBUMIN 2.7*   No results found for this basename: LIPASE, AMYLASE,  in the last 168 hours No results found for this basename: AMMONIA,  in the last 168 hours CBC:  Recent Labs Lab 09/17/13 1500  WBC 14.3*  HGB 7.7*  HCT 23.6*  MCV 87.1  PLT 327   PT/INR: @LABRCNTIP (inr:5) Cardiac Enzymes: )No results found for this basename: CKTOTAL, CKMB, CKMBINDEX, TROPONINI,  in the last 168 hours CBG: No results found for this basename: GLUCAP,  in the last 168 hours  Iron Studies: No results found for this basename: IRON, TIBC, TRANSFERRIN, FERRITIN,  in the last  168 hours  Xrays/Other Studies: No results found.   Assessment/Plan: 1.  AKI/CKD- most likely secondary to obstructive uropathy.  Had gross hematuria with large clots but doesn't appear to be draining his bladder much with CBI.  Worrisome for upper tract obstruction.  Will order a renal US as he may require percutaneous nephrostomy tubes if he has worsening hydro and no significant improvement of his Scr/K.  No emergent need for HD at this time and hopefully this can be avoided with bladder irrigation/drainage 2. Hyperkalemia- secondary to above.  Agree with foley cath and CBI as well as kayexalate.  Will also start bicarb given metabolic acidosis and cont to follow closely.  No peaked T waves and NSR 3. Metabolic acidosis- secondary to #1.  Will start isotonic bicarb and follow. 4. Anemia- ABLA/anemia of malignancy.  Transfuse as needed 5. 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 6. HTN- cont with outpt meds 7. Hyponatremia- due to #1 and peripheral edema. 8. DM- cont with insulin and COH-modified diet 9. Protein malnutrition 10. Deconditioning- would benefit from PT/OT eval   Maanvi Lecompte A 09/17/2013, 5:40 PM

## 2013-09-17 NOTE — ED Notes (Signed)
MD at bedside. Urology here to see pt. Irrigating foley

## 2013-09-18 ENCOUNTER — Telehealth: Payer: Self-pay | Admitting: Oncology

## 2013-09-18 ENCOUNTER — Encounter (HOSPITAL_COMMUNITY): Payer: Self-pay | Admitting: Radiology

## 2013-09-18 ENCOUNTER — Encounter: Payer: Self-pay | Admitting: *Deleted

## 2013-09-18 ENCOUNTER — Inpatient Hospital Stay (HOSPITAL_COMMUNITY): Payer: Medicare Other

## 2013-09-18 DIAGNOSIS — I10 Essential (primary) hypertension: Secondary | ICD-10-CM

## 2013-09-18 DIAGNOSIS — C61 Malignant neoplasm of prostate: Principal | ICD-10-CM

## 2013-09-18 DIAGNOSIS — R338 Other retention of urine: Secondary | ICD-10-CM

## 2013-09-18 LAB — URINE CULTURE
Colony Count: NO GROWTH
Culture: NO GROWTH

## 2013-09-18 LAB — PROTIME-INR
INR: 1.27 (ref 0.00–1.49)
PROTHROMBIN TIME: 15.6 s — AB (ref 11.6–15.2)

## 2013-09-18 LAB — GLUCOSE, CAPILLARY
GLUCOSE-CAPILLARY: 153 mg/dL — AB (ref 70–99)
GLUCOSE-CAPILLARY: 160 mg/dL — AB (ref 70–99)
GLUCOSE-CAPILLARY: 175 mg/dL — AB (ref 70–99)
Glucose-Capillary: 135 mg/dL — ABNORMAL HIGH (ref 70–99)

## 2013-09-18 LAB — COMPREHENSIVE METABOLIC PANEL
ALBUMIN: 2.3 g/dL — AB (ref 3.5–5.2)
ALT: 6 U/L (ref 0–53)
AST: 9 U/L (ref 0–37)
Alkaline Phosphatase: 46 U/L (ref 39–117)
BUN: 90 mg/dL — AB (ref 6–23)
CO2: 17 mEq/L — ABNORMAL LOW (ref 19–32)
Calcium: 8.5 mg/dL (ref 8.4–10.5)
Chloride: 103 mEq/L (ref 96–112)
Creatinine, Ser: 12.29 mg/dL — ABNORMAL HIGH (ref 0.50–1.35)
GFR calc non Af Amer: 3 mL/min — ABNORMAL LOW (ref >90)
GFR, EST AFRICAN AMERICAN: 4 mL/min — AB (ref >90)
Glucose, Bld: 173 mg/dL — ABNORMAL HIGH (ref 70–99)
Potassium: 5.6 mEq/L — ABNORMAL HIGH (ref 3.7–5.3)
SODIUM: 141 meq/L (ref 137–147)
TOTAL PROTEIN: 5.6 g/dL — AB (ref 6.0–8.3)
Total Bilirubin: 0.2 mg/dL — ABNORMAL LOW (ref 0.3–1.2)

## 2013-09-18 LAB — CBC
HEMATOCRIT: 18 % — AB (ref 39.0–52.0)
HEMOGLOBIN: 6.1 g/dL — AB (ref 13.0–17.0)
MCH: 29.2 pg (ref 26.0–34.0)
MCHC: 33.9 g/dL (ref 30.0–36.0)
MCV: 86.1 fL (ref 78.0–100.0)
Platelets: 255 10*3/uL (ref 150–400)
RBC: 2.09 MIL/uL — ABNORMAL LOW (ref 4.22–5.81)
RDW: 14.5 % (ref 11.5–15.5)
WBC: 10.1 10*3/uL (ref 4.0–10.5)

## 2013-09-18 LAB — RENAL FUNCTION PANEL
ALBUMIN: 2.3 g/dL — AB (ref 3.5–5.2)
BUN: 90 mg/dL — ABNORMAL HIGH (ref 6–23)
CHLORIDE: 101 meq/L (ref 96–112)
CO2: 19 mEq/L (ref 19–32)
Calcium: 8.8 mg/dL (ref 8.4–10.5)
Creatinine, Ser: 12.6 mg/dL — ABNORMAL HIGH (ref 0.50–1.35)
GFR, EST AFRICAN AMERICAN: 4 mL/min — AB (ref >90)
GFR, EST NON AFRICAN AMERICAN: 3 mL/min — AB (ref >90)
Glucose, Bld: 166 mg/dL — ABNORMAL HIGH (ref 70–99)
Phosphorus: 7.8 mg/dL — ABNORMAL HIGH (ref 2.3–4.6)
Potassium: 5.7 mEq/L — ABNORMAL HIGH (ref 3.7–5.3)
SODIUM: 143 meq/L (ref 137–147)

## 2013-09-18 LAB — PREPARE RBC (CROSSMATCH)

## 2013-09-18 MED ORDER — MIDAZOLAM HCL 2 MG/2ML IJ SOLN
INTRAMUSCULAR | Status: AC | PRN
  Administered 2013-09-18: 1 mg via INTRAVENOUS
  Administered 2013-09-18: 13:00:00 2 mg via INTRAVENOUS
  Administered 2013-09-18: 13:00:00 1 mg via INTRAVENOUS

## 2013-09-18 MED ORDER — HYDROCODONE-ACETAMINOPHEN 5-325 MG PO TABS
1.0000 | ORAL_TABLET | ORAL | Status: DC | PRN
  Administered 2013-09-19: 2 via ORAL
  Filled 2013-09-18: qty 2

## 2013-09-18 MED ORDER — FENTANYL CITRATE 0.05 MG/ML IJ SOLN
INTRAMUSCULAR | Status: AC
  Filled 2013-09-18: qty 6

## 2013-09-18 MED ORDER — FENTANYL CITRATE 0.05 MG/ML IJ SOLN
INTRAMUSCULAR | Status: AC
  Filled 2013-09-18: qty 8

## 2013-09-18 MED ORDER — MIDAZOLAM HCL 2 MG/2ML IJ SOLN
INTRAMUSCULAR | Status: AC
  Filled 2013-09-18: qty 8

## 2013-09-18 MED ORDER — MIDAZOLAM HCL 2 MG/2ML IJ SOLN
INTRAMUSCULAR | Status: AC
  Filled 2013-09-18: qty 6

## 2013-09-18 MED ORDER — FENTANYL CITRATE 0.05 MG/ML IJ SOLN
INTRAMUSCULAR | Status: AC | PRN
  Administered 2013-09-18: 50 ug via INTRAVENOUS
  Administered 2013-09-18: 100 ug via INTRAVENOUS

## 2013-09-18 MED ORDER — CEFAZOLIN SODIUM-DEXTROSE 2-3 GM-% IV SOLR
2.0000 g | Freq: Once | INTRAVENOUS | Status: AC
  Administered 2013-09-18: 11:00:00 2 g via INTRAVENOUS
  Filled 2013-09-18: qty 50

## 2013-09-18 MED ORDER — LIDOCAINE HCL 1 % IJ SOLN
INTRAMUSCULAR | Status: AC
  Filled 2013-09-18: qty 20

## 2013-09-18 NOTE — Progress Notes (Signed)
CRITICAL VALUE ALERT  Critical value received:  hgb 6.1   Date of notification:  09/18/13  Time of notification:  0512  Critical value read back:yes  Nurse who received alert:  Edilia Bo  MD notified (1st page):  Tylene Fantasia, NP   Time of first page:  0515  MD notified (2nd page):  Time of second page:  Responding MD:  Tylene Fantasia, NP   Time MD responded:  3244  Received orders to transfuse 2 units of RBC

## 2013-09-18 NOTE — Procedures (Signed)
Bilat percutaneous nephrostomy placement under fluoro No complication No blood loss. See complete dictation in [facility] PACS.  

## 2013-09-18 NOTE — Progress Notes (Signed)
3M Admitted on 09/17/13 for gross hematuria, acute on chronic renal failure, anemia, and hyperkalemia.  Patient recently diagnosed with aggressive prostate cancer and is awaiting any recommendations of treatment from multi-disciplinary prostate cancer clinic, held Friday 09/19/13.  Interval: Catheter irrigated several times since admission, continuous bladder irrigation appears to be running ok now. Potassium improving - creatinine continuing to rise Hgb down to 6.1 today, two units of PRBC ordred  Filed Vitals:   09/17/13 1730 09/17/13 1831 09/17/13 1928 09/17/13 2026  BP: 154/64 144/67 143/58 128/51  Pulse: 94 94 92 85  Temp:    98.4 F (36.9 C)  TempSrc:    Oral  Resp: 30 19 18 22   Height:    5\' 9"  (1.753 m)  Weight:    114.5 kg (252 lb 6.8 oz)  SpO2: 100% 99% 98% 100%   NAD Abdomen is soft Catheter draining clear urine on gentle CBI No CVA tenderness   Recent Labs  09/17/13 1500 09/18/13 0412  WBC 14.3* 10.1  HGB 7.7* 6.1*  HCT 23.6* 18.0*    Recent Labs  09/17/13 1500 09/17/13 2045 09/18/13 0412  NA 134* 138 141  K 6.4* 5.8* 5.6*  CL 96 97 103  CO2 12* 15* 17*  GLUCOSE 188* 248* 173*  BUN 88* 88* 90*  CREATININE 11.39* 11.73* 12.29*  CALCIUM 9.2 9.0 8.5   No results found for this basename: LABPT, INR,  in the last 72 hours No results found for this basename: LABURIN,  in the last 72 hours Results for orders placed during the hospital encounter of 08/28/13  URINE CULTURE     Status: None   Collection Time    08/28/13  6:55 PM      Result Value Range Status   Specimen Description URINE, CATHETERIZED   Final   Special Requests NONE   Final   Culture  Setup Time     Final   Value: 08/29/2013 13:46     Performed at [place]     Final   Value: NO GROWTH     Performed at [facility]   Culture     Final   Value: NO GROWTH     Performed at [facility]   Report Status 08/30/2013 FINAL   Final   Imp: Aggressive  CaP resulting in gross hematuria requiring CBI Anemia secondary to acute blood loss and chronic disease Acute on chronic renal failure - likely component of pre-renal from reduced hemoglobin and obstructive from prostate DM/CAD/PVD  Recommendations: Unfortunately his creatinine is continuing to rise despite foley catheterization.  I think that if the patient is willing to undergo bilateral neph tubes, this should be pursued.  They can potentially be internalized/converted to double J stents in the future.  We will wean the CBI as able and hopefully be able to avoid taking patient back to OR for clot evac and fulguration of bleeding area.  Agree with blood transfusion.  Appreciate the help/input from IM and nephrology.  Would recommend that patient keep his scheduled appt be taken to the cancer center on Friday morning at 7:45 by family while an inpatient for multi-disciplinary prostate cancer clinic for comprehensive discussion regarding treatment options - which at this point are very limited and likely to be palliative.

## 2013-09-18 NOTE — Progress Notes (Signed)
INITIAL NUTRITION ASSESSMENT  DOCUMENTATION CODES Per approved criteria  -Obesity Unspecified   INTERVENTION: Recommend Nepro BID as diet advancement tolerated Discussed supplement alternatives with family  NUTRITION DIAGNOSIS: Increased nutrient needs (protein/kcal) related to increased demand for nutrients as evidenced by  dx of prostate cancer.   Goal: Pt to meet >/= 90% of their estimated nutrition needs    Monitor:  Diet advancement, renal profile, Total protein/energy intake, labs, weights  Reason for Assessment: Malnutrition Screening Tool  80 y.o. male  Admitting Dx: Obstructive uropathy  ASSESSMENT: With a hx of CKD, DM, HTN, and prostate cancer who was recently discharged on 09/02/13 for hematuria. The patient presented for follow up with complaints of recurrent hematuria and urinary incontinence. The patient was noted to have just under 500cc of urine via bladder scan and a foley was placed. Bloodwork demonstrated a K of 6.4 with a Cr of over 11 (baseline of around 3). The patient was given glucose with insulin as well as kayexalate. Urology and nephrology were consulted. The hospitalist was consulted for admission.  -Pt's family reported pt with decreased appetite since previous hospital admit on 08/29/2013 -Consumes <25% of meals, only takes few bites -Noted feelings of early satiety and lack of interest in food. Denied nausea/vomiting/abd pain -Pt and family unsure if wt loss has occurred -Has not been on supplements previously, pt may benefit from Nepro as pt with minimal PO intake and elevated K and Phos -NPO for nephrostomy d/t no improvement in BUN/Crt. MD noted pt possible candidate for HD.  Height: Ht Readings from Last 1 Encounters:  09/17/13 5\' 9"  (1.753 m)    Weight: Wt Readings from Last 1 Encounters:  09/17/13 252 lb 6.8 oz (114.5 kg)    Ideal Body Weight: 160 lbss  % Ideal Body Weight: 158%  Wt Readings from Last 10 Encounters:  09/17/13  252 lb 6.8 oz (114.5 kg)  08/29/13 236 lb 15.9 oz (107.5 kg)  08/29/13 236 lb 15.9 oz (107.5 kg)  08/28/13 230 lb (104.327 kg)  01/28/13 231 lb (104.781 kg)  06/04/12 220 lb (99.791 kg)  01/29/12 221 lb (100.245 kg)  12/28/11 215 lb (97.523 kg)  11/04/10 204 lb (92.534 kg)  11/04/09 203 lb (92.08 kg)    Usual Body Weight: unable to determine  % Usual Body Weight: unable to determine  BMI:  Body mass index is 37.26 kg/(m^2). Obesity II  Estimated Nutritional Needs: Kcal: 2200-2400  Protein:70-85 gram Fluid:2300 ml/daily  Skin: WDL- peripheral edema  Diet Order: NPO  EDUCATION NEEDS: -No education needs identified at this time   Intake/Output Summary (Last 24 hours) at 09/18/13 1117 Last data filed at 09/18/13 1045  Gross per 24 hour  Intake 5254.58 ml  Output   7175 ml  Net -1920.42 ml    Last BM: 1/08   Labs:   Recent Labs Lab 09/17/13 1500 09/17/13 2045 09/18/13 0412  NA 134* 138 141  K 6.4* 5.8* 5.6*  CL 96 97 103  CO2 12* 15* 17*  BUN 88* 88* 90*  CREATININE 11.39* 11.73* 12.29*  CALCIUM 9.2 9.0 8.5  PHOS  --  7.5*  --   GLUCOSE 188* 248* 173*    CBG (last 3)   Recent Labs  09/17/13 2151 09/18/13 0719  GLUCAP 195* 153*    Scheduled Meds: . calcitRIOL  0.25 mcg Oral Q M,W,F  .  ceFAZolin (ANCEF) IV  2 g Intravenous Once  . cholecalciferol  1,000 Units Oral q morning - 10a  .  fenofibrate  54 mg Oral Daily  . insulin aspart  0-15 Units Subcutaneous TID WC  . insulin aspart  0-5 Units Subcutaneous QHS  . metoprolol  100 mg Oral BID  . niacin  500 mg Oral QHS  . simvastatin  40 mg Oral q1800    Continuous Infusions: . sodium chloride 20 mL/hr at 09/17/13 1736  .  sodium bicarbonate 150 mEq in sterile water 1000 mL infusion 75 mL/hr at 09/18/13 1047    Past Medical History  Diagnosis Date  . Diabetes mellitus, type 2     with microalbuminuria  . Hypertension   . Arteriosclerotic cardiovascular disease (ASCVD)      2 vessel  percutaneous transluminal coronary angioplasty in 3/93  . Hyperlipidemia     low HDL, high triglycerides  . Tobacco abuse, in remission     discontinued in 1993  . Gout   . Chronic kidney disease     creatinine 1.8 in 2002, 1.6 1n 2007 and 1.0 and 2008; deterioration in renal function; Secondary hyperparathyroidism  . Type IV renal tubular acidosis     With hyperkalemia  . Myocardial infarction     1993  . Small cell carcinoma of prostate     Past Surgical History  Procedure Laterality Date  . Hernia repair    . Coronary angioplasty  1993  . Knee arthroscopy    . Transurethral resection of prostate N/A 08/29/2013    Procedure: TRANSURETHRAL RESECTION OF THE PROSTATE (TURP) and Clot evacuation;  Surgeon: Ardis Hughs, MD;  Location: [facility];  Service: Urology;  Laterality: N/A;    Atlee Abide MS RD LDN Clinical Dietitian [phone]

## 2013-09-18 NOTE — Progress Notes (Signed)
[facility] PROGRESS NOTE  Samuel Garner ZJI:[id number] DOB: Dec 09, 1932 DOA: 09/17/2013 PCP: Delphina Cahill, MD  Assessment/Plan: 1. Acute renal failure  1. Likely post-obstructive, concerns for upper tract obstruction 2. Cont with IVF as tolerated 3. Nephrology consulted 4. Hold nephrotoxic drugs (including ACEI from home med list) 5. Renal fx worsened despite foley cath 6. Appreciate Nephrology and Urology input - for nephrostomy tubes 7. If no improvement with renal function, plans for transfer to [facility] for initiation of HD 2. Hyperkalemia  1. S/p insulin and kayexalate in the ED 2. Improved 3. DM  1. Cont with SSI as tolerated 4. HTN  1. Stable 2. Cont regimen 5. Hx gout  1. Hold allopurinol given ARF 6. Hematuria  1. S/p foley cath 2. Urology following 7. Anemia  1. Follow CBC 2. Likely related to hematuria 3. S/p transfusion this AM 8. Prostate cancer 1. Urology recommendations for keeping appt at cancer center on 09/19/13 to discuss treatment options w/ oncology team 9. DVT prophylaxis  1. SCD's for prophylaxis   Code Status: Full Family Communication: Pt in room (indicate person spoken with, relationship, and if by phone, the number) Disposition Plan: Pending   Consultants:  Nephrology  Urology  Procedures:  CBI per Urology   HPI/Subjective: No acute events noted overnight  Objective: Filed Vitals:   09/18/13 0755 09/18/13 0855 09/18/13 0958 09/18/13 1059  BP: 145/67 142/76 135/85 136/78  Pulse: 76 79 80 78  Temp: 98.2 F (36.8 C) 98 F (36.7 C) 97.6 F (36.4 C) 98 F (36.7 C)  TempSrc: Oral Oral Oral Oral  Resp:  18 18 20   Height:      Weight:      SpO2: 100% 98% 99% 100%    Intake/Output Summary (Last 24 hours) at 09/18/13 1124 Last data filed at 09/18/13 1045  Gross per 24 hour  Intake 5254.58 ml  Output   7175 ml  Net -1920.42 ml   Filed Weights   09/17/13 2026  Weight: 114.5 kg (252 lb 6.8 oz)    Exam:   General:   Awake, in nad  Cardiovascular: regular, s1, s2  Respiratory: normal resp effort, no wheezing  Abdomen: soft, nondistended  Musculoskeletal: perfused, no clubbing   Data Reviewed: Basic Metabolic Panel:  Recent Labs Lab 09/17/13 1500 09/17/13 2045 09/18/13 0412  NA 134* 138 141  K 6.4* 5.8* 5.6*  CL 96 97 103  CO2 12* 15* 17*  GLUCOSE 188* 248* 173*  BUN 88* 88* 90*  CREATININE 11.39* 11.73* 12.29*  CALCIUM 9.2 9.0 8.5  PHOS  --  7.5*  --    Liver Function Tests:  Recent Labs Lab 09/17/13 1500 09/17/13 2045 09/18/13 0412  AST 10  --  9  ALT 7  --  6  ALKPHOS 58  --  46  BILITOT 0.3  --  0.2*  PROT 6.8  --  5.6*  ALBUMIN 2.7* 2.5* 2.3*   No results found for this basename: LIPASE, AMYLASE,  in the last 168 hours No results found for this basename: AMMONIA,  in the last 168 hours CBC:  Recent Labs Lab 09/17/13 1500 09/18/13 0412  WBC 14.3* 10.1  HGB 7.7* 6.1*  HCT 23.6* 18.0*  MCV 87.1 86.1  PLT 327 255   Cardiac Enzymes: No results found for this basename: CKTOTAL, CKMB, CKMBINDEX, TROPONINI,  in the last 168 hours BNP (last 3 results)  Recent Labs  09/01/13 0513  PROBNP 2536.0*   CBG:  Recent Labs  Lab 09/17/13 2151 09/18/13 0719  GLUCAP 195* 153*    No results found for this or any previous visit (from the past 240 hour(s)).   Studies: US Renal  09/17/2013   CLINICAL DATA:  Hematuria  EXAM: RENAL/URINARY TRACT ULTRASOUND COMPLETE  COMPARISON:  08/31/2013  FINDINGS: Right Kidney:  Length: 11.5 cm in length. Mild hydronephrosis is not significantly changed. No mass. Moderate cortical thinning.  Left Kidney:  Length: 13.0 cm in length. Mild hydronephrosis is not significantly changed. Moderate cortical thinning. No mass.  Bladder:  Foley catheter decompresses the bladder.  IMPRESSION: Stable mild bilateral hydronephrosis.   Electronically Signed   By: Maryclare Bean M.D.   On: 09/17/2013 20:06    Scheduled Meds: . calcitRIOL  0.25 mcg Oral Q  M,W,F  .  ceFAZolin (ANCEF) IV  2 g Intravenous Once  . cholecalciferol  1,000 Units Oral q morning - 10a  . fenofibrate  54 mg Oral Daily  . insulin aspart  0-15 Units Subcutaneous TID WC  . insulin aspart  0-5 Units Subcutaneous QHS  . metoprolol  100 mg Oral BID  . niacin  500 mg Oral QHS  . simvastatin  40 mg Oral q1800   Continuous Infusions: . sodium chloride 20 mL/hr at 09/17/13 1736  .  sodium bicarbonate 150 mEq in sterile water 1000 mL infusion 75 mL/hr at 09/18/13 1047    Principal Problem:   Obstructive uropathy Active Problems:   Diabetes mellitus, type 2   Hypertension   Chronic kidney disease   Gross hematuria   ARF (acute renal failure)  Time spent: 1min  [facility], [facility] Pager [phone]. If 7PM-7AM, please contact night-coverage at [email or web address], password [facility] 09/18/2013, 11:24 AM  LOS: 1 day

## 2013-09-18 NOTE — Telephone Encounter (Signed)
C/D 09/18/13 for appt. 09/19/13

## 2013-09-18 NOTE — Progress Notes (Signed)
Patient ID: Samuel Garner, male   DOB: 03-21-1933, 80 y.o.   MRN: [id number] S:Feels a little better and is now getting a blood transfusion, however still appears pale and fatigued O:BP 145/67  Pulse 76  Temp(Src) 98.2 F (36.8 C) (Oral)  Resp 20  Ht 5\' 9"  (1.753 m)  Wt 114.5 kg (252 lb 6.8 oz)  BMI 37.26 kg/m2  SpO2 100%  Intake/Output Summary (Last 24 hours) at 09/18/13 0820 Last data filed at 09/18/13 0700  Gross per 24 hour  Intake 4858.75 ml  Output   6075 ml  Net -1216.25 ml   Intake/Output: I/O last 3 completed shifts: In: 4858.8 [P.O.:360; I.V.:498.8; Other:4000] Out: 6075 [Urine:6075]  Intake/Output this shift:    Weight change:  Gen:WD obese, WM, pale, fatigued but in NAD CVS:RRR no rub Resp:decreased BS  WNU:[id number], +BS, tender and full bladder upon palpation of suprapubic region Ext:1+ edema   Recent Labs Lab 09/17/13 1500 09/17/13 2045 09/18/13 0412  NA 134* 138 141  K 6.4* 5.8* 5.6*  CL 96 97 103  CO2 12* 15* 17*  GLUCOSE 188* 248* 173*  BUN 88* 88* 90*  CREATININE 11.39* 11.73* 12.29*  ALBUMIN 2.7* 2.5* 2.3*  CALCIUM 9.2 9.0 8.5  PHOS  --  7.5*  --   AST 10  --  9  ALT 7  --  6   Liver Function Tests:  Recent Labs Lab 09/17/13 1500 09/17/13 2045 09/18/13 0412  AST 10  --  9  ALT 7  --  6  ALKPHOS 58  --  46  BILITOT 0.3  --  0.2*  PROT 6.8  --  5.6*  ALBUMIN 2.7* 2.5* 2.3*   No results found for this basename: LIPASE, AMYLASE,  in the last 168 hours No results found for this basename: AMMONIA,  in the last 168 hours CBC:  Recent Labs Lab 09/17/13 1500 09/18/13 0412  WBC 14.3* 10.1  HGB 7.7* 6.1*  HCT 23.6* 18.0*  MCV 87.1 86.1  PLT 327 255   Cardiac Enzymes: No results found for this basename: CKTOTAL, CKMB, CKMBINDEX, TROPONINI,  in the last 168 hours CBG:  Recent Labs Lab 09/17/13 2151 09/18/13 0719  GLUCAP 195* 153*    Iron Studies: No results found for this basename: IRON, TIBC, TRANSFERRIN, FERRITIN,  in  the last 72 hours Studies/Results: US Renal  09/17/2013   CLINICAL DATA:  Hematuria  EXAM: RENAL/URINARY TRACT ULTRASOUND COMPLETE  COMPARISON:  08/31/2013  FINDINGS: Right Kidney:  Length: 11.5 cm in length. Mild hydronephrosis is not significantly changed. No mass. Moderate cortical thinning.  Left Kidney:  Length: 13.0 cm in length. Mild hydronephrosis is not significantly changed. Moderate cortical thinning. No mass.  Bladder:  Foley catheter decompresses the bladder.  IMPRESSION: Stable mild bilateral hydronephrosis.   Electronically Signed   By: Maryclare Bean M.D.   On: 09/17/2013 20:06   . calcitRIOL  0.25 mcg Oral Q M,W,F  . cholecalciferol  1,000 Units Oral q morning - 10a  . fenofibrate  54 mg Oral Daily  . insulin aspart  0-15 Units Subcutaneous TID WC  . insulin aspart  0-5 Units Subcutaneous QHS  . metoprolol  100 mg Oral BID  . niacin  500 mg Oral QHS  . simvastatin  40 mg Oral q1800    BMET    Component Value Date/Time   NA 141 09/18/2013 0412   K 5.6* 09/18/2013 0412   CL 103 09/18/2013 0412   CO2 17* 09/18/2013  2536   GLUCOSE 173* 09/18/2013 0412   BUN 90* 09/18/2013 0412   CREATININE 12.29* 09/18/2013 0412   CREATININE 2.39* 01/28/2013 1133   CALCIUM 8.5 09/18/2013 0412   GFRNONAA 3* 09/18/2013 0412   GFRAA 4* 09/18/2013 0412   CBC    Component Value Date/Time   WBC 10.1 09/18/2013 0412   RBC 2.09* 09/18/2013 0412   HGB 6.1* 09/18/2013 0412   HCT 18.0* 09/18/2013 0412   PLT 255 09/18/2013 0412   MCV 86.1 09/18/2013 0412   MCH 29.2 09/18/2013 0412   MCHC 33.9 09/18/2013 0412   RDW 14.5 09/18/2013 0412   LYMPHSABS 1.2 09/22/2010   MONOABS 0.6 09/22/2010   EOSABS 0.2 09/22/2010   BASOSABS 0.0 09/22/2010     Assessment/Plan:  1. AKI/CKD/Obstructive uropathy- most likely secondary to obstructive uropathy. Had gross hematuria with large clots but doesn't appear to be draining his bladder much with CBI. Worrisome for upper tract obstruction.  No improvement of BUN/CR despite UOP.  No nephrtoxic agents  identified.   1. Renal US showed bilateral hydro although not significantly different, however he has not responded to Foley cath and CBI.  His BUN/Cr cont to increase and agree with Dr. Louis Meckel that an attempt to improve his renal function with bilateral PNT would be prudent.  I discussed this with the patient and his daughter and they are amenable to the plan.  I did discuss with them that if this does not improve his renal function, he will need to be transferred to [facility] tomorrow for initiation of HD.   2. No emergent need for HD at this time and hopefully this can be avoided with PNT's however we will cont to follow closely.   2. Hyperkalemia- secondary to above. Improved somewhat and will follow post PNT. Also on bicarb gtt given metabolic acidosis and cont to follow closely. No peaked T waves and NSR 3. Metabolic acidosis- secondary to #1. Will start isotonic bicarb and follow. 4. Anemia- ABLA/anemia of malignancy.  1. Currently receiving blood transfusion 5. 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  I briefly mentioned palliative care as an option but will await tumor board's review 6. HTN- cont with outpt meds 7. Hyponatremia- due to #1 and peripheral edema. 8. DM- cont with insulin and COH-modified diet 9. Protein malnutrition 10. Deconditioning- would benefit from PT/OT eval 11.   Strawn A

## 2013-09-18 NOTE — H&P (Signed)
Referring Physician: Dr. Marval Regal HPI: Samuel Garner is an 80 y.o. male who was seen by urology on 09/17/13 with referral from PCP secondary to c/o gross hematuria x 1 month, lower abdominal pain and abnormal CT abdomen/pelvis on 08/15/13. The patient was found to have prostate cancer, follow-up renal US performed on 09/17/13 demonstrate bilateral hydronephrosis. The patient's renal function has worsened with Cr of 12 today. Nephrology has requested an image guided bilateral percutaneous nephrostomy tube placement, urology has agreed with proceeding with this procedure. The patient denies any active lightheadedness, dizziness, shortness of breath, palpitations, or chest pain. He denies any fever or chills. He does still admit to hematuria however this has improved. He denies any previous complications with sedation during a heart catheterization. He denies any history of sleep apnea or chronic home oxygen.   Past Medical History:  Past Medical History  Diagnosis Date  . Diabetes mellitus, type 2     with microalbuminuria  . Hypertension   . Arteriosclerotic cardiovascular disease (ASCVD)      2 vessel percutaneous transluminal coronary angioplasty in 3/93  . Hyperlipidemia     low HDL, high triglycerides  . Tobacco abuse, in remission     discontinued in 1993  . Gout   . Chronic kidney disease     creatinine 1.8 in 2002, 1.6 1n 2007 and 1.0 and 2008; deterioration in renal function; Secondary hyperparathyroidism  . Type IV renal tubular acidosis     With hyperkalemia  . Myocardial infarction     1993  . Small cell carcinoma of prostate     Past Surgical History:  Past Surgical History  Procedure Laterality Date  . Hernia repair    . Coronary angioplasty  1993  . Knee arthroscopy    . Transurethral resection of prostate N/A 08/29/2013    Procedure: TRANSURETHRAL RESECTION OF THE PROSTATE (TURP) and Clot evacuation;  Surgeon: Ardis Hughs, MD;  Location: [facility];  Service: Urology;   Laterality: N/A;    Family History:  Family History  Problem Relation Age of Onset  . Diabetes Mother   . Heart attack Father     Social History:  reports that he quit smoking about 22 years ago. He has never used smokeless tobacco. He reports that he does not drink alcohol or use illicit drugs.  Allergies:  Allergies  Allergen Reactions  . Lisinopril     REACTION: acute renal failure: Patient is not familiar with this medication or an allergy      Medication List    ASK your doctor about these medications       acetaminophen 500 MG tablet  Commonly known as:  TYLENOL  Take 500-1,000 mg by mouth every 6 (six) hours as needed for moderate pain or headache.     allopurinol 100 MG tablet  Commonly known as:  ZYLOPRIM  Take 100 mg by mouth at bedtime.     aspirin EC 81 MG tablet  Take 81 mg by mouth daily.     calcitRIOL 0.25 MCG capsule  Commonly known as:  ROCALTROL  Take 0.25 mcg by mouth every Monday, Wednesday, and Friday.     fenofibrate micronized 43 MG capsule  Commonly known as:  ANTARA  Take 43 mg by mouth every morning.     furosemide 40 MG tablet  Commonly known as:  LASIX  Take 40 mg by mouth every morning.     metoprolol 100 MG tablet  Commonly known as:  LOPRESSOR  Take  100 mg by mouth 2 (two) times daily.     niacin 500 MG tablet  Take 500 mg by mouth at bedtime.     pioglitazone 30 MG tablet  Commonly known as:  ACTOS  Take 30 mg by mouth every morning.     pravastatin 40 MG tablet  Commonly known as:  PRAVACHOL  Take 40 mg by mouth at bedtime.     PRESERVISION/LUTEIN Caps  Take 1 capsule by mouth at bedtime.     verapamil 120 MG CR tablet  Commonly known as:  CALAN-SR  Take 1 tablet (120 mg total) by mouth at bedtime.     Vitamin D3 1000 UNITS Caps  Take 1,000 Units by mouth every morning.       Please HPI for pertinent positives, otherwise complete 10 system ROS negative.  Physical Exam: BP 135/85  Pulse 80  Temp(Src) 97.6  F (36.4 C) (Oral)  Resp 18  Ht _0  (1.753 m)  Wt 252 lb 6.8 oz (114.5 kg)  BMI 37.26 kg/m2  SpO2 99% Body mass index is 37.26 kg/(m^2).  General Appearance:  Alert, cooperative, no distress  Head:  Normocephalic, without obvious abnormality, atraumatic  Neck: Supple, symmetrical, trachea midline  Lungs:   Clear to auscultation bilaterally, no w/r/r, respirations unlabored without use of accessory muscles.  Chest Wall:  No tenderness or deformity  Heart:  Regular rate and rhythm, S1, S2 normal, no murmur, rub or gallop.  Abdomen:   Soft, non-tender, non distended, (+) BS  Extremities: Extremities normal, atraumatic, no cyanosis or edema  Neurologic: Normal affect, no gross deficits.   Results for orders placed during the hospital encounter of 09/17/13 (from the past 48 hour(s))  CBC     Status: Abnormal   Collection Time    09/17/13  3:00 PM      Result Value Range   WBC 14.3 (*) 4.0 - 10.5 K/uL   RBC 2.71 (*) 4.22 - 5.81 MIL/uL   Hemoglobin 7.7 (*) 13.0 - 17.0 g/dL   HCT 23.6 (*) 39.0 - 52.0 %   MCV 87.1  78.0 - 100.0 fL   MCH 28.4  26.0 - 34.0 pg   MCHC 32.6  30.0 - 36.0 g/dL   RDW 14.5  11.5 - 15.5 %   Platelets 327  150 - 400 K/uL  COMPREHENSIVE METABOLIC PANEL     Status: Abnormal   Collection Time    09/17/13  3:00 PM      Result Value Range   Sodium 134 (*) 137 - 147 mEq/L   Potassium 6.4 (*) 3.7 - 5.3 mEq/L   Chloride 96  96 - 112 mEq/L   CO2 12 (*) 19 - 32 mEq/L   Glucose, Bld 188 (*) 70 - 99 mg/dL   BUN 88 (*) 6 - 23 mg/dL   Creatinine, Ser 11.39 (*) 0.50 - 1.35 mg/dL   Calcium 9.2  8.4 - 10.5 mg/dL   Total Protein 6.8  6.0 - 8.3 g/dL   Albumin 2.7 (*) 3.5 - 5.2 g/dL   AST 10  0 - 37 U/L   ALT 7  0 - 53 U/L   Alkaline Phosphatase 58  39 - 117 U/L   Total Bilirubin 0.3  0.3 - 1.2 mg/dL   GFR calc non Af Amer 4 (*) >90 mL/min   GFR calc Af Amer 4 (*) >90 mL/min   Comment: (NOTE)     The eGFR has been calculated using the CKD EPI equation.  This  calculation has not been validated in all clinical situations.     eGFR's persistently <90 mL/min signify possible Chronic Kidney     Disease.  TYPE AND SCREEN     Status: None   Collection Time    09/17/13  3:00 PM      Result Value Range   ABO/RH(D) O POS     Antibody Screen NEG     Sample Expiration 09/20/2013     Unit Number [id number]     Blood Component Type RED CELLS,LR     Unit division 00     Status of Unit ISSUED     Transfusion Status OK TO TRANSFUSE     Crossmatch Result Compatible     Unit Number [id number]     Blood Component Type RED CELLS,LR     Unit division 00     Status of Unit ALLOCATED     Transfusion Status OK TO TRANSFUSE     Crossmatch Result Compatible    URINALYSIS, ROUTINE W REFLEX MICROSCOPIC     Status: Abnormal   Collection Time    09/17/13  3:20 PM      Result Value Range   Color, Urine RED (*) YELLOW   Comment: BIOCHEMICALS MAY BE AFFECTED BY COLOR   APPearance CLOUDY (*) CLEAR   Specific Gravity, Urine 1.010  1.005 - 1.030   pH 7.0  5.0 - 8.0   Glucose, UA NEGATIVE  NEGATIVE mg/dL   Hgb urine dipstick LARGE (*) NEGATIVE   Bilirubin Urine LARGE (*) NEGATIVE   Ketones, ur 40 (*) NEGATIVE mg/dL   Protein, ur >300 (*) NEGATIVE mg/dL   Urobilinogen, UA 0.2  0.0 - 1.0 mg/dL   Nitrite POSITIVE (*) NEGATIVE   Leukocytes, UA MODERATE (*) NEGATIVE  URINE MICROSCOPIC-ADD ON     Status: None   Collection Time    09/17/13  3:20 PM      Result Value Range   Squamous Epithelial / LPF RARE  RARE   WBC, UA 0-2  <3 WBC/hpf   RBC / HPF TOO NUMEROUS TO COUNT  <3 RBC/hpf   Bacteria, UA RARE  RARE   Urine-Other URINALYSIS PERFORMED ON SUPERNATANT     Comment: MICROSCOPIC EXAM PERFORMED ON UNCONCENTRATED URINE  RENAL FUNCTION PANEL     Status: Abnormal   Collection Time    09/17/13  8:45 PM      Result Value Range   Sodium 138  137 - 147 mEq/L   Comment: CONSISTENT WITH PREVIOUS RESULT   Potassium 5.8 (*) 3.7 - 5.3 mEq/L   Chloride 97  96 - 112  mEq/L   Comment: CONSISTENT WITH PREVIOUS RESULT   CO2 15 (*) 19 - 32 mEq/L   Comment: CONSISTENT WITH PREVIOUS RESULT   Glucose, Bld 248 (*) 70 - 99 mg/dL   BUN 88 (*) 6 - 23 mg/dL   Creatinine, Ser 11.73 (*) 0.50 - 1.35 mg/dL   Calcium 9.0  8.4 - 10.5 mg/dL   Phosphorus 7.5 (*) 2.3 - 4.6 mg/dL   Albumin 2.5 (*) 3.5 - 5.2 g/dL   GFR calc non Af Amer 4 (*) >90 mL/min   GFR calc Af Amer 4 (*) >90 mL/min   Comment: (NOTE)     The eGFR has been calculated using the CKD EPI equation.     This calculation has not been validated in all clinical situations.     eGFR's persistently <90 mL/min signify possible Chronic Kidney  Disease.  GLUCOSE, CAPILLARY     Status: Abnormal   Collection Time    09/17/13  9:51 PM      Result Value Range   Glucose-Capillary 195 (*) 70 - 99 mg/dL  COMPREHENSIVE METABOLIC PANEL     Status: Abnormal   Collection Time    09/18/13  4:12 AM      Result Value Range   Sodium 141  137 - 147 mEq/L   Potassium 5.6 (*) 3.7 - 5.3 mEq/L   Chloride 103  96 - 112 mEq/L   CO2 17 (*) 19 - 32 mEq/L   Glucose, Bld 173 (*) 70 - 99 mg/dL   BUN 90 (*) 6 - 23 mg/dL   Creatinine, Ser 12.29 (*) 0.50 - 1.35 mg/dL   Calcium 8.5  8.4 - 10.5 mg/dL   Total Protein 5.6 (*) 6.0 - 8.3 g/dL   Albumin 2.3 (*) 3.5 - 5.2 g/dL   AST 9  0 - 37 U/L   ALT 6  0 - 53 U/L   Alkaline Phosphatase 46  39 - 117 U/L   Total Bilirubin 0.2 (*) 0.3 - 1.2 mg/dL   GFR calc non Af Amer 3 (*) >90 mL/min   GFR calc Af Amer 4 (*) >90 mL/min   Comment: (NOTE)     The eGFR has been calculated using the CKD EPI equation.     This calculation has not been validated in all clinical situations.     eGFR's persistently <90 mL/min signify possible Chronic Kidney     Disease.  CBC     Status: Abnormal   Collection Time    09/18/13  4:12 AM      Result Value Range   WBC 10.1  4.0 - 10.5 K/uL   RBC 2.09 (*) 4.22 - 5.81 MIL/uL   Hemoglobin 6.1 (*) 13.0 - 17.0 g/dL   Comment: DELTA CHECK NOTED     REPEATED  TO VERIFY     CRITICAL RESULT CALLED TO, READ BACK BY AND VERIFIED WITH:     NICHOLS,T/4W _0  ON 09/18/13 BY KARCZEWSKI,S.   HCT 18.0 (*) 39.0 - 52.0 %   MCV 86.1  78.0 - 100.0 fL   MCH 29.2  26.0 - 34.0 pg   MCHC 33.9  30.0 - 36.0 g/dL   RDW 14.5  11.5 - 15.5 %   Platelets 255  150 - 400 K/uL  PREPARE RBC (CROSSMATCH)     Status: None   Collection Time    09/18/13  6:00 AM      Result Value Range   Order Confirmation ORDER PROCESSED BY BLOOD BANK    GLUCOSE, CAPILLARY     Status: Abnormal   Collection Time    09/18/13  7:19 AM      Result Value Range   Glucose-Capillary 153 (*) 70 - 99 mg/dL   US Renal  09/17/2013   CLINICAL DATA:  Hematuria  EXAM: RENAL/URINARY TRACT ULTRASOUND COMPLETE  COMPARISON:  08/31/2013  FINDINGS: Right Kidney:  Length: 11.5 cm in length. Mild hydronephrosis is not significantly changed. No mass. Moderate cortical thinning.  Left Kidney:  Length: 13.0 cm in length. Mild hydronephrosis is not significantly changed. Moderate cortical thinning. No mass.  Bladder:  Foley catheter decompresses the bladder.  IMPRESSION: Stable mild bilateral hydronephrosis.   Electronically Signed   By: Maryclare Bean M.D.   On: 09/17/2013 20:06    Assessment/Plan Prostate cancer Bilateral hydronephrosis Acute on chronic renal failure Cr 12.29 today.  Hyperkalemia Anemia hgb 6.1, currently being transfused 2 units of PRBC Hematuria, post foley irrigation, improving. Request for image guided bilateral percutaneous nephrostomy tube placement. Patient has been NPO, no blood thinners, ancef ordered per Dr. Louis Meckel. Risks and Benefits discussed with the patient and his family. All of the patient's questions were answered, patient is agreeable to proceed. Consent signed and in chart.   Tsosie Billing D PA-C 09/18/2013, 10:11 AM

## 2013-09-19 ENCOUNTER — Encounter: Payer: Self-pay | Admitting: *Deleted

## 2013-09-19 ENCOUNTER — Ambulatory Visit
Admission: RE | Admit: 2013-09-19 | Discharge: 2013-09-19 | Disposition: A | Discharge status: Home / Self Care | Payer: Medicare Other | Source: Ambulatory Visit | Attending: Radiation Oncology | Admitting: Radiation Oncology

## 2013-09-19 ENCOUNTER — Telehealth: Payer: Self-pay

## 2013-09-19 ENCOUNTER — Ambulatory Visit: Payer: Medicare Other | Admitting: Oncology

## 2013-09-19 ENCOUNTER — Ambulatory Visit
Admit: 2013-09-19 | Discharge: 2013-09-19 | Disposition: A | Discharge status: Home / Self Care | Payer: Medicare Other | Source: Ambulatory Visit | Attending: Radiation Oncology | Admitting: Radiation Oncology

## 2013-09-19 DIAGNOSIS — N139 Obstructive and reflux uropathy, unspecified: Secondary | ICD-10-CM | POA: Insufficient documentation

## 2013-09-19 DIAGNOSIS — C61 Malignant neoplasm of prostate: Secondary | ICD-10-CM | POA: Insufficient documentation

## 2013-09-19 DIAGNOSIS — N179 Acute kidney failure, unspecified: Secondary | ICD-10-CM

## 2013-09-19 DIAGNOSIS — N320 Bladder-neck obstruction: Secondary | ICD-10-CM

## 2013-09-19 DIAGNOSIS — R319 Hematuria, unspecified: Secondary | ICD-10-CM | POA: Insufficient documentation

## 2013-09-19 DIAGNOSIS — C7A1 Malignant poorly differentiated neuroendocrine tumors: Secondary | ICD-10-CM

## 2013-09-19 DIAGNOSIS — Z510 Encounter for antineoplastic radiation therapy: Secondary | ICD-10-CM | POA: Insufficient documentation

## 2013-09-19 DIAGNOSIS — Z794 Long term (current) use of insulin: Secondary | ICD-10-CM | POA: Insufficient documentation

## 2013-09-19 DIAGNOSIS — Z79899 Other long term (current) drug therapy: Secondary | ICD-10-CM | POA: Insufficient documentation

## 2013-09-19 DIAGNOSIS — Z01810 Encounter for preprocedural cardiovascular examination: Secondary | ICD-10-CM | POA: Insufficient documentation

## 2013-09-19 LAB — GLUCOSE, CAPILLARY
GLUCOSE-CAPILLARY: 160 mg/dL — AB (ref 70–99)
GLUCOSE-CAPILLARY: 183 mg/dL — AB (ref 70–99)
GLUCOSE-CAPILLARY: 179 mg/dL — AB (ref 70–99)
Glucose-Capillary: 211 mg/dL — ABNORMAL HIGH (ref 70–99)
Glucose-Capillary: 194 mg/dL — ABNORMAL HIGH (ref 70–99)

## 2013-09-19 LAB — TYPE AND SCREEN
ABO/RH(D): O POS
ANTIBODY SCREEN: NEGATIVE
Unit division: 0
Unit division: 0

## 2013-09-19 LAB — CBC
HCT: 21.4 % — ABNORMAL LOW (ref 39.0–52.0)
Hemoglobin: 7.3 g/dL — ABNORMAL LOW (ref 13.0–17.0)
MCH: 29.2 pg (ref 26.0–34.0)
MCHC: 34.1 g/dL (ref 30.0–36.0)
MCV: 85.6 fL (ref 78.0–100.0)
Platelets: 253 10*3/uL (ref 150–400)
RBC: 2.5 MIL/uL — AB (ref 4.22–5.81)
RDW: 14.5 % (ref 11.5–15.5)
WBC: 11.9 10*3/uL — AB (ref 4.0–10.5)

## 2013-09-19 LAB — RENAL FUNCTION PANEL
ALBUMIN: 2.3 g/dL — AB (ref 3.5–5.2)
BUN: 79 mg/dL — ABNORMAL HIGH (ref 6–23)
CO2: 20 mEq/L (ref 19–32)
Calcium: 8.6 mg/dL (ref 8.4–10.5)
Chloride: 100 mEq/L (ref 96–112)
Creatinine, Ser: 9.69 mg/dL — ABNORMAL HIGH (ref 0.50–1.35)
GFR calc Af Amer: 5 mL/min — ABNORMAL LOW (ref >90)
GFR, EST NON AFRICAN AMERICAN: 4 mL/min — AB (ref >90)
Glucose, Bld: 181 mg/dL — ABNORMAL HIGH (ref 70–99)
PHOSPHORUS: 6.2 mg/dL — AB (ref 2.3–4.6)
Potassium: 4.2 mEq/L (ref 3.7–5.3)
Sodium: 142 mEq/L (ref 137–147)

## 2013-09-19 MED ORDER — NEPRO/CARBSTEADY PO LIQD
237.0000 mL | ORAL | Status: DC
Start: 2013-09-19 — End: 2013-09-26
  Administered 2013-09-19 – 2013-09-26 (×7): 237 mL via ORAL
  Filled 2013-09-19 (×8): qty 237

## 2013-09-19 NOTE — Progress Notes (Signed)
43M Admitted on 09/17/13 for gross hematuria, acute on chronic renal failure, anemia, and hyperkalemia.  Patient recently diagnosed with aggressive prostate cancer and is awaiting any recommendations of treatment from multi-disciplinary prostate cancer clinic, held Friday 09/19/13.  Interval: Bilateral PCNs placed yesterday - initial small post-obstructive diuresis, which has slowed since.  Patient tolerated procedure and is comfortable this AM. Slept well, no significant complaints. No acute events.  CBI off since yesterday PM.  Filed Vitals:   09/18/13 1800 09/18/13 1841 09/18/13 2236 09/19/13 0500  BP: 146/60 130/69 149/67 150/65  Pulse: 81 84 87 93  Temp: 97.3 F (36.3 C) 97.4 F (36.3 C) 97.6 F (36.4 C) 98.1 F (36.7 C)  TempSrc: Oral Oral Oral Oral  Resp: 16 83 22 20  Height:      Weight:      SpO2:  100% 99% 98%   NAD Abdomen is soft Catheter draining clear urine on gentle CBI No CVA tenderness   Recent Labs  09/17/13 1500 09/18/13 0412 09/19/13 0500  WBC 14.3* 10.1 11.9*  HGB 7.7* 6.1* 7.3*  HCT 23.6* 18.0* 21.4*    Recent Labs  09/18/13 0412 09/18/13 1415 09/19/13 0500  NA 141 143 142  K 5.6* 5.7* 4.2  CL 103 101 100  CO2 17* 19 20  GLUCOSE 173* 166* 181*  BUN 90* 90* 79*  CREATININE 12.29* 12.60* 9.69*  CALCIUM 8.5 8.8 8.6    Recent Labs  09/18/13 1130  INR 1.27   No results found for this basename: LABURIN,  in the last 72 hours Results for orders placed during the hospital encounter of 09/17/13  URINE CULTURE     Status: None   Collection Time    09/17/13  4:44 PM      Result Value Range Status   Specimen Description URINE, CATHETERIZED   Final   Special Requests NONE   Final   Culture  Setup Time     Final   Value: 09/17/2013 21:43     Performed at [place]     Final   Value: NO GROWTH     Performed at [facility]   Culture     Final   Value: NO GROWTH     Performed at [facility]   Report Status 09/18/2013 FINAL   Final   Imp: Aggressive CaP resulting in gross hematuria requiring CBI Anemia secondary to acute blood loss and chronic disease Acute on chronic renal failure - likely component of pre-renal from reduced hemoglobin and obstructive from prostate DM/CAD/PVD   Recommendations: Cap CBI, will keep catheter in for 5-7 days to prevent any retention issues and to help tamponade any prostatic bleeding. Plan to leave the nephrostomy tubes open to gravity initially, will discuss placing antegrade stents as outpatient. Will defer medical care to Nephrology/IM, appreciate team effort in caring for this poor man. Further treatment for CaP likely palliative radiation at this point - Dr. Tammi Klippel will see the patient. Will continue to follow, while inpatient.  This weekend Dr. Gaynelle Arabian will be covering for me.  If the patient is discharged home, I will see him in the middle of next week for a voiding trial and discussion of internalizing neph tubes.

## 2013-09-19 NOTE — Telephone Encounter (Signed)
Called inpatient unit to speak with nurse.Spoke with Melissa and she will have Erasmo Downer to call me if needed if any questions regarding patient.I was called  to ct simulation room with concerns about some bleeding around patient's foley catheter.Patient has some penile edema and some bleeding around catheter insertion site.Cleaned up oozing and placed gauze around penis.Checked all openings to make sure all connections closed.

## 2013-09-19 NOTE — Consult Note (Signed)
Radiation Oncology         [phone] ________________________________  INPATIENT  Initial Radiation Oncology Consultation  Name: Samuel Garner MRN: [id number]  Date: 09/17/2013  DOB: [birth year]  [id number], ZACH, MD  No ref. provider found   REFERRING PHYSICIAN: No ref. provider found  DIAGNOSIS: 80 y.o. 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 gentleman presenting with 4-5 wk hx of gross hematuria.  On Aug 15, 2013 CT showed a large mass on posterior floor of bladder suspicious for large prostate median lobe/prostate ca. He was scheduled for consultation office visit w/ Dr. Louis Meckel on Sep 12, 2013 but developed gross hematuria w/ clots on Dec 18 and Foley catheter was inserted at [place].  The patient returned on Dec 19 w/ clot retention and was transferred to [place] where he underwent OR cysto clot evacuation, TURP of median lobe and excision/biopsy of bladder neck mass in addition to removal of a bladder calculus found intraoperatively. The patient hospital course was complicated by acute on chronic renal failure, and urinary retention. His creatinine rose to 4.24. In addition, the patient was unable to void following a voiding trial while in the hospital, a catheter was replaced and he was followed up here for a voiding trial 1 week later. He passed his voiding trial at that time and no foley catheter needed.   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 Due to comorbidities including chronic renal insufficiency, diabetes, peripheral vascular disease/coronary artery disease, which will likely limit the amount of chemotherapy that he is eligible to receive, he was referred to multidiscipline prostate cancer clinic where he can meet with both  oncology and radiation oncology.  Interval hx:   Samuel Garner pathology and radiology studies were presented in our GU tumor conference and he has been referred for radiation oncology consultation.  PREVIOUS RADIATION THERAPY: No  PAST MEDICAL HISTORY:  has a past medical history of Diabetes mellitus, type 2; Hypertension; Arteriosclerotic cardiovascular disease (ASCVD); Hyperlipidemia; Tobacco abuse, in remission; Gout; Chronic kidney disease; Type IV renal tubular acidosis; Myocardial infarction; and Small cell carcinoma of prostate.    PAST SURGICAL HISTORY: Past Surgical History  Procedure Laterality Date  . Hernia repair    . Coronary angioplasty  1993  . Knee arthroscopy    . Transurethral resection of prostate N/A 08/29/2013    Procedure: TRANSURETHRAL RESECTION OF THE PROSTATE (TURP) and Clot evacuation;  Surgeon: Ardis Hughs, MD;  Location: [facility];  Service: Urology;  Laterality: N/A;    FAMILY HISTORY: family history includes Diabetes in his mother; Heart attack in his father.  SOCIAL HISTORY:  reports that he quit smoking about 22 years ago. He has never used smokeless tobacco. He reports that he does not drink alcohol or use illicit drugs.  ALLERGIES: Lisinopril  MEDICATIONS:  Current Facility-Administered Medications  Medication Dose Route Frequency Provider Last Rate Last Dose  . 0.9 %  sodium chloride infusion   Intravenous Continuous Mirna Mires, MD 20 mL/hr at 09/17/13 1736    . acetaminophen (TYLENOL) tablet 650 mg  650 mg Oral Q6H PRN Donne Hazel, MD       Or  . acetaminophen (TYLENOL) suppository 650 mg  650 mg Rectal Q6H PRN Donne Hazel, MD      . calcitRIOL (ROCALTROL)  capsule 0.25 mcg  0.25 mcg Oral Q M,W,F Donne Hazel, MD   0.25 mcg at 09/17/13 2202  . cholecalciferol (VITAMIN D) tablet 1,000 Units  1,000 Units Oral q morning - 10a Donne Hazel, MD   1,000 Units at 09/18/13 0930  . fenofibrate tablet 54 mg  54 mg Oral Daily Donne Hazel,  MD   54 mg at 09/18/13 0930  . HYDROcodone-acetaminophen (NORCO/VICODIN) 5-325 MG per tablet 1-2 tablet  1-2 tablet Oral Q4H PRN Dayne Arne Cleveland III, MD   2 tablet at 09/19/13 0113  . insulin aspart (novoLOG) injection 0-15 Units  0-15 Units Subcutaneous TID WC Donne Hazel, MD   3 Units at 09/18/13 1716  . insulin aspart (novoLOG) injection 0-5 Units  0-5 Units Subcutaneous QHS Donne Hazel, MD      . metoprolol tartrate (LOPRESSOR) tablet 100 mg  100 mg Oral BID Donne Hazel, MD   100 mg at 09/18/13 2202  . morphine 2 MG/ML injection 2 mg  2 mg Intravenous Q4H PRN Donne Hazel, MD   2 mg at 09/17/13 1723  . niacin tablet 500 mg  500 mg Oral QHS Donne Hazel, MD   500 mg at 09/18/13 2202  . ondansetron (ZOFRAN) tablet 4 mg  4 mg Oral Q6H PRN Donne Hazel, MD       Or  . ondansetron [facility]) injection 4 mg  4 mg Intravenous Q6H PRN Donne Hazel, MD   4 mg at 09/17/13 1720  . simvastatin (ZOCOR) tablet 40 mg  40 mg Oral q1800 Donne Hazel, MD   40 mg at 09/18/13 1717  . sodium bicarbonate 150 mEq in sterile water 1,000 mL infusion   Intravenous Continuous Donne Hazel, MD 75 mL/hr at 09/18/13 1840      REVIEW OF SYSTEMS:  A 15 point review of systems is documented in the electronic medical record. This was obtained by the nursing staff. However, I reviewed this with the patient to discuss relevant findings and make appropriate changes.  Pertinent items are noted in HPI.Marland Kitchen     PHYSICAL EXAM: This patient is in no acute distress.  He is alert and oriented.   height is 5\' 9"  (1.753 m) and weight is 252 lb 6.8 oz (114.5 kg). His oral temperature is 98.1 F (36.7 C). His blood pressure is 150/65 and his pulse is 93. His respiration is 20 and oxygen saturation is 98%.  He exhibits no respiratory distress or labored breathing.  He appears neurologically intact.  His mood is pleasant.  His affect is appropriate.  Please note the digital rectal exam findings described above.  KPS =  30  100 - Normal; no complaints; no evidence of disease. 90   - Able to carry on normal activity; minor signs or symptoms of disease. 80   - Normal activity with effort; some signs or symptoms of disease. 74   - Cares for self; unable to carry on normal activity or to do active work. 60   - Requires occasional assistance, but is able to care for most of his personal needs. 50   - Requires considerable assistance and frequent medical care. 77   - Disabled; requires special care and assistance. 73   - Severely disabled; hospital admission is indicated although death not imminent. 65   - Very sick; hospital admission necessary; active supportive treatment necessary. 10   - Moribund; fatal processes progressing rapidly. 0     -  Dead  Karnofsky DA, Abelmann Biola, Craver LS and Wilkeson JH [phone]) The use of the nitrogen mustards in the palliative treatment of carcinoma: with particular reference to bronchogenic carcinoma Cancer 1 634-56   LABORATORY DATA:  Lab Results  Component Value Date   WBC 11.9* 09/19/2013   HGB 7.3* 09/19/2013   HCT 21.4* 09/19/2013   MCV 85.6 09/19/2013   PLT 253 09/19/2013   Lab Results  Component Value Date   NA 142 09/19/2013   K 4.2 09/19/2013   CL 100 09/19/2013   CO2 20 09/19/2013   Lab Results  Component Value Date   ALT 6 09/18/2013   AST 9 09/18/2013   ALKPHOS 46 09/18/2013   BILITOT 0.2* 09/18/2013     RADIOGRAPHY: Dg Chest 2 View  09/01/2013   CLINICAL DATA:  Two-day history of cough, history of previous smoking  EXAM: CHEST  2 VIEW  COMPARISON:  Chest x-ray of August 29, 2013.  FINDINGS: The lungs are adequately inflated. The interstitial markings are mildly increased though stable. The cardiopericardial silhouette is mildly enlarged. The pulmonary vascularity is not engorged. The mediastinum is normal in width. There is no pleural effusion. The observed portions of the bony thorax appear normal.  IMPRESSION: There is no evidence of pneumonia nor CHF. Chronically  increased interstitial markings likely reflect scarring though certainly one cannot exclude superimposed acute bronchitis with subsegmental atelectasis. Followup films are recommended if the patient's symptoms do not improve with anticipated therapy.   Electronically Signed   By: David  Martinique   On: 09/01/2013 13:27   Dg Chest 2 View  08/29/2013   CLINICAL DATA:  Preop  EXAM: CHEST  2 VIEW  COMPARISON:  None.  FINDINGS: Lungs are clear. No pleural effusion or pneumothorax.  Cardiomegaly.  Mild degenerative changes of the visualized thoracolumbar spine.  IMPRESSION: No evidence of acute cardiopulmonary disease.   Electronically Signed   By: Julian Hy M.D.   On: 08/29/2013 14:25   US Renal  09/17/2013   CLINICAL DATA:  Hematuria  EXAM: RENAL/URINARY TRACT ULTRASOUND COMPLETE  COMPARISON:  08/31/2013  FINDINGS: Right Kidney:  Length: 11.5 cm in length. Mild hydronephrosis is not significantly changed. No mass. Moderate cortical thinning.  Left Kidney:  Length: 13.0 cm in length. Mild hydronephrosis is not significantly changed. Moderate cortical thinning. No mass.  Bladder:  Foley catheter decompresses the bladder.  IMPRESSION: Stable mild bilateral hydronephrosis.   Electronically Signed   By: Maryclare Bean M.D.   On: 09/17/2013 20:06   US Renal  08/31/2013   CLINICAL DATA:  Elevated creatinine, hematuria, bladder mass  EXAM: RENAL/URINARY TRACT ULTRASOUND COMPLETE  COMPARISON:  CT abdomen pelvis dated 08/15/2013  FINDINGS: Right Kidney:  Length: 12.5 cm. Cortical thinning. Mild pelviectasis without frank hydronephrosis (image 7).  Left Kidney:  Length: 13.7 cm. Cortical thinning. 4.5 x 5.3 4.4 cm upper pole renal cyst. 2.2 x 2.6 cm lower pole renal cyst. Mild hydronephrosis.  Bladder:  Decompressed by indwelling Foley catheter.  IMPRESSION: Mild left hydronephrosis.  Mild right pelviectasis without frank hydronephrosis.  Bladder is decompressed by indwelling Foley catheter.   Electronically Signed   By:  Julian Hy M.D.   On: 08/31/2013 15:09   Ir Perc Nephrostomy Left  09/18/2013   CLINICAL DATA:  Prostate carcinoma, acute renal insufficiency, bilateral hydronephrosis.  EXAM: BILATERAL PERCUTANEOUS NEPHROSTOMY CATHETER PLACEMENT UNDER ULTRASOUND AND FLUOROSCOPIC GUIDANCE  TECHNIQUE: The procedure, risks (including but not limited to bleeding, infection, organ damage  ), benefits,  and alternatives were explained to the patient. Questions regarding the procedure were encouraged and answered. The patient understands and consents to the procedure.  BILATERALflank regionS prepped with Betadine, draped in usual sterile fashion, infiltrated locally with 1% lidocaine.As antibiotic prophylaxis, CEFAZOLIN 2 G was ordered pre-procedure and administered intravenously within one hour of incision.  Intravenous Fentanyl and Versed were administered as conscious sedation during continuous cardiorespiratory monitoring by the radiology RN, with a total moderate sedation time of 25 minutes.  Under real-time ultrasound guidance, a 21-gauge trocar needle was advanced into a posterior right renal calyx. Ultrasound image documentation was saved. Urine spontaneously returned through the needle. Needle was exchanged over a guidewire for transitional dilator. Contrast injection confirmed appropriate positioning. Catheter was exchanged over a guidewire for a 10 French pigtail catheter, formed centrally within the right renal collecting system. Contrast injection confirms appropriate positioning and patency.  In similar fashion, under real-time ultrasound guidance, a 21-gauge trocar needle was advanced into a posterior left renal calyx. Ultrasound image documentation was saved. Urine spontaneously returned through the needle. Needle was exchanged over a guidewire for transitional dilator. Contrast injection confirmed appropriate positioning. Catheter was exchanged over a guidewire for a 10 French pigtail catheter, formed centrally  within the left renal collecting system. Contrast injection confirms appropriate positioning and patency.  Catheters secured externally with 0 Prolene suture and StatLock and placed to external drain bags. No immediate complication.  FLUOROSCOPY TIME:  4 min 30 seconds  IMPRESSION: 1. Technically successful bilateral percutaneous nephrostomy catheter placement.   Electronically Signed   By: Arne Cleveland M.D.   On: 09/18/2013 15:12   Ir Perc Nephrostomy Right  09/18/2013   CLINICAL DATA:  Prostate carcinoma, acute renal insufficiency, bilateral hydronephrosis.  EXAM: BILATERAL PERCUTANEOUS NEPHROSTOMY CATHETER PLACEMENT UNDER ULTRASOUND AND FLUOROSCOPIC GUIDANCE  TECHNIQUE: The procedure, risks (including but not limited to bleeding, infection, organ damage  ), benefits, and alternatives were explained to the patient. Questions regarding the procedure were encouraged and answered. The patient understands and consents to the procedure.  BILATERALflank regionS prepped with Betadine, draped in usual sterile fashion, infiltrated locally with 1% lidocaine.As antibiotic prophylaxis, CEFAZOLIN 2 G was ordered pre-procedure and administered intravenously within one hour of incision.  Intravenous Fentanyl and Versed were administered as conscious sedation during continuous cardiorespiratory monitoring by the radiology RN, with a total moderate sedation time of 25 minutes.  Under real-time ultrasound guidance, a 21-gauge trocar needle was advanced into a posterior right renal calyx. Ultrasound image documentation was saved. Urine spontaneously returned through the needle. Needle was exchanged over a guidewire for transitional dilator. Contrast injection confirmed appropriate positioning. Catheter was exchanged over a guidewire for a 10 French pigtail catheter, formed centrally within the right renal collecting system. Contrast injection confirms appropriate positioning and patency.  In similar fashion, under real-time  ultrasound guidance, a 21-gauge trocar needle was advanced into a posterior left renal calyx. Ultrasound image documentation was saved. Urine spontaneously returned through the needle. Needle was exchanged over a guidewire for transitional dilator. Contrast injection confirmed appropriate positioning. Catheter was exchanged over a guidewire for a 10 French pigtail catheter, formed centrally within the left renal collecting system. Contrast injection confirms appropriate positioning and patency.  Catheters secured externally with 0 Prolene suture and StatLock and placed to external drain bags. No immediate complication.  FLUOROSCOPY TIME:  4 min 30 seconds  IMPRESSION: 1. Technically successful bilateral percutaneous nephrostomy catheter placement.   Electronically Signed   By: Delories Heinz.D.  On: 09/18/2013 15:12   Ir US Guide Bx Asp/drain  09/18/2013   CLINICAL DATA:  Prostate carcinoma, acute renal insufficiency, bilateral hydronephrosis.  EXAM: BILATERAL PERCUTANEOUS NEPHROSTOMY CATHETER PLACEMENT UNDER ULTRASOUND AND FLUOROSCOPIC GUIDANCE  TECHNIQUE: The procedure, risks (including but not limited to bleeding, infection, organ damage  ), benefits, and alternatives were explained to the patient. Questions regarding the procedure were encouraged and answered. The patient understands and consents to the procedure.  BILATERALflank regionS prepped with Betadine, draped in usual sterile fashion, infiltrated locally with 1% lidocaine.As antibiotic prophylaxis, CEFAZOLIN 2 G was ordered pre-procedure and administered intravenously within one hour of incision.  Intravenous Fentanyl and Versed were administered as conscious sedation during continuous cardiorespiratory monitoring by the radiology RN, with a total moderate sedation time of 25 minutes.  Under real-time ultrasound guidance, a 21-gauge trocar needle was advanced into a posterior right renal calyx. Ultrasound image documentation was saved. Urine  spontaneously returned through the needle. Needle was exchanged over a guidewire for transitional dilator. Contrast injection confirmed appropriate positioning. Catheter was exchanged over a guidewire for a 10 French pigtail catheter, formed centrally within the right renal collecting system. Contrast injection confirms appropriate positioning and patency.  In similar fashion, under real-time ultrasound guidance, a 21-gauge trocar needle was advanced into a posterior left renal calyx. Ultrasound image documentation was saved. Urine spontaneously returned through the needle. Needle was exchanged over a guidewire for transitional dilator. Contrast injection confirmed appropriate positioning. Catheter was exchanged over a guidewire for a 10 French pigtail catheter, formed centrally within the left renal collecting system. Contrast injection confirms appropriate positioning and patency.  Catheters secured externally with 0 Prolene suture and StatLock and placed to external drain bags. No immediate complication.  FLUOROSCOPY TIME:  4 min 30 seconds  IMPRESSION: 1. Technically successful bilateral percutaneous nephrostomy catheter placement.   Electronically Signed   By: Arne Cleveland M.D.   On: 09/18/2013 15:12   Ir US Guide Bx Asp/drain  09/18/2013   CLINICAL DATA:  Prostate carcinoma, acute renal insufficiency, bilateral hydronephrosis.  EXAM: BILATERAL PERCUTANEOUS NEPHROSTOMY CATHETER PLACEMENT UNDER ULTRASOUND AND FLUOROSCOPIC GUIDANCE  TECHNIQUE: The procedure, risks (including but not limited to bleeding, infection, organ damage  ), benefits, and alternatives were explained to the patient. Questions regarding the procedure were encouraged and answered. The patient understands and consents to the procedure.  BILATERALflank regionS prepped with Betadine, draped in usual sterile fashion, infiltrated locally with 1% lidocaine.As antibiotic prophylaxis, CEFAZOLIN 2 G was ordered pre-procedure and administered  intravenously within one hour of incision.  Intravenous Fentanyl and Versed were administered as conscious sedation during continuous cardiorespiratory monitoring by the radiology RN, with a total moderate sedation time of 25 minutes.  Under real-time ultrasound guidance, a 21-gauge trocar needle was advanced into a posterior right renal calyx. Ultrasound image documentation was saved. Urine spontaneously returned through the needle. Needle was exchanged over a guidewire for transitional dilator. Contrast injection confirmed appropriate positioning. Catheter was exchanged over a guidewire for a 10 French pigtail catheter, formed centrally within the right renal collecting system. Contrast injection confirms appropriate positioning and patency.  In similar fashion, under real-time ultrasound guidance, a 21-gauge trocar needle was advanced into a posterior left renal calyx. Ultrasound image documentation was saved. Urine spontaneously returned through the needle. Needle was exchanged over a guidewire for transitional dilator. Contrast injection confirmed appropriate positioning. Catheter was exchanged over a guidewire for a 10 French pigtail catheter, formed centrally within the left renal collecting system. Contrast injection confirms  appropriate positioning and patency.  Catheters secured externally with 0 Prolene suture and StatLock and placed to external drain bags. No immediate complication.  FLUOROSCOPY TIME:  4 min 30 seconds  IMPRESSION: 1. Technically successful bilateral percutaneous nephrostomy catheter placement.   Electronically Signed   By: Arne Cleveland M.D.   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  He is not a good surgical candidate, and would not be a good candidate for platinum based chemo.  He may benefit from localized palliative  radiotherapy.  PLAN:Today, I talked to the patient and family about the findings and work-up thus far.  We discussed the natural history of disease and general treatment, highlighting the role or radiotherapy in the management.  We discussed the available radiation techniques, and focused on the details of logistics and delivery.  We reviewed the anticipated acute and late sequelae associated with radiation in this setting.  The patient would like to proceed with radiation and will be scheduled for CT simulation.  I spent 40 minutes minutes face to face with the patient and more than 50% of that time was spent in counseling and/or coordination of care.    ------------------------------------------------  Sheral Apley. Tammi Klippel, M.D.

## 2013-09-19 NOTE — Progress Notes (Signed)
[facility] PROGRESS NOTE  Samuel Garner [id number] DOB: [birth year] DOA: 09/17/2013 PCP: Delphina Cahill, MD  Assessment/Plan: 1. Acute renal failure  1. Likely post-obstructive, concerns for upper tract obstruction 2. Cont with IVF as tolerated 3. Nephrology following 4. Cont to avoid nephrotoxic drugs (including ACEI from home med list) 5. Renal fx now improving with nephrostomy tubes 6. Appreciate Nephrology and Urology input 2. Hyperkalemia  1. S/p insulin and kayexalate in the ED 2. Resolved 3. DM  1. Cont with SSI as tolerated 4. HTN  1. Stable 2. Cont regimen 5. Hx gout  1. Hold allopurinol given ARF 6. Hematuria  1. S/p foley cath 2. Urology following 7. Anemia  1. Follow CBC 2. Likely related to hematuria 3. S/p transfusion this AM 8. Prostate cancer 1. Oncology notes reviewed. Overall poor prognosis 2. Not a candidate for surgery 3. Recommendations for outpatient follow up to discuss chemo vs hospice 9. DVT prophylaxis  1. SCD's for prophylaxis  Code Status: Full Family Communication: Pt in room (indicate person spoken with, relationship, and if by phone, the number) Disposition Plan: Pending  Consultants:  Nephrology  Urology  IR  Procedures:  CBI per Urology  Nephrostomy tubes per IR 09/18/13  HPI/Subjective: No acute events noted overnight. No complaints  Objective: Filed Vitals:   09/18/13 1800 09/18/13 1841 09/18/13 2236 09/19/13 0500  BP: 146/60 130/69 149/67 150/65  Pulse: 81 84 87 93  Temp: 97.3 F (36.3 C) 97.4 F (36.3 C) 97.6 F (36.4 C) 98.1 F (36.7 C)  TempSrc: Oral Oral Oral Oral  Resp: 16 83 22 20  Height:      Weight:      SpO2:  100% 99% 98%    Intake/Output Summary (Last 24 hours) at 09/19/13 1231 Last data filed at 09/19/13 0945  Gross per 24 hour  Intake 3879.5 ml  Output   4225 ml  Net -345.5 ml   Filed Weights   09/17/13 2026  Weight: 114.5 kg (252 lb 6.8 oz)    Exam:   General:  Awake, in  nad  Cardiovascular: regular, s1, s2  Respiratory: normal resp effort, no wheezing  Abdomen: soft, nondistended  Musculoskeletal: perfused, no clubbing   Data Reviewed: Basic Metabolic Panel:  Recent Labs Lab 09/17/13 1500 09/17/13 2045 09/18/13 0412 09/18/13 1415 09/19/13 0500  NA 134* 138 141 143 142  K 6.4* 5.8* 5.6* 5.7* 4.2  CL 96 97 103 101 100  CO2 12* 15* 17* 19 20  GLUCOSE 188* 248* 173* 166* 181*  BUN 88* 88* 90* 90* 79*  CREATININE 11.39* 11.73* 12.29* 12.60* 9.69*  CALCIUM 9.2 9.0 8.5 8.8 8.6  PHOS  --  7.5*  --  7.8* 6.2*   Liver Function Tests:  Recent Labs Lab 09/17/13 1500 09/17/13 2045 09/18/13 0412 09/18/13 1415 09/19/13 0500  AST 10  --  9  --   --   ALT 7  --  6  --   --   ALKPHOS 58  --  46  --   --   BILITOT 0.3  --  0.2*  --   --   PROT 6.8  --  5.6*  --   --   ALBUMIN 2.7* 2.5* 2.3* 2.3* 2.3*   No results found for this basename: LIPASE, AMYLASE,  in the last 168 hours No results found for this basename: AMMONIA,  in the last 168 hours CBC:  Recent Labs Lab 09/17/13 1500 09/18/13 0412 09/19/13 0500  WBC 14.3*  10.1 11.9*  HGB 7.7* 6.1* 7.3*  HCT 23.6* 18.0* 21.4*  MCV 87.1 86.1 85.6  PLT 327 255 253   Cardiac Enzymes: No results found for this basename: CKTOTAL, CKMB, CKMBINDEX, TROPONINI,  in the last 168 hours BNP (last 3 results)  Recent Labs  09/01/13 0513  PROBNP 2536.0*   CBG:  Recent Labs Lab 09/18/13 0719 09/18/13 1157 09/18/13 1649 09/18/13 2205 09/19/13 0802  GLUCAP 153* 135* 160* 175* 160*    Recent Results (from the past 240 hour(s))  URINE CULTURE     Status: None   Collection Time    09/17/13  4:44 PM      Result Value Range Status   Specimen Description URINE, CATHETERIZED   Final   Special Requests NONE   Final   Culture  Setup Time     Final   Value: 09/17/2013 21:43     Performed at [facility]     Final   Value: NO GROWTH     Performed at [facility]    Culture     Final   Value: NO GROWTH     Performed at [facility]   Report Status 09/18/2013 FINAL   Final     Studies: US Renal  09/17/2013   CLINICAL DATA:  Hematuria  EXAM: RENAL/URINARY TRACT ULTRASOUND COMPLETE  COMPARISON:  08/31/2013  FINDINGS: Right Kidney:  Length: 11.5 cm in length. Mild hydronephrosis is not significantly changed. No mass. Moderate cortical thinning.  Left Kidney:  Length: 13.0 cm in length. Mild hydronephrosis is not significantly changed. Moderate cortical thinning. No mass.  Bladder:  Foley catheter decompresses the bladder.  IMPRESSION: Stable mild bilateral hydronephrosis.   Electronically Signed   By: Maryclare Bean M.D.   On: 09/17/2013 20:06   Ir Perc Nephrostomy Left  09/18/2013   CLINICAL DATA:  Prostate carcinoma, acute renal insufficiency, bilateral hydronephrosis.  EXAM: BILATERAL PERCUTANEOUS NEPHROSTOMY CATHETER PLACEMENT UNDER ULTRASOUND AND FLUOROSCOPIC GUIDANCE  TECHNIQUE: The procedure, risks (including but not limited to bleeding, infection, organ damage  ), benefits, and alternatives were explained to the patient. Questions regarding the procedure were encouraged and answered. The patient understands and consents to the procedure.  BILATERALflank regionS prepped with Betadine, draped in usual sterile fashion, infiltrated locally with 1% lidocaine.As antibiotic prophylaxis, CEFAZOLIN 2 G was ordered pre-procedure and administered intravenously within one hour of incision.  Intravenous Fentanyl and Versed were administered as conscious sedation during continuous cardiorespiratory monitoring by the radiology RN, with a total moderate sedation time of 25 minutes.  Under real-time ultrasound guidance, a 21-gauge trocar needle was advanced into a posterior right renal calyx. Ultrasound image documentation was saved. Urine spontaneously returned through the needle. Needle was exchanged over a guidewire for transitional dilator. Contrast injection confirmed  appropriate positioning. Catheter was exchanged over a guidewire for a 10 French pigtail catheter, formed centrally within the right renal collecting system. Contrast injection confirms appropriate positioning and patency.  In similar fashion, under real-time ultrasound guidance, a 21-gauge trocar needle was advanced into a posterior left renal calyx. Ultrasound image documentation was saved. Urine spontaneously returned through the needle. Needle was exchanged over a guidewire for transitional dilator. Contrast injection confirmed appropriate positioning. Catheter was exchanged over a guidewire for a 10 French pigtail catheter, formed centrally within the left renal collecting system. Contrast injection confirms appropriate positioning and patency.  Catheters secured externally with 0 Prolene suture and StatLock and placed to external drain bags.  No immediate complication.  FLUOROSCOPY TIME:  4 min 30 seconds  IMPRESSION: 1. Technically successful bilateral percutaneous nephrostomy catheter placement.   Electronically Signed   By: Arne Cleveland M.D.   On: 09/18/2013 15:12   Ir Perc Nephrostomy Right  09/18/2013   CLINICAL DATA:  Prostate carcinoma, acute renal insufficiency, bilateral hydronephrosis.  EXAM: BILATERAL PERCUTANEOUS NEPHROSTOMY CATHETER PLACEMENT UNDER ULTRASOUND AND FLUOROSCOPIC GUIDANCE  TECHNIQUE: The procedure, risks (including but not limited to bleeding, infection, organ damage  ), benefits, and alternatives were explained to the patient. Questions regarding the procedure were encouraged and answered. The patient understands and consents to the procedure.  BILATERALflank regionS prepped with Betadine, draped in usual sterile fashion, infiltrated locally with 1% lidocaine.As antibiotic prophylaxis, CEFAZOLIN 2 G was ordered pre-procedure and administered intravenously within one hour of incision.  Intravenous Fentanyl and Versed were administered as conscious sedation during continuous  cardiorespiratory monitoring by the radiology RN, with a total moderate sedation time of 25 minutes.  Under real-time ultrasound guidance, a 21-gauge trocar needle was advanced into a posterior right renal calyx. Ultrasound image documentation was saved. Urine spontaneously returned through the needle. Needle was exchanged over a guidewire for transitional dilator. Contrast injection confirmed appropriate positioning. Catheter was exchanged over a guidewire for a 10 French pigtail catheter, formed centrally within the right renal collecting system. Contrast injection confirms appropriate positioning and patency.  In similar fashion, under real-time ultrasound guidance, a 21-gauge trocar needle was advanced into a posterior left renal calyx. Ultrasound image documentation was saved. Urine spontaneously returned through the needle. Needle was exchanged over a guidewire for transitional dilator. Contrast injection confirmed appropriate positioning. Catheter was exchanged over a guidewire for a 10 French pigtail catheter, formed centrally within the left renal collecting system. Contrast injection confirms appropriate positioning and patency.  Catheters secured externally with 0 Prolene suture and StatLock and placed to external drain bags. No immediate complication.  FLUOROSCOPY TIME:  4 min 30 seconds  IMPRESSION: 1. Technically successful bilateral percutaneous nephrostomy catheter placement.   Electronically Signed   By: Arne Cleveland M.D.   On: 09/18/2013 15:12   Ir US Guide Bx Asp/drain  09/18/2013   CLINICAL DATA:  Prostate carcinoma, acute renal insufficiency, bilateral hydronephrosis.  EXAM: BILATERAL PERCUTANEOUS NEPHROSTOMY CATHETER PLACEMENT UNDER ULTRASOUND AND FLUOROSCOPIC GUIDANCE  TECHNIQUE: The procedure, risks (including but not limited to bleeding, infection, organ damage  ), benefits, and alternatives were explained to the patient. Questions regarding the procedure were encouraged and answered.  The patient understands and consents to the procedure.  BILATERALflank regionS prepped with Betadine, draped in usual sterile fashion, infiltrated locally with 1% lidocaine.As antibiotic prophylaxis, CEFAZOLIN 2 G was ordered pre-procedure and administered intravenously within one hour of incision.  Intravenous Fentanyl and Versed were administered as conscious sedation during continuous cardiorespiratory monitoring by the radiology RN, with a total moderate sedation time of 25 minutes.  Under real-time ultrasound guidance, a 21-gauge trocar needle was advanced into a posterior right renal calyx. Ultrasound image documentation was saved. Urine spontaneously returned through the needle. Needle was exchanged over a guidewire for transitional dilator. Contrast injection confirmed appropriate positioning. Catheter was exchanged over a guidewire for a 10 French pigtail catheter, formed centrally within the right renal collecting system. Contrast injection confirms appropriate positioning and patency.  In similar fashion, under real-time ultrasound guidance, a 21-gauge trocar needle was advanced into a posterior left renal calyx. Ultrasound image documentation was saved. Urine spontaneously returned through the needle. Needle was exchanged over  a guidewire for transitional dilator. Contrast injection confirmed appropriate positioning. Catheter was exchanged over a guidewire for a 10 French pigtail catheter, formed centrally within the left renal collecting system. Contrast injection confirms appropriate positioning and patency.  Catheters secured externally with 0 Prolene suture and StatLock and placed to external drain bags. No immediate complication.  FLUOROSCOPY TIME:  4 min 30 seconds  IMPRESSION: 1. Technically successful bilateral percutaneous nephrostomy catheter placement.   Electronically Signed   By: Arne Cleveland M.D.   On: 09/18/2013 15:12   Ir US Guide Bx Asp/drain  09/18/2013   CLINICAL DATA:  Prostate  carcinoma, acute renal insufficiency, bilateral hydronephrosis.  EXAM: BILATERAL PERCUTANEOUS NEPHROSTOMY CATHETER PLACEMENT UNDER ULTRASOUND AND FLUOROSCOPIC GUIDANCE  TECHNIQUE: The procedure, risks (including but not limited to bleeding, infection, organ damage  ), benefits, and alternatives were explained to the patient. Questions regarding the procedure were encouraged and answered. The patient understands and consents to the procedure.  BILATERALflank regionS prepped with Betadine, draped in usual sterile fashion, infiltrated locally with 1% lidocaine.As antibiotic prophylaxis, CEFAZOLIN 2 G was ordered pre-procedure and administered intravenously within one hour of incision.  Intravenous Fentanyl and Versed were administered as conscious sedation during continuous cardiorespiratory monitoring by the radiology RN, with a total moderate sedation time of 25 minutes.  Under real-time ultrasound guidance, a 21-gauge trocar needle was advanced into a posterior right renal calyx. Ultrasound image documentation was saved. Urine spontaneously returned through the needle. Needle was exchanged over a guidewire for transitional dilator. Contrast injection confirmed appropriate positioning. Catheter was exchanged over a guidewire for a 10 French pigtail catheter, formed centrally within the right renal collecting system. Contrast injection confirms appropriate positioning and patency.  In similar fashion, under real-time ultrasound guidance, a 21-gauge trocar needle was advanced into a posterior left renal calyx. Ultrasound image documentation was saved. Urine spontaneously returned through the needle. Needle was exchanged over a guidewire for transitional dilator. Contrast injection confirmed appropriate positioning. Catheter was exchanged over a guidewire for a 10 French pigtail catheter, formed centrally within the left renal collecting system. Contrast injection confirms appropriate positioning and patency.  Catheters  secured externally with 0 Prolene suture and StatLock and placed to external drain bags. No immediate complication.  FLUOROSCOPY TIME:  4 min 30 seconds  IMPRESSION: 1. Technically successful bilateral percutaneous nephrostomy catheter placement.   Electronically Signed   By: Arne Cleveland M.D.   On: 09/18/2013 15:12    Scheduled Meds: . calcitRIOL  0.25 mcg Oral Q M,W,F  . cholecalciferol  1,000 Units Oral q morning - 10a  . feeding supplement (NEPRO CARB STEADY)  237 mL Oral Q24H  . fenofibrate  54 mg Oral Daily  . insulin aspart  0-15 Units Subcutaneous TID WC  . insulin aspart  0-5 Units Subcutaneous QHS  . metoprolol  100 mg Oral BID  . niacin  500 mg Oral QHS  . simvastatin  40 mg Oral q1800   Continuous Infusions: . sodium chloride 20 mL/hr at 09/17/13 1736  .  sodium bicarbonate 150 mEq in sterile water 1000 mL infusion 75 mL/hr at 09/19/13 0844    Principal Problem:   Obstructive uropathy Active Problems:   Diabetes mellitus, type 2   Hypertension   Chronic kidney disease   Gross hematuria   ARF (acute renal failure)  Time spent: 26min  [facility] Pager [phone]. If 7PM-7AM, please contact night-coverage at [email or web address], password [facility] 09/19/2013, 12:31 PM  LOS: 2 days

## 2013-09-19 NOTE — Evaluation (Signed)
Physical Therapy Evaluation Patient Details Name: Samuel Garner MRN: [id number] DOB: [birth year] Today's Date: 09/19/2013 Time: 1021-1039 PT Time Calculation (min): 18 min  PT Assessment / Plan / Recommendation History of Present Illness  77M Admitted on 09/17/13 for gross hematuria, acute on chronic renal failure, anemia, and hyperkalemia.  Patient recently diagnosed with aggressive prostate cancer and is awaiting any recommendations of treatment from multi-disciplinary prostate cancer clinic, held Friday 09/19/13.  Clinical Impression  Pt admitted with above.  Pt currently with functional limitations due to the deficits listed below (see PT Problem List).  Pt will benefit from skilled PT to increase their independence and safety with mobility to allow discharge to the venue listed below.  Pt with family in room and they appear very involved in care and supportive.  Recommend pt ambulate with staff or family while in hospital to maintain/increase mobility.     PT Assessment  Patient needs continued PT services    Follow Up Recommendations  No PT follow up    Does the patient have the potential to tolerate intense rehabilitation      Barriers to Discharge        Equipment Recommendations  None recommended by PT    Recommendations for Other Services     Frequency Min 3X/week    Precautions / Restrictions Precautions Precaution Comments: bil nephrostomy tubes, usually leaks around foley site (used washcloth taped around foley tube for mobility) Restrictions Weight Bearing Restrictions: No   Pertinent Vitals/Pain No c/o pain      Mobility  Bed Mobility Overal bed mobility: Needs Assistance Bed Mobility: Supine to Sit;Sit to Supine Supine to sit: Min assist Sit to supine: Min assist General bed mobility comments: assist for lines/tubes and a little assist for trunk upright and then LEs onto bed Transfers Overall transfer level: Needs assistance Equipment used: Rolling walker  (2 wheeled) Transfers: Sit to/from Stand Sit to Stand: Min assist General transfer comment: assist to stand, verbal cues for safety Ambulation/Gait Ambulation/Gait assistance: Min guard Ambulation Distance (Feet): 400 Feet Assistive device: Rolling walker (2 wheeled) Gait Pattern/deviations: Step-through pattern;Decreased stride length;Trunk flexed Gait velocity: decr    Exercises     PT Diagnosis: Difficulty walking  PT Problem List: Decreased mobility;Decreased activity tolerance;Decreased knowledge of use of DME PT Treatment Interventions: DME instruction;Gait training;Functional mobility training;Therapeutic activities;Therapeutic exercise;Patient/family education;Stair training     PT Goals(Current goals can be found in the care plan section) Acute Rehab PT Goals PT Goal Formulation: With patient Time For Goal Achievement: 10/03/13 Potential to Achieve Goals: Good  Visit Information  Last PT Received On: 09/19/13 Assistance Needed: +1 History of Present Illness: 77M Admitted on 09/17/13 for gross hematuria, acute on chronic renal failure, anemia, and hyperkalemia.  Patient recently diagnosed with aggressive prostate cancer and is awaiting any recommendations of treatment from multi-disciplinary prostate cancer clinic, held Friday 09/19/13.       Prior [place] expects to be discharged to:: Private residence Living Arrangements: Spouse/significant other Available Help at Discharge: Family Type of Home: House Home Access: Stairs to enter [employer] of Steps: 2-3 Home Layout: One [place]: Environmental consultant - 2 wheels;Shower seat Prior Function Level of Independence: Independent Communication Communication: No difficulties    Cognition  Cognition Arousal/Alertness: Awake/alert Behavior During Therapy: WFL for tasks assessed/performed Overall Cognitive Status: Within Functional Limits for tasks assessed    Extremity/Trunk  Assessment Lower Extremity Assessment Lower Extremity Assessment: Overall WFL for tasks assessed   Balance  End of Session PT - End of Session Activity Tolerance: Patient tolerated treatment well Patient left: in bed;with call bell/phone within reach;with family/visitor present Nurse Communication: Mobility status  GP     [place] Firkus,KATHrine E 09/19/2013, 10:54 AM Carmelia Bake, PT, DPT 09/19/2013 Pager: [phone]

## 2013-09-19 NOTE — Progress Notes (Signed)
Subjective: Pt sitting up in chair. Feels pretty good. Had some pain at PCN sites last pm, but it's essentially gone this am. Thinks he needs to have a BM.  Objective: Physical Exam: BP 150/65  Pulse 93  Temp(Src) 98.1 F (36.7 C) (Oral)  Resp 20  Ht 5\' 9"  (1.753 m)  Wt 252 lb 6.8 oz (114.5 kg)  BMI 37.26 kg/m2  SpO2 98% (L)PCN intact, site clean. Hematuria with few strandy clots.  Flushed with 10cc NS, return of thin blood tinged urine. 1025cc recorded yesterday, another 225cc this am. (R)PCN intact, site clean, dry. Output is more gross hematuria with clots. Flushed easily with 10cc NS. Return of gross hematuria. 950cc recorded yesterday, another 200cc in bag now.    Labs: CBC  Recent Labs  09/18/13 0412 09/19/13 0500  WBC 10.1 11.9*  HGB 6.1* 7.3*  HCT 18.0* 21.4*  PLT 255 253   BMET  Recent Labs  09/18/13 1415 09/19/13 0500  NA 143 142  K 5.7* 4.2  CL 101 100  CO2 19 20  GLUCOSE 166* 181*  BUN 90* 79*  CREATININE 12.60* 9.69*  CALCIUM 8.8 8.6   LFT  Recent Labs  09/18/13 0412  09/19/13 0500  PROT 5.6*  --   --   ALBUMIN 2.3*  < > 2.3*  AST 9  --   --   ALT 6  --   --   ALKPHOS 46  --   --   BILITOT 0.2*  --   --   < > = values in this interval not displayed. PT/INR  Recent Labs  09/18/13 1130  LABPROT 15.6*  INR 1.27     Studies/Results: US Renal  09/17/2013   CLINICAL DATA:  Hematuria  EXAM: RENAL/URINARY TRACT ULTRASOUND COMPLETE  COMPARISON:  08/31/2013  FINDINGS: Right Kidney:  Length: 11.5 cm in length. Mild hydronephrosis is not significantly changed. No mass. Moderate cortical thinning.  Left Kidney:  Length: 13.0 cm in length. Mild hydronephrosis is not significantly changed. Moderate cortical thinning. No mass.  Bladder:  Foley catheter decompresses the bladder.  IMPRESSION: Stable mild bilateral hydronephrosis.   Electronically Signed   By: Maryclare Bean M.D.   On: 09/17/2013 20:06   Ir Perc Nephrostomy Left  09/18/2013   CLINICAL  DATA:  Prostate carcinoma, acute renal insufficiency, bilateral hydronephrosis.  EXAM: BILATERAL PERCUTANEOUS NEPHROSTOMY CATHETER PLACEMENT UNDER ULTRASOUND AND FLUOROSCOPIC GUIDANCE  TECHNIQUE: The procedure, risks (including but not limited to bleeding, infection, organ damage  ), benefits, and alternatives were explained to the patient. Questions regarding the procedure were encouraged and answered. The patient understands and consents to the procedure.  BILATERALflank regionS prepped with Betadine, draped in usual sterile fashion, infiltrated locally with 1% lidocaine.As antibiotic prophylaxis, CEFAZOLIN 2 G was ordered pre-procedure and administered intravenously within one hour of incision.  Intravenous Fentanyl and Versed were administered as conscious sedation during continuous cardiorespiratory monitoring by the radiology RN, with a total moderate sedation time of 25 minutes.  Under real-time ultrasound guidance, a 21-gauge trocar needle was advanced into a posterior right renal calyx. Ultrasound image documentation was saved. Urine spontaneously returned through the needle. Needle was exchanged over a guidewire for transitional dilator. Contrast injection confirmed appropriate positioning. Catheter was exchanged over a guidewire for a 10 French pigtail catheter, formed centrally within the right renal collecting system. Contrast injection confirms appropriate positioning and patency.  In similar fashion, under real-time ultrasound guidance, a 21-gauge trocar needle was advanced into a posterior left  renal calyx. Ultrasound image documentation was saved. Urine spontaneously returned through the needle. Needle was exchanged over a guidewire for transitional dilator. Contrast injection confirmed appropriate positioning. Catheter was exchanged over a guidewire for a 10 French pigtail catheter, formed centrally within the left renal collecting system. Contrast injection confirms appropriate positioning and  patency.  Catheters secured externally with 0 Prolene suture and StatLock and placed to external drain bags. No immediate complication.  FLUOROSCOPY TIME:  4 min 30 seconds  IMPRESSION: 1. Technically successful bilateral percutaneous nephrostomy catheter placement.   Electronically Signed   By: Arne Cleveland M.D.   On: 09/18/2013 15:12   Ir Perc Nephrostomy Right  09/18/2013   CLINICAL DATA:  Prostate carcinoma, acute renal insufficiency, bilateral hydronephrosis.  EXAM: BILATERAL PERCUTANEOUS NEPHROSTOMY CATHETER PLACEMENT UNDER ULTRASOUND AND FLUOROSCOPIC GUIDANCE  TECHNIQUE: The procedure, risks (including but not limited to bleeding, infection, organ damage  ), benefits, and alternatives were explained to the patient. Questions regarding the procedure were encouraged and answered. The patient understands and consents to the procedure.  BILATERALflank regionS prepped with Betadine, draped in usual sterile fashion, infiltrated locally with 1% lidocaine.As antibiotic prophylaxis, CEFAZOLIN 2 G was ordered pre-procedure and administered intravenously within one hour of incision.  Intravenous Fentanyl and Versed were administered as conscious sedation during continuous cardiorespiratory monitoring by the radiology RN, with a total moderate sedation time of 25 minutes.  Under real-time ultrasound guidance, a 21-gauge trocar needle was advanced into a posterior right renal calyx. Ultrasound image documentation was saved. Urine spontaneously returned through the needle. Needle was exchanged over a guidewire for transitional dilator. Contrast injection confirmed appropriate positioning. Catheter was exchanged over a guidewire for a 10 French pigtail catheter, formed centrally within the right renal collecting system. Contrast injection confirms appropriate positioning and patency.  In similar fashion, under real-time ultrasound guidance, a 21-gauge trocar needle was advanced into a posterior left renal calyx.  Ultrasound image documentation was saved. Urine spontaneously returned through the needle. Needle was exchanged over a guidewire for transitional dilator. Contrast injection confirmed appropriate positioning. Catheter was exchanged over a guidewire for a 10 French pigtail catheter, formed centrally within the left renal collecting system. Contrast injection confirms appropriate positioning and patency.  Catheters secured externally with 0 Prolene suture and StatLock and placed to external drain bags. No immediate complication.  FLUOROSCOPY TIME:  4 min 30 seconds  IMPRESSION: 1. Technically successful bilateral percutaneous nephrostomy catheter placement.   Electronically Signed   By: Arne Cleveland M.D.   On: 09/18/2013 15:12   Ir US Guide Bx Asp/drain  09/18/2013   CLINICAL DATA:  Prostate carcinoma, acute renal insufficiency, bilateral hydronephrosis.  EXAM: BILATERAL PERCUTANEOUS NEPHROSTOMY CATHETER PLACEMENT UNDER ULTRASOUND AND FLUOROSCOPIC GUIDANCE  TECHNIQUE: The procedure, risks (including but not limited to bleeding, infection, organ damage  ), benefits, and alternatives were explained to the patient. Questions regarding the procedure were encouraged and answered. The patient understands and consents to the procedure.  BILATERALflank regionS prepped with Betadine, draped in usual sterile fashion, infiltrated locally with 1% lidocaine.As antibiotic prophylaxis, CEFAZOLIN 2 G was ordered pre-procedure and administered intravenously within one hour of incision.  Intravenous Fentanyl and Versed were administered as conscious sedation during continuous cardiorespiratory monitoring by the radiology RN, with a total moderate sedation time of 25 minutes.  Under real-time ultrasound guidance, a 21-gauge trocar needle was advanced into a posterior right renal calyx. Ultrasound image documentation was saved. Urine spontaneously returned through the needle. Needle was exchanged over a guidewire  for transitional  dilator. Contrast injection confirmed appropriate positioning. Catheter was exchanged over a guidewire for a 10 French pigtail catheter, formed centrally within the right renal collecting system. Contrast injection confirms appropriate positioning and patency.  In similar fashion, under real-time ultrasound guidance, a 21-gauge trocar needle was advanced into a posterior left renal calyx. Ultrasound image documentation was saved. Urine spontaneously returned through the needle. Needle was exchanged over a guidewire for transitional dilator. Contrast injection confirmed appropriate positioning. Catheter was exchanged over a guidewire for a 10 French pigtail catheter, formed centrally within the left renal collecting system. Contrast injection confirms appropriate positioning and patency.  Catheters secured externally with 0 Prolene suture and StatLock and placed to external drain bags. No immediate complication.  FLUOROSCOPY TIME:  4 min 30 seconds  IMPRESSION: 1. Technically successful bilateral percutaneous nephrostomy catheter placement.   Electronically Signed   By: Arne Cleveland M.D.   On: 09/18/2013 15:12   Ir US Guide Bx Asp/drain  09/18/2013   CLINICAL DATA:  Prostate carcinoma, acute renal insufficiency, bilateral hydronephrosis.  EXAM: BILATERAL PERCUTANEOUS NEPHROSTOMY CATHETER PLACEMENT UNDER ULTRASOUND AND FLUOROSCOPIC GUIDANCE  TECHNIQUE: The procedure, risks (including but not limited to bleeding, infection, organ damage  ), benefits, and alternatives were explained to the patient. Questions regarding the procedure were encouraged and answered. The patient understands and consents to the procedure.  BILATERALflank regionS prepped with Betadine, draped in usual sterile fashion, infiltrated locally with 1% lidocaine.As antibiotic prophylaxis, CEFAZOLIN 2 G was ordered pre-procedure and administered intravenously within one hour of incision.  Intravenous Fentanyl and Versed were administered as  conscious sedation during continuous cardiorespiratory monitoring by the radiology RN, with a total moderate sedation time of 25 minutes.  Under real-time ultrasound guidance, a 21-gauge trocar needle was advanced into a posterior right renal calyx. Ultrasound image documentation was saved. Urine spontaneously returned through the needle. Needle was exchanged over a guidewire for transitional dilator. Contrast injection confirmed appropriate positioning. Catheter was exchanged over a guidewire for a 10 French pigtail catheter, formed centrally within the right renal collecting system. Contrast injection confirms appropriate positioning and patency.  In similar fashion, under real-time ultrasound guidance, a 21-gauge trocar needle was advanced into a posterior left renal calyx. Ultrasound image documentation was saved. Urine spontaneously returned through the needle. Needle was exchanged over a guidewire for transitional dilator. Contrast injection confirmed appropriate positioning. Catheter was exchanged over a guidewire for a 10 French pigtail catheter, formed centrally within the left renal collecting system. Contrast injection confirms appropriate positioning and patency.  Catheters secured externally with 0 Prolene suture and StatLock and placed to external drain bags. No immediate complication.  FLUOROSCOPY TIME:  4 min 30 seconds  IMPRESSION: 1. Technically successful bilateral percutaneous nephrostomy catheter placement.   Electronically Signed   By: Arne Cleveland M.D.   On: 09/18/2013 15:12    Assessment/Plan: (B)hydronephrosis from obstructing prostate cancer Acute on chronic renal failure. S/p (B)PCN placement. Good UOP from both catheters, though +hematuria R>L Cr down to 9.69-holding on HD for now. D/w RN importance of flushing PCNs frequently to ensure proper function. Hematuria related to PCNs usually ceases in first 24-48hrs. IR will follow closely.     LOS: 2 days    Ascencion Dike  PA-C 09/19/2013 9:00 AM

## 2013-09-19 NOTE — Consult Note (Signed)
Reason for Referral: Prostate cancer.   HPI: This is 80 year old man gentleman native of [place]. He was in his usual state of health till he presented with 4-5 wk history of gross hematuria. On Aug 15, 2013 CT showed a large mass on posterior floor of bladder suspicious for large prostate median lobe/prostate ca. He was scheduled for consultation office visit w/ Dr. Louis Meckel on Sep 12, 2013 but developed gross hematuria w/ clots on Dec 18 and Foley catheter was inserted at [place]. The patient returned on Dec 19 with clot retention and was transferred to [place] where he underwent operating room cystoscopy and clot evacuation, TURP of median lobe and excision/biopsy of bladder neck mass in addition to removal of a bladder calculus found intraoperatively. The patient hospital course was complicated by acute on chronic renal failure, and urinary retention. His creatinine rose to 4.24. In addition, the patient was unable to void following a voiding trial while in the hospital, a catheter was replaced and he was followed up here for a voiding trial 1 week later. He passed his voiding trial at that time and no foley catheter needed.  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 He had blood work done at [facility] office in the last few days which showed creatinine 9.83, BUN 78, and K 6.4. He was admitted again for percutaneous nephrostomy tube which were placed urgently on 09/18/2013. He was supposed to be evaluated at the prostate cancer multidisciplinary clinic but he was admitted as mentioned and am seeing him in consultation.   Clinically, he is feeling slightly better still has a Foley catheter in place and reporting some abdominal and pelvic discomfort. He was able to ambulate with the help of physical therapy and walker down the hall and back. He still having some  hematuria from his percutaneous tubes as well as his from Foley catheter. Is not reporting any other complaints at the time being. His family present today including his wife 2 daughters and grandson.    Past Medical History  Diagnosis Date  . Diabetes mellitus, type 2     with microalbuminuria  . Hypertension   . Arteriosclerotic cardiovascular disease (ASCVD)      2 vessel percutaneous transluminal coronary angioplasty in 3/93  . Hyperlipidemia     low HDL, high triglycerides  . Tobacco abuse, in remission     discontinued in 1993  . Gout   . Chronic kidney disease     creatinine 1.8 in 2002, 1.6 1n 2007 and 1.0 and 2008; deterioration in renal function; Secondary hyperparathyroidism  . Type IV renal tubular acidosis     With hyperkalemia  . Myocardial infarction     1993  . Small cell carcinoma of prostate   :  Past Surgical History  Procedure Laterality Date  . Hernia repair    . Coronary angioplasty  1993  . Knee arthroscopy    . Transurethral resection of prostate N/A 08/29/2013    Procedure: TRANSURETHRAL RESECTION OF THE PROSTATE (TURP) and Clot evacuation;  Surgeon: Ardis Hughs, MD;  Location: [facility];  Service: Urology;  Laterality: N/A;  :  Current Facility-Administered Medications  Medication Dose Route Frequency Provider Last Rate Last Dose  . 0.9 %  sodium chloride infusion   Intravenous Continuous Mirna Mires, MD 20 mL/hr at 09/17/13 1736    . acetaminophen (TYLENOL) tablet 650 mg  650 mg Oral Q6H PRN Donne Hazel, MD       Or  . acetaminophen (TYLENOL) suppository 650 mg  650 mg Rectal Q6H PRN Donne Hazel, MD      . calcitRIOL (ROCALTROL) capsule 0.25 mcg  0.25 mcg Oral Q M,W,F Donne Hazel, MD   0.25 mcg at 09/19/13 1012  . cholecalciferol (VITAMIN D) tablet 1,000 Units  1,000 Units Oral q morning - 10a Donne Hazel, MD   1,000 Units at 09/19/13 1012  . feeding supplement (NEPRO CARB STEADY) liquid 237 mL  237 mL Oral Q24H Hazle Coca, RD      . fenofibrate tablet 54 mg  54 mg Oral Daily Donne Hazel, MD   54 mg at 09/19/13 1012  . HYDROcodone-acetaminophen (NORCO/VICODIN) 5-325 MG per tablet 1-2 tablet  1-2 tablet Oral Q4H PRN Dayne Arne Cleveland III, MD   2 tablet at 09/19/13 0113  . insulin aspart (novoLOG) injection 0-15 Units  0-15 Units Subcutaneous TID WC Donne Hazel, MD   3 Units at 09/19/13 [id number]  . insulin aspart (novoLOG) injection 0-5 Units  0-5 Units Subcutaneous QHS Donne Hazel, MD      . metoprolol tartrate (LOPRESSOR) tablet 100 mg  100 mg Oral BID Donne Hazel, MD   100 mg at 09/19/13 1012  . morphine 2 MG/ML injection 2 mg  2 mg Intravenous Q4H PRN Donne Hazel, MD   2 mg at 09/17/13 1723  . niacin tablet 500 mg  500 mg Oral QHS Donne Hazel, MD   500 mg at 09/18/13 2202  . ondansetron (ZOFRAN) tablet 4 mg  4 mg Oral Q6H PRN Donne Hazel, MD       Or  . ondansetron [facility]) injection 4 mg  4 mg Intravenous Q6H PRN Donne Hazel, MD   4 mg at 09/17/13 1720  . simvastatin (ZOCOR) tablet 40 mg  40 mg Oral q1800 Donne Hazel, MD   40 mg at 09/18/13 1717  . sodium bicarbonate 150 mEq in sterile water 1,000 mL infusion   Intravenous Continuous Donne Hazel, MD 75 mL/hr at 09/19/13 0844         Allergies  Allergen Reactions  . Lisinopril     REACTION: acute renal failure: Patient is not familiar with this medication or an allergy  :  Family History  Problem Relation Age of Onset  . Diabetes Mother   . Heart attack Father   :  History   Social History  . Marital Status: Married    Spouse Name: N/A    Number of Children: 2  . Years of Education: N/A   Occupational History  . Retired     Cytogeneticist   Social History Main Topics  . Smoking status: Former Smoker -- 1.00 packs/day for 40 years    Quit date: 09/12/1991  . Smokeless tobacco: Never Used  . Alcohol Use: No  . Drug Use: No  . Sexual Activity: Not on file   Other Topics Concern  . Not on file   Social  History Narrative  . No narrative on file  :  Constitutional: negative for anorexia, chills and fevers Ears, nose, mouth, throat, and face: negative for epistaxis, nasal congestion and snoring Respiratory: negative for cough, dyspnea on exertion and hemoptysis Cardiovascular: negative for chest pain, exertional chest pressure/discomfort and irregular heart beat Gastrointestinal: negative for abdominal pain, diarrhea and melena Genitourinary:positive for retention.  and  hematuria Hematologic/lymphatic: negative for bleeding, easy bruising and lymphadenopathy Musculoskeletal:negative for arthralgias, back pain and bone pain Neurological: negative for gait problems and headaches Behavioral/Psych: negative for decreased appetite and depression Endocrine: negative for temperature intolerance  Exam: Blood pressure 150/65, pulse 93, temperature 98.1 F (36.7 C), temperature source Oral, resp. rate 20, height 5\' 9"  (1.753 m), weight 252 lb 6.8 oz (114.5 kg), SpO2 98.00%. General appearance: alert and cooperative Head: Normocephalic, without obvious abnormality, atraumatic Throat: lips, mucosa, and tongue normal; teeth and gums normal Neck: no adenopathy, no carotid bruit, no JVD, supple, symmetrical, trachea midline and thyroid not enlarged, symmetric, no tenderness/mass/nodules Back: symmetric, no curvature. ROM normal. No CVA tenderness. Resp: clear to auscultation bilaterally Chest wall: no tenderness Cardio: regular rate and rhythm, S1, S2 normal, no murmur, click, rub or gallop GI: soft, non-tender; bowel sounds normal; no masses,  no organomegaly Extremities: extremities normal, atraumatic, no cyanosis or edema Skin: Skin color, texture, turgor normal. No rashes or lesions Lymph nodes: Cervical, supraclavicular, and axillary nodes normal. Neurologic: Grossly normal   Recent Labs  09/18/13 0412 09/19/13 0500  WBC 10.1 11.9*  HGB 6.1* 7.3*  HCT 18.0* 21.4*  PLT 255 253     Recent Labs  09/18/13 1415 09/19/13 0500  NA 143 142  K 5.7* 4.2  CL 101 100  CO2 19 20  GLUCOSE 166* 181*  BUN 90* 79*  CREATININE 12.60* 9.69*  CALCIUM 8.8 8.6      Assessment and Plan:   80 year old gentleman with the following issues:  1. Locally advanced prostate cancer presented with a pelvic tumor with the pathology confirmed the presence of poorly differentiated cancer with small cell features. The natural course of this disease in general especially small cell cancer of the genitourinary tract was discussed with the patient and his family in detail. His case was also discussed in the genitourinary tumor board. His imaging studies were discussed with radiology and his pathology was reviewed. The ideal course of action in these aggressive tumors would be to try modality approach with chemotherapy, radiation and possible surgery. Clearly he is not a candidate for this aggressive approach. Our goal of treatment would be merely palliative as I do not feel appropriate cure will be achieved in his case. To better palliate his symptoms initially, I agree with Dr. Tammi Klippel that a course of radiation to achieve hemostasis and possibly relieve the obstruction. I discussed the rationale of using palliative systemic chemotherapy I feel that he would be a poor candidate for it at this time. I've also discussed with him the role of hospice in the future if and when condition deteriorates.  My recommendation will be at this point upon completion of his radiation therapy we will arrange for an outpatient followup to assess the status and decide whether to proceed with chemotherapy which would be extremely a likely versus proceeding with hospice which is more likely.  2. Acute renal failure: Related to obstruction from his bladder tumor. He status post nephrostomy tube placements with some improvement in his creatinine today.

## 2013-09-19 NOTE — Progress Notes (Signed)
OT Cancellation Note  Patient Details Name: Samuel Garner MRN: [id number] DOB: June 01, 1933   Cancelled Treatment:    Reason Eval/Treat Not Completed: Patient at procedure or test/ unavailable  Diani Jillson 09/19/2013, 1:50 PM Lesle Chris, OTR/L [phone] 09/19/2013

## 2013-09-19 NOTE — Progress Notes (Signed)
Patient ID: Samuel Garner, male   DOB: 1933-01-20, 80 y.o.   MRN: [id number] S:Bilateral perc tubes placed yesterday     CBI discontinued     Bloody urine in both percs and foley (and sheets, pad and gown all soaked in serosanguinous material)     Says there were clot issues last PM with the right PCN     Denies pain or SOB      O:BP 150/65  Pulse 93  Temp(Src) 98.1 F (36.7 C) (Oral)  Resp 20  Ht 5\' 9"  (1.753 m)  Wt 114.5 kg (252 lb 6.8 oz)  BMI 37.26 kg/m2  SpO2 98%  Intake/Output Summary (Last 24 hours) at 09/19/13 0713 Last data filed at 09/19/13 0700  Gross per 24 hour  Intake 6235.33 ml  Output   6100 ml  Net 135.33 ml   Intake/Output: I/O last 3 completed shifts: In: 10094.1 [P.O.:1062; I.V.:2548.8; Blood:783.3; Other:5700] Out: [id number]  Intake/Output this shift:    Weight change:  Gen:WD obese, WM, pale, NAD; awake and alert CVS:RRR no rub Resp:Anteriorly clear [id number], +BS, not tender Ext:1+ edema bilat LE's Urine from both PCN's as well as urine in foley all bloody (right PCN the worst) Pad, sheets and gown all with bloody secretions (Appears bloody urine leaking from around foley)  Recent Labs Lab 09/17/13 1500 09/17/13 2045 09/18/13 0412 09/18/13 1415 09/19/13 0500  NA 134* 138 141 143 142  K 6.4* 5.8* 5.6* 5.7* 4.2  CL 96 97 103 101 100  CO2 12* 15* 17* 19 20  GLUCOSE 188* 248* 173* 166* 181*  BUN 88* 88* 90* 90* 79*  CREATININE 11.39* 11.73* 12.29* 12.60* 9.69*  ALBUMIN 2.7* 2.5* 2.3* 2.3* 2.3*  CALCIUM 9.2 9.0 8.5 8.8 8.6  PHOS  --  7.5*  --  7.8* 6.2*  AST 10  --  9  --   --   ALT 7  --  6  --   --    Liver Function Tests:  Recent Labs Lab 09/17/13 1500  09/18/13 0412 09/18/13 1415 09/19/13 0500  AST 10  --  9  --   --   ALT 7  --  6  --   --   ALKPHOS 58  --  46  --   --   BILITOT 0.3  --  0.2*  --   --   PROT 6.8  --  5.6*  --   --   ALBUMIN 2.7*  < > 2.3* 2.3* 2.3*  < > = values in this interval not  displayed. No results found for this basename: LIPASE, AMYLASE,  in the last 168 hours No results found for this basename: AMMONIA,  in the last 168 hours CBC:  Recent Labs Lab 09/17/13 1500 09/18/13 0412 09/19/13 0500  WBC 14.3* 10.1 11.9*  HGB 7.7* 6.1* 7.3*  HCT 23.6* 18.0* 21.4*  MCV 87.1 86.1 85.6  PLT 327 255 253   Cardiac Enzymes: No results found for this basename: CKTOTAL, CKMB, CKMBINDEX, TROPONINI,  in the last 168 hours CBG:  Recent Labs Lab 09/17/13 2151 09/18/13 0719 09/18/13 1157 09/18/13 1649 09/18/13 2205  GLUCAP 195* 153* 135* 160* 175*    Iron Studies: No results found for this basename: IRON, TIBC, TRANSFERRIN, FERRITIN,  in the last 72 hours Studies/Results: US Renal  09/17/2013   CLINICAL DATA:  Hematuria  EXAM: RENAL/URINARY TRACT ULTRASOUND COMPLETE  COMPARISON:  08/31/2013  FINDINGS: Right Kidney:  Length: 11.5 cm in length. Mild  hydronephrosis is not significantly changed. No mass. Moderate cortical thinning.  Left Kidney:  Length: 13.0 cm in length. Mild hydronephrosis is not significantly changed. Moderate cortical thinning. No mass.  Bladder:  Foley catheter decompresses the bladder.  IMPRESSION: Stable mild bilateral hydronephrosis.   Electronically Signed   By: Maryclare Bean M.D.   On: 09/17/2013 20:06   Ir Perc Nephrostomy Left  09/18/2013   CLINICAL DATA:  Prostate carcinoma, acute renal insufficiency, bilateral hydronephrosis.  EXAM: BILATERAL PERCUTANEOUS NEPHROSTOMY CATHETER PLACEMENT UNDER ULTRASOUND AND FLUOROSCOPIC GUIDANCE  TECHNIQUE: The procedure, risks (including but not limited to bleeding, infection, organ damage  ), benefits, and alternatives were explained to the patient. Questions regarding the procedure were encouraged and answered. The patient understands and consents to the procedure.  BILATERALflank regionS prepped with Betadine, draped in usual sterile fashion, infiltrated locally with 1% lidocaine.As antibiotic prophylaxis, CEFAZOLIN  2 G was ordered pre-procedure and administered intravenously within one hour of incision.  Intravenous Fentanyl and Versed were administered as conscious sedation during continuous cardiorespiratory monitoring by the radiology RN, with a total moderate sedation time of 25 minutes.  Under real-time ultrasound guidance, a 21-gauge trocar needle was advanced into a posterior right renal calyx. Ultrasound image documentation was saved. Urine spontaneously returned through the needle. Needle was exchanged over a guidewire for transitional dilator. Contrast injection confirmed appropriate positioning. Catheter was exchanged over a guidewire for a 10 French pigtail catheter, formed centrally within the right renal collecting system. Contrast injection confirms appropriate positioning and patency.  In similar fashion, under real-time ultrasound guidance, a 21-gauge trocar needle was advanced into a posterior left renal calyx. Ultrasound image documentation was saved. Urine spontaneously returned through the needle. Needle was exchanged over a guidewire for transitional dilator. Contrast injection confirmed appropriate positioning. Catheter was exchanged over a guidewire for a 10 French pigtail catheter, formed centrally within the left renal collecting system. Contrast injection confirms appropriate positioning and patency.  Catheters secured externally with 0 Prolene suture and StatLock and placed to external drain bags. No immediate complication.  FLUOROSCOPY TIME:  4 min 30 seconds  IMPRESSION: 1. Technically successful bilateral percutaneous nephrostomy catheter placement.   Electronically Signed   By: Arne Cleveland M.D.   On: 09/18/2013 15:12   Ir Perc Nephrostomy Right  09/18/2013   CLINICAL DATA:  Prostate carcinoma, acute renal insufficiency, bilateral hydronephrosis.  EXAM: BILATERAL PERCUTANEOUS NEPHROSTOMY CATHETER PLACEMENT UNDER ULTRASOUND AND FLUOROSCOPIC GUIDANCE  TECHNIQUE: The procedure, risks  (including but not limited to bleeding, infection, organ damage  ), benefits, and alternatives were explained to the patient. Questions regarding the procedure were encouraged and answered. The patient understands and consents to the procedure.  BILATERALflank regionS prepped with Betadine, draped in usual sterile fashion, infiltrated locally with 1% lidocaine.As antibiotic prophylaxis, CEFAZOLIN 2 G was ordered pre-procedure and administered intravenously within one hour of incision.  Intravenous Fentanyl and Versed were administered as conscious sedation during continuous cardiorespiratory monitoring by the radiology RN, with a total moderate sedation time of 25 minutes.  Under real-time ultrasound guidance, a 21-gauge trocar needle was advanced into a posterior right renal calyx. Ultrasound image documentation was saved. Urine spontaneously returned through the needle. Needle was exchanged over a guidewire for transitional dilator. Contrast injection confirmed appropriate positioning. Catheter was exchanged over a guidewire for a 10 French pigtail catheter, formed centrally within the right renal collecting system. Contrast injection confirms appropriate positioning and patency.  In similar fashion, under real-time ultrasound guidance, a 21-gauge trocar needle was  advanced into a posterior left renal calyx. Ultrasound image documentation was saved. Urine spontaneously returned through the needle. Needle was exchanged over a guidewire for transitional dilator. Contrast injection confirmed appropriate positioning. Catheter was exchanged over a guidewire for a 10 French pigtail catheter, formed centrally within the left renal collecting system. Contrast injection confirms appropriate positioning and patency.  Catheters secured externally with 0 Prolene suture and StatLock and placed to external drain bags. No immediate complication.  FLUOROSCOPY TIME:  4 min 30 seconds  IMPRESSION: 1. Technically successful  bilateral percutaneous nephrostomy catheter placement.   Electronically Signed   By: Arne Cleveland M.D.   On: 09/18/2013 15:12   Ir US Guide Bx Asp/drain  09/18/2013   CLINICAL DATA:  Prostate carcinoma, acute renal insufficiency, bilateral hydronephrosis.  EXAM: BILATERAL PERCUTANEOUS NEPHROSTOMY CATHETER PLACEMENT UNDER ULTRASOUND AND FLUOROSCOPIC GUIDANCE  TECHNIQUE: The procedure, risks (including but not limited to bleeding, infection, organ damage  ), benefits, and alternatives were explained to the patient. Questions regarding the procedure were encouraged and answered. The patient understands and consents to the procedure.  BILATERALflank regionS prepped with Betadine, draped in usual sterile fashion, infiltrated locally with 1% lidocaine.As antibiotic prophylaxis, CEFAZOLIN 2 G was ordered pre-procedure and administered intravenously within one hour of incision.  Intravenous Fentanyl and Versed were administered as conscious sedation during continuous cardiorespiratory monitoring by the radiology RN, with a total moderate sedation time of 25 minutes.  Under real-time ultrasound guidance, a 21-gauge trocar needle was advanced into a posterior right renal calyx. Ultrasound image documentation was saved. Urine spontaneously returned through the needle. Needle was exchanged over a guidewire for transitional dilator. Contrast injection confirmed appropriate positioning. Catheter was exchanged over a guidewire for a 10 French pigtail catheter, formed centrally within the right renal collecting system. Contrast injection confirms appropriate positioning and patency.  In similar fashion, under real-time ultrasound guidance, a 21-gauge trocar needle was advanced into a posterior left renal calyx. Ultrasound image documentation was saved. Urine spontaneously returned through the needle. Needle was exchanged over a guidewire for transitional dilator. Contrast injection confirmed appropriate positioning. Catheter  was exchanged over a guidewire for a 10 French pigtail catheter, formed centrally within the left renal collecting system. Contrast injection confirms appropriate positioning and patency.  Catheters secured externally with 0 Prolene suture and StatLock and placed to external drain bags. No immediate complication.  FLUOROSCOPY TIME:  4 min 30 seconds  IMPRESSION: 1. Technically successful bilateral percutaneous nephrostomy catheter placement.   Electronically Signed   By: Arne Cleveland M.D.   On: 09/18/2013 15:12   Ir US Guide Bx Asp/drain  09/18/2013   CLINICAL DATA:  Prostate carcinoma, acute renal insufficiency, bilateral hydronephrosis.  EXAM: BILATERAL PERCUTANEOUS NEPHROSTOMY CATHETER PLACEMENT UNDER ULTRASOUND AND FLUOROSCOPIC GUIDANCE  TECHNIQUE: The procedure, risks (including but not limited to bleeding, infection, organ damage  ), benefits, and alternatives were explained to the patient. Questions regarding the procedure were encouraged and answered. The patient understands and consents to the procedure.  BILATERALflank regionS prepped with Betadine, draped in usual sterile fashion, infiltrated locally with 1% lidocaine.As antibiotic prophylaxis, CEFAZOLIN 2 G was ordered pre-procedure and administered intravenously within one hour of incision.  Intravenous Fentanyl and Versed were administered as conscious sedation during continuous cardiorespiratory monitoring by the radiology RN, with a total moderate sedation time of 25 minutes.  Under real-time ultrasound guidance, a 21-gauge trocar needle was advanced into a posterior right renal calyx. Ultrasound image documentation was saved. Urine spontaneously returned through the needle.  Needle was exchanged over a guidewire for transitional dilator. Contrast injection confirmed appropriate positioning. Catheter was exchanged over a guidewire for a 10 French pigtail catheter, formed centrally within the right renal collecting system. Contrast injection  confirms appropriate positioning and patency.  In similar fashion, under real-time ultrasound guidance, a 21-gauge trocar needle was advanced into a posterior left renal calyx. Ultrasound image documentation was saved. Urine spontaneously returned through the needle. Needle was exchanged over a guidewire for transitional dilator. Contrast injection confirmed appropriate positioning. Catheter was exchanged over a guidewire for a 10 French pigtail catheter, formed centrally within the left renal collecting system. Contrast injection confirms appropriate positioning and patency.  Catheters secured externally with 0 Prolene suture and StatLock and placed to external drain bags. No immediate complication.  FLUOROSCOPY TIME:  4 min 30 seconds  IMPRESSION: 1. Technically successful bilateral percutaneous nephrostomy catheter placement.   Electronically Signed   By: Arne Cleveland M.D.   On: 09/18/2013 15:12   SCHEDULED MEDICATIONS . calcitRIOL  0.25 mcg Oral Q M,W,F  . cholecalciferol  1,000 Units Oral q morning - 10a  . fenofibrate  54 mg Oral Daily  . insulin aspart  0-15 Units Subcutaneous TID WC  . insulin aspart  0-5 Units Subcutaneous QHS  . metoprolol  100 mg Oral BID  . niacin  500 mg Oral QHS  . simvastatin  40 mg Oral q1800   . sodium chloride 20 mL/hr at 09/17/13 1736  .  sodium bicarbonate 150 mEq in sterile water 1000 mL infusion 75 mL/hr at 09/18/13 1840   Assessment/Plan:  1. AKI/CKD/Obstructive uropathy- most likely secondary to obstructive uropathy. Had gross hematuria with large clots but did not appear to be draining his bladder much with CBI. Worrisome for upper tract obstruction.  No improvement of BUN/CR despite UOP.  No nephrotoxic agents identified. Now s/p bilateral perc tubes with fall in creatinine from 12.6-->9.6.  IR will need to F/U today re patency of right PCN in particular; quantitate UOP from each PCN  + foley 2. Hyperkalemia- improved 3. Metabolic acidosis- secondary to  #1. Continued improvement with isotonic sodium bicarb; will continue this for now 4. Anemia- ABLA/anemia of malignancy. S/p transfusion; hb up to 7.3 5. 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  Will await tumor board's review 6. HTN- cont with outpt  7. DM- cont with insulin and COH-modified diet 8. Protein malnutrition 9. Deconditioning   Secily Walthour B

## 2013-09-20 DIAGNOSIS — D649 Anemia, unspecified: Secondary | ICD-10-CM

## 2013-09-20 DIAGNOSIS — N179 Acute kidney failure, unspecified: Secondary | ICD-10-CM

## 2013-09-20 DIAGNOSIS — E119 Type 2 diabetes mellitus without complications: Secondary | ICD-10-CM

## 2013-09-20 DIAGNOSIS — N189 Chronic kidney disease, unspecified: Secondary | ICD-10-CM

## 2013-09-20 DIAGNOSIS — M109 Gout, unspecified: Secondary | ICD-10-CM

## 2013-09-20 LAB — GLUCOSE, CAPILLARY
Glucose-Capillary: 187 mg/dL — ABNORMAL HIGH (ref 70–99)
Glucose-Capillary: 155 mg/dL — ABNORMAL HIGH (ref 70–99)
Glucose-Capillary: 198 mg/dL — ABNORMAL HIGH (ref 70–99)
Glucose-Capillary: 188 mg/dL — ABNORMAL HIGH (ref 70–99)

## 2013-09-20 LAB — CBC
HEMATOCRIT: 20.9 % — AB (ref 39.0–52.0)
Hemoglobin: 7 g/dL — ABNORMAL LOW (ref 13.0–17.0)
MCH: 29.2 pg (ref 26.0–34.0)
MCHC: 33.5 g/dL (ref 30.0–36.0)
MCV: 87.1 fL (ref 78.0–100.0)
Platelets: 236 10*3/uL (ref 150–400)
RBC: 2.4 MIL/uL — AB (ref 4.22–5.81)
RDW: 14.7 % (ref 11.5–15.5)
WBC: 9.8 10*3/uL (ref 4.0–10.5)

## 2013-09-20 LAB — RENAL FUNCTION PANEL
Albumin: 2.2 g/dL — ABNORMAL LOW (ref 3.5–5.2)
BUN: 64 mg/dL — AB (ref 6–23)
CO2: 27 mEq/L (ref 19–32)
Calcium: 8.3 mg/dL — ABNORMAL LOW (ref 8.4–10.5)
Chloride: 101 mEq/L (ref 96–112)
Creatinine, Ser: 6.77 mg/dL — ABNORMAL HIGH (ref 0.50–1.35)
GFR calc Af Amer: 8 mL/min — ABNORMAL LOW (ref >90)
GFR calc non Af Amer: 7 mL/min — ABNORMAL LOW (ref >90)
GLUCOSE: 182 mg/dL — AB (ref 70–99)
PHOSPHORUS: 4.5 mg/dL (ref 2.3–4.6)
Potassium: 3.7 mEq/L (ref 3.7–5.3)
Sodium: 146 mEq/L (ref 137–147)

## 2013-09-20 MED ORDER — BELLADONNA ALKALOIDS-OPIUM 16.2-60 MG RE SUPP
1.0000 | Freq: Four times a day (QID) | RECTAL | Status: DC | PRN
  Filled 2013-09-20: qty 1

## 2013-09-20 NOTE — Progress Notes (Signed)
[facility] PROGRESS NOTE  Samuel Garner RWE:[id number] DOB: [birth year] DOA: 09/17/2013 PCP: Delphina Cahill, MD  Assessment/Plan: 1. Acute renal failure  1. Likely post-obstructive, concerns for upper tract obstruction 2. Cont with IVF as tolerated 3. Nephrology following 4. Cont to avoid nephrotoxic drugs (including ACEI from home med list) 5. Renal fx continuing to improve with nephrostomy tubes 6. Appreciate Nephrology and Urology input 2. Hyperkalemia  1. S/p insulin and kayexalate in the ED 2. Resolved 3. DM  1. Cont with SSI as tolerated 4. HTN  1. Stable 2. Cont regimen 5. Hx gout  1. Hold allopurinol given ARF 6. Hematuria  1. S/p foley cath 2. Urology following 7. Anemia  1. Cont to follow CBC and transfuse as needed 2. Likely related to hematuria 8. Prostate cancer 1. Oncology notes reviewed. Overall poor prognosis 2. Not a candidate for surgery 3. Recommendations for outpatient follow up to discuss chemo vs hospice 9. DVT prophylaxis  1. SCD's for prophylaxis  Code Status: Full Family Communication: Pt in room (indicate person spoken with, relationship, and if by phone, the number) Disposition Plan: Pending  Consultants:  Nephrology  Urology  IR  Procedures:  CBI per Urology  Nephrostomy tubes per IR 09/18/13  HPI/Subjective: No acute events noted overnight. No complaints  Objective: Filed Vitals:   09/19/13 1323 09/19/13 1906 09/19/13 2043 09/20/13 0546  BP: 127/52  143/56 153/63  Pulse: 92 92 91 92  Temp: 98.1 F (36.7 C)  99.2 F (37.3 C) 97.7 F (36.5 C)  TempSrc: Oral  Oral Oral  Resp: 20  20 20   Height:      Weight:      SpO2: 97%  95% 96%    Intake/Output Summary (Last 24 hours) at 09/20/13 1306 Last data filed at 09/20/13 0900  Gross per 24 hour  Intake 2654.75 ml  Output   3095 ml  Net -440.25 ml   Filed Weights   09/17/13 2026  Weight: 114.5 kg (252 lb 6.8 oz)    Exam:   General:  Awake, in  nad  Cardiovascular: regular, s1, s2  Respiratory: normal resp effort, no wheezing  Abdomen: soft, nondistended  Musculoskeletal: perfused, no clubbing   Data Reviewed: Basic Metabolic Panel:  Recent Labs Lab 09/17/13 1500 09/17/13 2045 09/18/13 0412 09/18/13 1415 09/19/13 0500 09/20/13 0340  NA 134* 138 141 143 142 146  K 6.4* 5.8* 5.6* 5.7* 4.2 3.7  CL 96 97 103 101 100 101  CO2 12* 15* 17* 19 20 27   GLUCOSE 188* 248* 173* 166* 181* 182*  BUN 88* 88* 90* 90* 79* 64*  CREATININE 11.39* 11.73* 12.29* 12.60* 9.69* 6.77*  CALCIUM 9.2 9.0 8.5 8.8 8.6 8.3*  PHOS  --  7.5*  --  7.8* 6.2* 4.5   Liver Function Tests:  Recent Labs Lab 09/17/13 1500 09/17/13 2045 09/18/13 0412 09/18/13 1415 09/19/13 0500 09/20/13 0340  AST 10  --  9  --   --   --   ALT 7  --  6  --   --   --   ALKPHOS 58  --  46  --   --   --   BILITOT 0.3  --  0.2*  --   --   --   PROT 6.8  --  5.6*  --   --   --   ALBUMIN 2.7* 2.5* 2.3* 2.3* 2.3* 2.2*   No results found for this basename: LIPASE, AMYLASE,  in the last 168  hours No results found for this basename: AMMONIA,  in the last 168 hours CBC:  Recent Labs Lab 09/17/13 1500 09/18/13 0412 09/19/13 0500 09/20/13 0340  WBC 14.3* 10.1 11.9* 9.8  HGB 7.7* 6.1* 7.3* 7.0*  HCT 23.6* 18.0* 21.4* 20.9*  MCV 87.1 86.1 85.6 87.1  PLT 327 255 253 236   Cardiac Enzymes: No results found for this basename: CKTOTAL, CKMB, CKMBINDEX, TROPONINI,  in the last 168 hours BNP (last 3 results)  Recent Labs  09/01/13 0513  PROBNP 2536.0*   CBG:  Recent Labs Lab 09/19/13 0802 09/19/13 1151 09/19/13 1710 09/19/13 1931 09/19/13 2051  GLUCAP 160* 183* 211* 179* 194*    Recent Results (from the past 240 hour(s))  URINE CULTURE     Status: None   Collection Time    09/17/13  4:44 PM      Result Value Range Status   Specimen Description URINE, CATHETERIZED   Final   Special Requests NONE   Final   Culture  Setup Time     Final   Value:  09/17/2013 21:43     Performed at [place]     Final   Value: NO GROWTH     Performed at [facility]   Culture     Final   Value: NO GROWTH     Performed at [facility]   Report Status 09/18/2013 FINAL   Final     Studies: Ir Perc Nephrostomy Left  09/18/2013   CLINICAL DATA:  Prostate carcinoma, acute renal insufficiency, bilateral hydronephrosis.  EXAM: BILATERAL PERCUTANEOUS NEPHROSTOMY CATHETER PLACEMENT UNDER ULTRASOUND AND FLUOROSCOPIC GUIDANCE  TECHNIQUE: The procedure, risks (including but not limited to bleeding, infection, organ damage  ), benefits, and alternatives were explained to the patient. Questions regarding the procedure were encouraged and answered. The patient understands and consents to the procedure.  BILATERALflank regionS prepped with Betadine, draped in usual sterile fashion, infiltrated locally with 1% lidocaine.As antibiotic prophylaxis, CEFAZOLIN 2 G was ordered pre-procedure and administered intravenously within one hour of incision.  Intravenous Fentanyl and Versed were administered as conscious sedation during continuous cardiorespiratory monitoring by the radiology RN, with a total moderate sedation time of 25 minutes.  Under real-time ultrasound guidance, a 21-gauge trocar needle was advanced into a posterior right renal calyx. Ultrasound image documentation was saved. Urine spontaneously returned through the needle. Needle was exchanged over a guidewire for transitional dilator. Contrast injection confirmed appropriate positioning. Catheter was exchanged over a guidewire for a 10 French pigtail catheter, formed centrally within the right renal collecting system. Contrast injection confirms appropriate positioning and patency.  In similar fashion, under real-time ultrasound guidance, a 21-gauge trocar needle was advanced into a posterior left renal calyx. Ultrasound image documentation was saved. Urine spontaneously returned  through the needle. Needle was exchanged over a guidewire for transitional dilator. Contrast injection confirmed appropriate positioning. Catheter was exchanged over a guidewire for a 10 French pigtail catheter, formed centrally within the left renal collecting system. Contrast injection confirms appropriate positioning and patency.  Catheters secured externally with 0 Prolene suture and StatLock and placed to external drain bags. No immediate complication.  FLUOROSCOPY TIME:  4 min 30 seconds  IMPRESSION: 1. Technically successful bilateral percutaneous nephrostomy catheter placement.   Electronically Signed   By: Arne Cleveland M.D.   On: 09/18/2013 15:12   Ir Perc Nephrostomy Right  09/18/2013   CLINICAL DATA:  Prostate carcinoma, acute renal insufficiency, bilateral hydronephrosis.  EXAM: BILATERAL PERCUTANEOUS NEPHROSTOMY CATHETER PLACEMENT UNDER ULTRASOUND AND FLUOROSCOPIC GUIDANCE  TECHNIQUE: The procedure, risks (including but not limited to bleeding, infection, organ damage  ), benefits, and alternatives were explained to the patient. Questions regarding the procedure were encouraged and answered. The patient understands and consents to the procedure.  BILATERALflank regionS prepped with Betadine, draped in usual sterile fashion, infiltrated locally with 1% lidocaine.As antibiotic prophylaxis, CEFAZOLIN 2 G was ordered pre-procedure and administered intravenously within one hour of incision.  Intravenous Fentanyl and Versed were administered as conscious sedation during continuous cardiorespiratory monitoring by the radiology RN, with a total moderate sedation time of 25 minutes.  Under real-time ultrasound guidance, a 21-gauge trocar needle was advanced into a posterior right renal calyx. Ultrasound image documentation was saved. Urine spontaneously returned through the needle. Needle was exchanged over a guidewire for transitional dilator. Contrast injection confirmed appropriate positioning. Catheter  was exchanged over a guidewire for a 10 French pigtail catheter, formed centrally within the right renal collecting system. Contrast injection confirms appropriate positioning and patency.  In similar fashion, under real-time ultrasound guidance, a 21-gauge trocar needle was advanced into a posterior left renal calyx. Ultrasound image documentation was saved. Urine spontaneously returned through the needle. Needle was exchanged over a guidewire for transitional dilator. Contrast injection confirmed appropriate positioning. Catheter was exchanged over a guidewire for a 10 French pigtail catheter, formed centrally within the left renal collecting system. Contrast injection confirms appropriate positioning and patency.  Catheters secured externally with 0 Prolene suture and StatLock and placed to external drain bags. No immediate complication.  FLUOROSCOPY TIME:  4 min 30 seconds  IMPRESSION: 1. Technically successful bilateral percutaneous nephrostomy catheter placement.   Electronically Signed   By: Arne Cleveland M.D.   On: 09/18/2013 15:12   Ir US Guide Bx Asp/drain  09/18/2013   CLINICAL DATA:  Prostate carcinoma, acute renal insufficiency, bilateral hydronephrosis.  EXAM: BILATERAL PERCUTANEOUS NEPHROSTOMY CATHETER PLACEMENT UNDER ULTRASOUND AND FLUOROSCOPIC GUIDANCE  TECHNIQUE: The procedure, risks (including but not limited to bleeding, infection, organ damage  ), benefits, and alternatives were explained to the patient. Questions regarding the procedure were encouraged and answered. The patient understands and consents to the procedure.  BILATERALflank regionS prepped with Betadine, draped in usual sterile fashion, infiltrated locally with 1% lidocaine.As antibiotic prophylaxis, CEFAZOLIN 2 G was ordered pre-procedure and administered intravenously within one hour of incision.  Intravenous Fentanyl and Versed were administered as conscious sedation during continuous cardiorespiratory monitoring by the  radiology RN, with a total moderate sedation time of 25 minutes.  Under real-time ultrasound guidance, a 21-gauge trocar needle was advanced into a posterior right renal calyx. Ultrasound image documentation was saved. Urine spontaneously returned through the needle. Needle was exchanged over a guidewire for transitional dilator. Contrast injection confirmed appropriate positioning. Catheter was exchanged over a guidewire for a 10 French pigtail catheter, formed centrally within the right renal collecting system. Contrast injection confirms appropriate positioning and patency.  In similar fashion, under real-time ultrasound guidance, a 21-gauge trocar needle was advanced into a posterior left renal calyx. Ultrasound image documentation was saved. Urine spontaneously returned through the needle. Needle was exchanged over a guidewire for transitional dilator. Contrast injection confirmed appropriate positioning. Catheter was exchanged over a guidewire for a 10 French pigtail catheter, formed centrally within the left renal collecting system. Contrast injection confirms appropriate positioning and patency.  Catheters secured externally with 0 Prolene suture and StatLock and placed to external drain bags. No immediate complication.  FLUOROSCOPY  TIME:  4 min 30 seconds  IMPRESSION: 1. Technically successful bilateral percutaneous nephrostomy catheter placement.   Electronically Signed   By: Arne Cleveland M.D.   On: 09/18/2013 15:12   Ir US Guide Bx Asp/drain  09/18/2013   CLINICAL DATA:  Prostate carcinoma, acute renal insufficiency, bilateral hydronephrosis.  EXAM: BILATERAL PERCUTANEOUS NEPHROSTOMY CATHETER PLACEMENT UNDER ULTRASOUND AND FLUOROSCOPIC GUIDANCE  TECHNIQUE: The procedure, risks (including but not limited to bleeding, infection, organ damage  ), benefits, and alternatives were explained to the patient. Questions regarding the procedure were encouraged and answered. The patient understands and consents  to the procedure.  BILATERALflank regionS prepped with Betadine, draped in usual sterile fashion, infiltrated locally with 1% lidocaine.As antibiotic prophylaxis, CEFAZOLIN 2 G was ordered pre-procedure and administered intravenously within one hour of incision.  Intravenous Fentanyl and Versed were administered as conscious sedation during continuous cardiorespiratory monitoring by the radiology RN, with a total moderate sedation time of 25 minutes.  Under real-time ultrasound guidance, a 21-gauge trocar needle was advanced into a posterior right renal calyx. Ultrasound image documentation was saved. Urine spontaneously returned through the needle. Needle was exchanged over a guidewire for transitional dilator. Contrast injection confirmed appropriate positioning. Catheter was exchanged over a guidewire for a 10 French pigtail catheter, formed centrally within the right renal collecting system. Contrast injection confirms appropriate positioning and patency.  In similar fashion, under real-time ultrasound guidance, a 21-gauge trocar needle was advanced into a posterior left renal calyx. Ultrasound image documentation was saved. Urine spontaneously returned through the needle. Needle was exchanged over a guidewire for transitional dilator. Contrast injection confirmed appropriate positioning. Catheter was exchanged over a guidewire for a 10 French pigtail catheter, formed centrally within the left renal collecting system. Contrast injection confirms appropriate positioning and patency.  Catheters secured externally with 0 Prolene suture and StatLock and placed to external drain bags. No immediate complication.  FLUOROSCOPY TIME:  4 min 30 seconds  IMPRESSION: 1. Technically successful bilateral percutaneous nephrostomy catheter placement.   Electronically Signed   By: Arne Cleveland M.D.   On: 09/18/2013 15:12    Scheduled Meds: . calcitRIOL  0.25 mcg Oral Q M,W,F  . cholecalciferol  1,000 Units Oral q morning  - 10a  . feeding supplement (NEPRO CARB STEADY)  237 mL Oral Q24H  . fenofibrate  54 mg Oral Daily  . insulin aspart  0-15 Units Subcutaneous TID WC  . insulin aspart  0-5 Units Subcutaneous QHS  . metoprolol  100 mg Oral BID  . niacin  500 mg Oral QHS  . simvastatin  40 mg Oral q1800   Continuous Infusions: . sodium chloride 20 mL/hr at 09/17/13 1736  .  sodium bicarbonate 150 mEq in sterile water 1000 mL infusion 75 mL/hr at 09/20/13 0130    Principal Problem:   Obstructive uropathy Active Problems:   Diabetes mellitus, type 2   Hypertension   Chronic kidney disease   Gross hematuria   ARF (acute renal failure)  Time spent: 67min  [facility], [facility] Pager [phone]. If 7PM-7AM, please contact night-coverage at [email or web address], password [facility] 09/20/2013, 1:06 PM  LOS: 3 days

## 2013-09-20 NOTE — Progress Notes (Signed)
Subjective: B PCN placed 1/8 Draining well Bun/Cr decreasing Output blood tinged from PCNs- but better Up in chair eating breakfast  Objective: Vital signs in last 24 hours: Temp:  [97.7 F (36.5 C)-99.2 F (37.3 C)] 97.7 F (36.5 C) (01/10 0546) Pulse Rate:  [91-92] 92 (01/10 0546) Resp:  [20] 20 (01/10 0546) BP: (127-153)/(52-63) 153/63 mmHg (01/10 0546) SpO2:  [95 %-97 %] 96 % (01/10 0546) Last BM Date: 09/19/13  Intake/Output from previous day: 01/09 0701 - 01/10 0700 In: 2654.8 [P.O.:840; I.V.:1758.8] Out: 3320 [Urine:3320] Intake/Output this shift:   PE:  Afeb; vss Up in chair; in NAD B PCNs intact Sites clean and dry; NT Output over 1 liter yesterday Sl blood tinged Bun/Cr: 64/6.7 (79/9.7   Lab Results:   Recent Labs  09/19/13 0500 09/20/13 0340  WBC 11.9* 9.8  HGB 7.3* 7.0*  HCT 21.4* 20.9*  PLT 253 236   BMET  Recent Labs  09/19/13 0500 09/20/13 0340  NA 142 146  K 4.2 3.7  CL 100 101  CO2 20 27  GLUCOSE 181* 182*  BUN 79* 64*  CREATININE 9.69* 6.77*  CALCIUM 8.6 8.3*   PT/INR  Recent Labs  09/18/13 1130  LABPROT 15.6*  INR 1.27   ABG No results found for this basename: PHART, PCO2, PO2, HCO3,  in the last 72 hours  Studies/Results: Ir Perc Nephrostomy Left  09/18/2013   CLINICAL DATA:  Prostate carcinoma, acute renal insufficiency, bilateral hydronephrosis.  EXAM: BILATERAL PERCUTANEOUS NEPHROSTOMY CATHETER PLACEMENT UNDER ULTRASOUND AND FLUOROSCOPIC GUIDANCE  TECHNIQUE: The procedure, risks (including but not limited to bleeding, infection, organ damage  ), benefits, and alternatives were explained to the patient. Questions regarding the procedure were encouraged and answered. The patient understands and consents to the procedure.  BILATERALflank regionS prepped with Betadine, draped in usual sterile fashion, infiltrated locally with 1% lidocaine.As antibiotic prophylaxis, CEFAZOLIN 2 G was ordered pre-procedure and administered  intravenously within one hour of incision.  Intravenous Fentanyl and Versed were administered as conscious sedation during continuous cardiorespiratory monitoring by the radiology RN, with a total moderate sedation time of 25 minutes.  Under real-time ultrasound guidance, a 21-gauge trocar needle was advanced into a posterior right renal calyx. Ultrasound image documentation was saved. Urine spontaneously returned through the needle. Needle was exchanged over a guidewire for transitional dilator. Contrast injection confirmed appropriate positioning. Catheter was exchanged over a guidewire for a 10 French pigtail catheter, formed centrally within the right renal collecting system. Contrast injection confirms appropriate positioning and patency.  In similar fashion, under real-time ultrasound guidance, a 21-gauge trocar needle was advanced into a posterior left renal calyx. Ultrasound image documentation was saved. Urine spontaneously returned through the needle. Needle was exchanged over a guidewire for transitional dilator. Contrast injection confirmed appropriate positioning. Catheter was exchanged over a guidewire for a 10 French pigtail catheter, formed centrally within the left renal collecting system. Contrast injection confirms appropriate positioning and patency.  Catheters secured externally with 0 Prolene suture and StatLock and placed to external drain bags. No immediate complication.  FLUOROSCOPY TIME:  4 min 30 seconds  IMPRESSION: 1. Technically successful bilateral percutaneous nephrostomy catheter placement.   Electronically Signed   By: Arne Cleveland M.D.   On: 09/18/2013 15:12   Ir Perc Nephrostomy Right  09/18/2013   CLINICAL DATA:  Prostate carcinoma, acute renal insufficiency, bilateral hydronephrosis.  EXAM: BILATERAL PERCUTANEOUS NEPHROSTOMY CATHETER PLACEMENT UNDER ULTRASOUND AND FLUOROSCOPIC GUIDANCE  TECHNIQUE: The procedure, risks (including but not limited to  bleeding, infection, organ  damage  ), benefits, and alternatives were explained to the patient. Questions regarding the procedure were encouraged and answered. The patient understands and consents to the procedure.  BILATERALflank regionS prepped with Betadine, draped in usual sterile fashion, infiltrated locally with 1% lidocaine.As antibiotic prophylaxis, CEFAZOLIN 2 G was ordered pre-procedure and administered intravenously within one hour of incision.  Intravenous Fentanyl and Versed were administered as conscious sedation during continuous cardiorespiratory monitoring by the radiology RN, with a total moderate sedation time of 25 minutes.  Under real-time ultrasound guidance, a 21-gauge trocar needle was advanced into a posterior right renal calyx. Ultrasound image documentation was saved. Urine spontaneously returned through the needle. Needle was exchanged over a guidewire for transitional dilator. Contrast injection confirmed appropriate positioning. Catheter was exchanged over a guidewire for a 10 French pigtail catheter, formed centrally within the right renal collecting system. Contrast injection confirms appropriate positioning and patency.  In similar fashion, under real-time ultrasound guidance, a 21-gauge trocar needle was advanced into a posterior left renal calyx. Ultrasound image documentation was saved. Urine spontaneously returned through the needle. Needle was exchanged over a guidewire for transitional dilator. Contrast injection confirmed appropriate positioning. Catheter was exchanged over a guidewire for a 10 French pigtail catheter, formed centrally within the left renal collecting system. Contrast injection confirms appropriate positioning and patency.  Catheters secured externally with 0 Prolene suture and StatLock and placed to external drain bags. No immediate complication.  FLUOROSCOPY TIME:  4 min 30 seconds  IMPRESSION: 1. Technically successful bilateral percutaneous nephrostomy catheter placement.    Electronically Signed   By: Arne Cleveland M.D.   On: 09/18/2013 15:12   Ir US Guide Bx Asp/drain  09/18/2013   CLINICAL DATA:  Prostate carcinoma, acute renal insufficiency, bilateral hydronephrosis.  EXAM: BILATERAL PERCUTANEOUS NEPHROSTOMY CATHETER PLACEMENT UNDER ULTRASOUND AND FLUOROSCOPIC GUIDANCE  TECHNIQUE: The procedure, risks (including but not limited to bleeding, infection, organ damage  ), benefits, and alternatives were explained to the patient. Questions regarding the procedure were encouraged and answered. The patient understands and consents to the procedure.  BILATERALflank regionS prepped with Betadine, draped in usual sterile fashion, infiltrated locally with 1% lidocaine.As antibiotic prophylaxis, CEFAZOLIN 2 G was ordered pre-procedure and administered intravenously within one hour of incision.  Intravenous Fentanyl and Versed were administered as conscious sedation during continuous cardiorespiratory monitoring by the radiology RN, with a total moderate sedation time of 25 minutes.  Under real-time ultrasound guidance, a 21-gauge trocar needle was advanced into a posterior right renal calyx. Ultrasound image documentation was saved. Urine spontaneously returned through the needle. Needle was exchanged over a guidewire for transitional dilator. Contrast injection confirmed appropriate positioning. Catheter was exchanged over a guidewire for a 10 French pigtail catheter, formed centrally within the right renal collecting system. Contrast injection confirms appropriate positioning and patency.  In similar fashion, under real-time ultrasound guidance, a 21-gauge trocar needle was advanced into a posterior left renal calyx. Ultrasound image documentation was saved. Urine spontaneously returned through the needle. Needle was exchanged over a guidewire for transitional dilator. Contrast injection confirmed appropriate positioning. Catheter was exchanged over a guidewire for a 10 French pigtail  catheter, formed centrally within the left renal collecting system. Contrast injection confirms appropriate positioning and patency.  Catheters secured externally with 0 Prolene suture and StatLock and placed to external drain bags. No immediate complication.  FLUOROSCOPY TIME:  4 min 30 seconds  IMPRESSION: 1. Technically successful bilateral percutaneous nephrostomy catheter placement.   Electronically Signed  By: Arne Cleveland M.D.   On: 09/18/2013 15:12   Ir US Guide Bx Asp/drain  09/18/2013   CLINICAL DATA:  Prostate carcinoma, acute renal insufficiency, bilateral hydronephrosis.  EXAM: BILATERAL PERCUTANEOUS NEPHROSTOMY CATHETER PLACEMENT UNDER ULTRASOUND AND FLUOROSCOPIC GUIDANCE  TECHNIQUE: The procedure, risks (including but not limited to bleeding, infection, organ damage  ), benefits, and alternatives were explained to the patient. Questions regarding the procedure were encouraged and answered. The patient understands and consents to the procedure.  BILATERALflank regionS prepped with Betadine, draped in usual sterile fashion, infiltrated locally with 1% lidocaine.As antibiotic prophylaxis, CEFAZOLIN 2 G was ordered pre-procedure and administered intravenously within one hour of incision.  Intravenous Fentanyl and Versed were administered as conscious sedation during continuous cardiorespiratory monitoring by the radiology RN, with a total moderate sedation time of 25 minutes.  Under real-time ultrasound guidance, a 21-gauge trocar needle was advanced into a posterior right renal calyx. Ultrasound image documentation was saved. Urine spontaneously returned through the needle. Needle was exchanged over a guidewire for transitional dilator. Contrast injection confirmed appropriate positioning. Catheter was exchanged over a guidewire for a 10 French pigtail catheter, formed centrally within the right renal collecting system. Contrast injection confirms appropriate positioning and patency.  In similar  fashion, under real-time ultrasound guidance, a 21-gauge trocar needle was advanced into a posterior left renal calyx. Ultrasound image documentation was saved. Urine spontaneously returned through the needle. Needle was exchanged over a guidewire for transitional dilator. Contrast injection confirmed appropriate positioning. Catheter was exchanged over a guidewire for a 10 French pigtail catheter, formed centrally within the left renal collecting system. Contrast injection confirms appropriate positioning and patency.  Catheters secured externally with 0 Prolene suture and StatLock and placed to external drain bags. No immediate complication.  FLUOROSCOPY TIME:  4 min 30 seconds  IMPRESSION: 1. Technically successful bilateral percutaneous nephrostomy catheter placement.   Electronically Signed   By: Arne Cleveland M.D.   On: 09/18/2013 15:12    Anti-infectives: Anti-infectives   Start     Dose/Rate Route Frequency Ordered Stop   09/18/13 1030  ceFAZolin (ANCEF) IVPB 2 g/50 mL premix    Comments:  On call to IR for procedure 09/18/13   2 g 100 mL/hr over 30 Minutes Intravenous  Once 09/18/13 1010 09/18/13 1130      Assessment/Plan: s/p * No surgery found *  B PCNs intact Doing well   LOS: 3 days    Aaisha Sliter A 09/20/2013

## 2013-09-20 NOTE — Progress Notes (Signed)
Urology Progress Note : 80 yo male from [facility],  presented with 4-5 wk history of gross hematuria. On Aug 15, 2013 CT showed a large mass on posterior floor of bladder suspicious for large prostate median lobe/prostate ca.He developed gross hematuria w/ clots on Dec 18 and Foley catheter was inserted at [facility]. The patient returned on Dec 19 with clot retention and was transferred to [facility] where he underwent operating room cystoscopy and clot evacuation, TURP of median lobe and excision/biopsy of bladder neck mass in addition to removal of a bladder calculus found intraoperatively. The patient hospital course was complicated by acute on chronic renal failure, and urinary retention. His creatinine rose to 4.24. In addition, the patient was unable to void following a voiding trial while in the hospital, a catheter was replaced and he was followed up here for a voiding trial 1 week later. He passed his voiding trial at that time and no foley catheter needed.  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 He had blood work done at [facility] office showed creatinine 9.83, BUN 78, and K 6.4. He was admitted again for percutaneous nephrostomy tube which were placed urgently on 09/18/2013.   He is awake and alert this AM, with continued grossly bloody urine.     Subjective:     No acute urologic events overnight. Ambulation:   positive Flatus:    positive Bowel movement  positive  Pain: some relief  Objective:  Blood pressure 153/63, pulse 92, temperature 97.7 F (36.5 C), temperature source Oral, resp. rate 20, height 5\' 9"  (1.753 m), weight 114.5 kg (252 lb 6.8 oz), SpO2 96.00%.  Physical Exam:  General:  No acute distress, awake Extremities: extremities normal, atraumatic, no cyanosis or edema Genitourinary:  wnl Foley:  Bloody urine.     I/O last 3  completed shifts: In: 4574.8 [P.O.:960; I.V.:3558.8; Other:56] Out: 8546 [Urine:5045]  Recent Labs     09/19/13  0500  09/20/13  0340  HGB  7.3*  7.0*  WBC  11.9*  9.8  PLT  253  236    Recent Labs     09/19/13  0500  09/20/13  0340  NA  142  146  K  4.2  3.7  CL  100  101  CO2  20  27  BUN  79*  64*  CREATININE  9.69*  6.77*  CALCIUM  8.6  8.3*  GFRNONAA  4*  7*  GFRAA  5*  8*     Recent Labs     09/18/13  1130  INR  1.27      Assessment/Plan: Urologically improving.   P: continue to follow. }

## 2013-09-20 NOTE — Progress Notes (Signed)
Patient ID: Samuel Garner, male   DOB: 05-Aug-1933, 80 y.o.   MRN: [id number] S: Perc tubes (1/8) draining well     Still bloody and bloody urine in foley     More awake and alert; appetite still not great but drinking fine - up in chair with family and friends in the room     Denies pain or SOB      O:BP 153/63  Pulse 92  Temp(Src) 97.7 F (36.5 C) (Oral)  Resp 20  Ht 5\' 9"  (1.753 m)  Wt 114.5 kg (252 lb 6.8 oz)  BMI 37.26 kg/m2  SpO2 96%  Intake/Output Summary (Last 24 hours) at 09/20/13 1323 Last data filed at 09/20/13 0900  Gross per 24 hour  Intake 2414.75 ml  Output   1995 ml  Net 419.75 ml   Intake/Output: I/O last 3 completed shifts: In: 4574.8 [P.O.:960; I.V.:3558.8; Other:56] Out: 5045 [Urine:5045]  Intake/Output this shift:  Total I/O In: 240 [P.O.:240] Out: -  Weight change:  Gen:WD obese, WM, NAD; awake and alert CVS:RRR no rub Resp:Anteriorly clear [id number], +BS, not tender Ext:1+ edema bilat LE's Urine from both PCN's as well as urine in foley all bloody but urine from percs clearer than it has been P Recent Labs Lab 09/17/13 1500 09/17/13 2045 09/18/13 0412 09/18/13 1415 09/19/13 0500 09/20/13 0340  NA 134* 138 141 143 142 146  K 6.4* 5.8* 5.6* 5.7* 4.2 3.7  CL 96 97 103 101 100 101  CO2 12* 15* 17* 19 20 27   GLUCOSE 188* 248* 173* 166* 181* 182*  BUN 88* 88* 90* 90* 79* 64*  CREATININE 11.39* 11.73* 12.29* 12.60* 9.69* 6.77*  ALBUMIN 2.7* 2.5* 2.3* 2.3* 2.3* 2.2*  CALCIUM 9.2 9.0 8.5 8.8 8.6 8.3*  PHOS  --  7.5*  --  7.8* 6.2* 4.5  AST 10  --  9  --   --   --   ALT 7  --  6  --   --   --    Liver Function Tests:  Recent Labs Lab 09/17/13 1500  09/18/13 0412 09/18/13 1415 09/19/13 0500 09/20/13 0340  AST 10  --  9  --   --   --   ALT 7  --  6  --   --   --   ALKPHOS 58  --  46  --   --   --   BILITOT 0.3  --  0.2*  --   --   --   PROT 6.8  --  5.6*  --   --   --   ALBUMIN 2.7*  < > 2.3* 2.3* 2.3* 2.2*  < > = values in this  interval not displayed. No results found for this basename: LIPASE, AMYLASE,  in the last 168 hours No results found for this basename: AMMONIA,  in the last 168 hours CBC:  Recent Labs Lab 09/17/13 1500 09/18/13 0412 09/19/13 0500 09/20/13 0340  WBC 14.3* 10.1 11.9* 9.8  HGB 7.7* 6.1* 7.3* 7.0*  HCT 23.6* 18.0* 21.4* 20.9*  MCV 87.1 86.1 85.6 87.1  PLT 327 255 253 236   Cardiac Enzymes: No results found for this basename: CKTOTAL, CKMB, CKMBINDEX, TROPONINI,  in the last 168 hours CBG:  Recent Labs Lab 09/19/13 0802 09/19/13 1151 09/19/13 1710 09/19/13 1931 09/19/13 2051  GLUCAP 160* 183* 211* 179* 194*    Iron Studies: No results found for this basename: IRON, TIBC, TRANSFERRIN, FERRITIN,  in the last 72  hours Studies/Results: Ir Perc Nephrostomy Left  09/18/2013   CLINICAL DATA:  Prostate carcinoma, acute renal insufficiency, bilateral hydronephrosis.  EXAM: BILATERAL PERCUTANEOUS NEPHROSTOMY CATHETER PLACEMENT UNDER ULTRASOUND AND FLUOROSCOPIC GUIDANCE  TECHNIQUE: The procedure, risks (including but not limited to bleeding, infection, organ damage  ), benefits, and alternatives were explained to the patient. Questions regarding the procedure were encouraged and answered. The patient understands and consents to the procedure.  BILATERALflank regionS prepped with Betadine, draped in usual sterile fashion, infiltrated locally with 1% lidocaine.As antibiotic prophylaxis, CEFAZOLIN 2 G was ordered pre-procedure and administered intravenously within one hour of incision.  Intravenous Fentanyl and Versed were administered as conscious sedation during continuous cardiorespiratory monitoring by the radiology RN, with a total moderate sedation time of 25 minutes.  Under real-time ultrasound guidance, a 21-gauge trocar needle was advanced into a posterior right renal calyx. Ultrasound image documentation was saved. Urine spontaneously returned through the needle. Needle was exchanged over a  guidewire for transitional dilator. Contrast injection confirmed appropriate positioning. Catheter was exchanged over a guidewire for a 10 French pigtail catheter, formed centrally within the right renal collecting system. Contrast injection confirms appropriate positioning and patency.  In similar fashion, under real-time ultrasound guidance, a 21-gauge trocar needle was advanced into a posterior left renal calyx. Ultrasound image documentation was saved. Urine spontaneously returned through the needle. Needle was exchanged over a guidewire for transitional dilator. Contrast injection confirmed appropriate positioning. Catheter was exchanged over a guidewire for a 10 French pigtail catheter, formed centrally within the left renal collecting system. Contrast injection confirms appropriate positioning and patency.  Catheters secured externally with 0 Prolene suture and StatLock and placed to external drain bags. No immediate complication.  FLUOROSCOPY TIME:  4 min 30 seconds  IMPRESSION: 1. Technically successful bilateral percutaneous nephrostomy catheter placement.   Electronically Signed   By: Arne Cleveland M.D.   On: 09/18/2013 15:12   Ir Perc Nephrostomy Right  09/18/2013   CLINICAL DATA:  Prostate carcinoma, acute renal insufficiency, bilateral hydronephrosis.  EXAM: BILATERAL PERCUTANEOUS NEPHROSTOMY CATHETER PLACEMENT UNDER ULTRASOUND AND FLUOROSCOPIC GUIDANCE  TECHNIQUE: The procedure, risks (including but not limited to bleeding, infection, organ damage  ), benefits, and alternatives were explained to the patient. Questions regarding the procedure were encouraged and answered. The patient understands and consents to the procedure.  BILATERALflank regionS prepped with Betadine, draped in usual sterile fashion, infiltrated locally with 1% lidocaine.As antibiotic prophylaxis, CEFAZOLIN 2 G was ordered pre-procedure and administered intravenously within one hour of incision.  Intravenous Fentanyl and Versed  were administered as conscious sedation during continuous cardiorespiratory monitoring by the radiology RN, with a total moderate sedation time of 25 minutes.  Under real-time ultrasound guidance, a 21-gauge trocar needle was advanced into a posterior right renal calyx. Ultrasound image documentation was saved. Urine spontaneously returned through the needle. Needle was exchanged over a guidewire for transitional dilator. Contrast injection confirmed appropriate positioning. Catheter was exchanged over a guidewire for a 10 French pigtail catheter, formed centrally within the right renal collecting system. Contrast injection confirms appropriate positioning and patency.  In similar fashion, under real-time ultrasound guidance, a 21-gauge trocar needle was advanced into a posterior left renal calyx. Ultrasound image documentation was saved. Urine spontaneously returned through the needle. Needle was exchanged over a guidewire for transitional dilator. Contrast injection confirmed appropriate positioning. Catheter was exchanged over a guidewire for a 10 French pigtail catheter, formed centrally within the left renal collecting system. Contrast injection confirms appropriate positioning and patency.  Catheters secured externally with 0 Prolene suture and StatLock and placed to external drain bags. No immediate complication.  FLUOROSCOPY TIME:  4 min 30 seconds  IMPRESSION: 1. Technically successful bilateral percutaneous nephrostomy catheter placement.   Electronically Signed   By: Arne Cleveland M.D.   On: 09/18/2013 15:12   Ir US Guide Bx Asp/drain  09/18/2013   CLINICAL DATA:  Prostate carcinoma, acute renal insufficiency, bilateral hydronephrosis.  EXAM: BILATERAL PERCUTANEOUS NEPHROSTOMY CATHETER PLACEMENT UNDER ULTRASOUND AND FLUOROSCOPIC GUIDANCE  TECHNIQUE: The procedure, risks (including but not limited to bleeding, infection, organ damage  ), benefits, and alternatives were explained to the patient.  Questions regarding the procedure were encouraged and answered. The patient understands and consents to the procedure.  BILATERALflank regionS prepped with Betadine, draped in usual sterile fashion, infiltrated locally with 1% lidocaine.As antibiotic prophylaxis, CEFAZOLIN 2 G was ordered pre-procedure and administered intravenously within one hour of incision.  Intravenous Fentanyl and Versed were administered as conscious sedation during continuous cardiorespiratory monitoring by the radiology RN, with a total moderate sedation time of 25 minutes.  Under real-time ultrasound guidance, a 21-gauge trocar needle was advanced into a posterior right renal calyx. Ultrasound image documentation was saved. Urine spontaneously returned through the needle. Needle was exchanged over a guidewire for transitional dilator. Contrast injection confirmed appropriate positioning. Catheter was exchanged over a guidewire for a 10 French pigtail catheter, formed centrally within the right renal collecting system. Contrast injection confirms appropriate positioning and patency.  In similar fashion, under real-time ultrasound guidance, a 21-gauge trocar needle was advanced into a posterior left renal calyx. Ultrasound image documentation was saved. Urine spontaneously returned through the needle. Needle was exchanged over a guidewire for transitional dilator. Contrast injection confirmed appropriate positioning. Catheter was exchanged over a guidewire for a 10 French pigtail catheter, formed centrally within the left renal collecting system. Contrast injection confirms appropriate positioning and patency.  Catheters secured externally with 0 Prolene suture and StatLock and placed to external drain bags. No immediate complication.  FLUOROSCOPY TIME:  4 min 30 seconds  IMPRESSION: 1. Technically successful bilateral percutaneous nephrostomy catheter placement.   Electronically Signed   By: Arne Cleveland M.D.   On: 09/18/2013 15:12    Ir US Guide Bx Asp/drain  09/18/2013   CLINICAL DATA:  Prostate carcinoma, acute renal insufficiency, bilateral hydronephrosis.  EXAM: BILATERAL PERCUTANEOUS NEPHROSTOMY CATHETER PLACEMENT UNDER ULTRASOUND AND FLUOROSCOPIC GUIDANCE  TECHNIQUE: The procedure, risks (including but not limited to bleeding, infection, organ damage  ), benefits, and alternatives were explained to the patient. Questions regarding the procedure were encouraged and answered. The patient understands and consents to the procedure.  BILATERALflank regionS prepped with Betadine, draped in usual sterile fashion, infiltrated locally with 1% lidocaine.As antibiotic prophylaxis, CEFAZOLIN 2 G was ordered pre-procedure and administered intravenously within one hour of incision.  Intravenous Fentanyl and Versed were administered as conscious sedation during continuous cardiorespiratory monitoring by the radiology RN, with a total moderate sedation time of 25 minutes.  Under real-time ultrasound guidance, a 21-gauge trocar needle was advanced into a posterior right renal calyx. Ultrasound image documentation was saved. Urine spontaneously returned through the needle. Needle was exchanged over a guidewire for transitional dilator. Contrast injection confirmed appropriate positioning. Catheter was exchanged over a guidewire for a 10 French pigtail catheter, formed centrally within the right renal collecting system. Contrast injection confirms appropriate positioning and patency.  In similar fashion, under real-time ultrasound guidance, a 21-gauge trocar needle was advanced into a posterior left renal  calyx. Ultrasound image documentation was saved. Urine spontaneously returned through the needle. Needle was exchanged over a guidewire for transitional dilator. Contrast injection confirmed appropriate positioning. Catheter was exchanged over a guidewire for a 10 French pigtail catheter, formed centrally within the left renal collecting system.  Contrast injection confirms appropriate positioning and patency.  Catheters secured externally with 0 Prolene suture and StatLock and placed to external drain bags. No immediate complication.  FLUOROSCOPY TIME:  4 min 30 seconds  IMPRESSION: 1. Technically successful bilateral percutaneous nephrostomy catheter placement.   Electronically Signed   By: Arne Cleveland M.D.   On: 09/18/2013 15:12   SCHEDULED MEDICATIONS . calcitRIOL  0.25 mcg Oral Q M,W,F  . cholecalciferol  1,000 Units Oral q morning - 10a  . feeding supplement (NEPRO CARB STEADY)  237 mL Oral Q24H  . fenofibrate  54 mg Oral Daily  . insulin aspart  0-15 Units Subcutaneous TID WC  . insulin aspart  0-5 Units Subcutaneous QHS  . metoprolol  100 mg Oral BID  . niacin  500 mg Oral QHS  . simvastatin  40 mg Oral q1800   . sodium chloride 20 mL/hr at 09/17/13 1736  .  sodium bicarbonate 150 mEq in sterile water 1000 mL infusion 75 mL/hr at 09/20/13 0130   Assessment/Plan:  1. AKI/CKD/Obstructive uropathy- Since placement of bilateral per tubes, gratifying improvement in renal function.  Good UOP, creatinine falling nicely  12.6-->9.6-->6.7 with normalization of potassium and normal bicarb now. Will d/c isotonic sodium bicarbonate.  Followup renal function in the AM. Not sure how much recovery to expect - best creatinine in the past month was 3.45 on 09/02/13  2. Anemia- ABLA/anemia of malignancy. S/p transfusion of 2 units; Hb 7. Check iron studies (can replete IV if need be) 3. 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 HTN- cont meds 5. DM- cont with insulin and COH-modified diet 6. Protein malnutrition 7. Deconditioning   Samuel Garner

## 2013-09-21 ENCOUNTER — Encounter: Payer: Self-pay | Admitting: Radiation Oncology

## 2013-09-21 LAB — RENAL FUNCTION PANEL
Albumin: 2.1 g/dL — ABNORMAL LOW (ref 3.5–5.2)
BUN: 47 mg/dL — ABNORMAL HIGH (ref 6–23)
CO2: 28 mEq/L (ref 19–32)
Calcium: 8.1 mg/dL — ABNORMAL LOW (ref 8.4–10.5)
Chloride: 102 mEq/L (ref 96–112)
Creatinine, Ser: 3.85 mg/dL — ABNORMAL HIGH (ref 0.50–1.35)
GFR calc non Af Amer: 14 mL/min — ABNORMAL LOW (ref >90)
GFR, EST AFRICAN AMERICAN: 16 mL/min — AB (ref >90)
Glucose, Bld: 201 mg/dL — ABNORMAL HIGH (ref 70–99)
PHOSPHORUS: 2.9 mg/dL (ref 2.3–4.6)
POTASSIUM: 3.2 meq/L — AB (ref 3.7–5.3)
SODIUM: 145 meq/L (ref 137–147)

## 2013-09-21 LAB — GLUCOSE, CAPILLARY
Glucose-Capillary: 166 mg/dL — ABNORMAL HIGH (ref 70–99)
Glucose-Capillary: 165 mg/dL — ABNORMAL HIGH (ref 70–99)
Glucose-Capillary: 182 mg/dL — ABNORMAL HIGH (ref 70–99)
Glucose-Capillary: 163 mg/dL — ABNORMAL HIGH (ref 70–99)

## 2013-09-21 LAB — CBC
HCT: 20.4 % — ABNORMAL LOW (ref 39.0–52.0)
Hemoglobin: 6.6 g/dL — CL (ref 13.0–17.0)
MCH: 28.7 pg (ref 26.0–34.0)
MCHC: 32.4 g/dL (ref 30.0–36.0)
MCV: 88.7 fL (ref 78.0–100.0)
Platelets: 263 10*3/uL (ref 150–400)
RBC: 2.3 MIL/uL — ABNORMAL LOW (ref 4.22–5.81)
RDW: 14.7 % (ref 11.5–15.5)
WBC: 8.7 10*3/uL (ref 4.0–10.5)

## 2013-09-21 LAB — IRON AND TIBC
IRON: 18 ug/dL — AB (ref 42–135)
SATURATION RATIOS: 9 % — AB (ref 20–55)
TIBC: 204 ug/dL — AB (ref 215–435)
UIBC: 186 ug/dL (ref 125–400)

## 2013-09-21 LAB — PREPARE RBC (CROSSMATCH)

## 2013-09-21 MED ORDER — POTASSIUM CHLORIDE CRYS ER 20 MEQ PO TBCR
40.0000 meq | EXTENDED_RELEASE_TABLET | Freq: Once | ORAL | Status: AC
  Administered 2013-09-21: 40 meq via ORAL
  Filled 2013-09-21: qty 2

## 2013-09-21 MED ORDER — FERUMOXYTOL INJECTION 510 MG/17 ML
510.0000 mg | INTRAVENOUS | Status: AC
  Administered 2013-09-21 – 2013-09-24 (×2): 510 mg via INTRAVENOUS
  Filled 2013-09-21 (×2): qty 17

## 2013-09-21 NOTE — Progress Notes (Signed)
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 ARF, clot retention. Has bilateral PCN's. Continues to have bloody urine.   Subjective:     No acute urologic events overnight. Ambulation:   negative. Up in chair Flatus:    positive Bowel movement  positive  Pain: some relief  Objective:  Blood pressure 142/63, pulse 95, temperature 99.1 F (37.3 C), temperature source Oral, resp. rate 18, height 5\' 9"  (1.753 m), weight 114.5 kg (252 lb 6.8 oz), SpO2 96.00%.  Physical Exam:  General:  No acute distress, awake Extremities: extremities normal, atraumatic, no cyanosis or edema Genitourinary:  normal Foley:  Yes. bloody    I/O last 3 completed shifts: In: 2183.5 [P.O.:720; I.V.:1407.5; Other:56] Out: 1610 [RUEAV:[id number]]  Recent Labs     09/20/13  0340  09/21/13  0418  HGB  7.0*  6.6*  WBC  9.8  8.7  PLT  236  263    Recent Labs     09/20/13  0340  09/21/13  0418  NA  146  145  K  3.7  3.2*  CL  101  102  CO2  27  28  BUN  64*  47*  CREATININE  6.77*  3.85*  CALCIUM  8.3*  8.1*  GFRNONAA  7*  14*  GFRAA  8*  16*     Recent Labs     09/18/13  1130  INR  1.27     No components found with this basename: ABG,   Assessment/Plan:  Continue with foley and bilateral percs.  Dr. Louis Meckel to see tomorrow.

## 2013-09-21 NOTE — Progress Notes (Signed)
OT Cancellation Note  Patient Details Name: Samuel Garner MRN: [id number] DOB: 11/03/1932   Cancelled Treatment:    Reason Eval/Treat Not Completed: Spoke with RN. Pt now getting blood in which will finish later this afternoon. Will recheck on pt next day for OT eval.  [place], Thereasa Parkin 09/21/2013, 11:34 AM

## 2013-09-21 NOTE — Progress Notes (Signed)
[facility] PROGRESS NOTE  Samuel Garner [id number] DOB: [birth year] DOA: 09/17/2013 PCP: Delphina Cahill, MD  Assessment/Plan: 1. Acute renal failure  1. Likely post-obstructive, concerns for upper tract obstruction 2. Cont with IVF as tolerated 3. Nephrology following 4. Cont to avoid nephrotoxic drugs (including ACEI from home med list) 5. Renal fx has improved nicely (nearly at baseline creatine today) s/p nephrostomy tubes 6. Appreciate Nephrology and Urology assistance 2. Hyperkalemia  1. S/p insulin and kayexalate in the ED 2. Low today, will replace 3. DM  1. Cont with SSI as tolerated 4. HTN  1. Stable 2. Cont regimen 5. Hx gout  1. Hold allopurinol given ARF 6. Hematuria  1. S/p foley cath 2. Urology following 7. Anemia  1. Cont to follow CBC 2. Hgb <7 this AM, given 1 unit PRBC 3. Likely related to hematuria 8. Prostate cancer 1. Oncology notes reviewed. Overall poor prognosis 2. Not a candidate for surgery 3. Recommendation for radiation tx starting on 09/22/13 9. DVT prophylaxis  1. SCD's for prophylaxis  Code Status: Full Family Communication: Pt in room (indicate person spoken with, relationship, and if by phone, the number) Disposition Plan: Pending  Consultants:  Nephrology  Urology  IR  Procedures:  CBI per Urology  Nephrostomy tubes per IR 09/18/13  HPI/Subjective: No acute events noted overnight. No complaints  Objective: Filed Vitals:   09/20/13 1637 09/20/13 2025 09/21/13 0423 09/21/13 1100  BP: 139/61 124/56 142/63 135/56  Pulse: 88 94 95 76  Temp: 97.8 F (36.6 C) 98.5 F (36.9 C) 99.1 F (37.3 C) 98.5 F (36.9 C)  TempSrc: Oral Oral Oral Oral  Resp: 20 18 18 20   Height:      Weight:      SpO2: 99% 98% 96%     Intake/Output Summary (Last 24 hours) at 09/21/13 1112 Last data filed at 09/21/13 1008  Gross per 24 hour  Intake   1070 ml  Output   2800 ml  Net  -1730 ml   Filed Weights   09/17/13 2026  Weight:  114.5 kg (252 lb 6.8 oz)    Exam:   General:  Awake, in nad  Cardiovascular: regular, s1, s2  Respiratory: normal resp effort, no wheezing  Abdomen: soft, nondistended  Musculoskeletal: perfused, no clubbing   Data Reviewed: Basic Metabolic Panel:  Recent Labs Lab 09/17/13 2045 09/18/13 0412 09/18/13 1415 09/19/13 0500 09/20/13 0340 09/21/13 0418  NA 138 141 143 142 146 145  K 5.8* 5.6* 5.7* 4.2 3.7 3.2*  CL 97 103 101 100 101 102  CO2 15* 17* 19 20 27 28   GLUCOSE 248* 173* 166* 181* 182* 201*  BUN 88* 90* 90* 79* 64* 47*  CREATININE 11.73* 12.29* 12.60* 9.69* 6.77* 3.85*  CALCIUM 9.0 8.5 8.8 8.6 8.3* 8.1*  PHOS 7.5*  --  7.8* 6.2* 4.5 2.9   Liver Function Tests:  Recent Labs Lab 09/17/13 1500  09/18/13 0412 09/18/13 1415 09/19/13 0500 09/20/13 0340 09/21/13 0418  AST 10  --  9  --   --   --   --   ALT 7  --  6  --   --   --   --   ALKPHOS 58  --  46  --   --   --   --   BILITOT 0.3  --  0.2*  --   --   --   --   PROT 6.8  --  5.6*  --   --   --   --  ALBUMIN 2.7*  < > 2.3* 2.3* 2.3* 2.2* 2.1*  < > = values in this interval not displayed. No results found for this basename: LIPASE, AMYLASE,  in the last 168 hours No results found for this basename: AMMONIA,  in the last 168 hours CBC:  Recent Labs Lab 09/17/13 1500 09/18/13 0412 09/19/13 0500 09/20/13 0340 09/21/13 0418  WBC 14.3* 10.1 11.9* 9.8 8.7  HGB 7.7* 6.1* 7.3* 7.0* 6.6*  HCT 23.6* 18.0* 21.4* 20.9* 20.4*  MCV 87.1 86.1 85.6 87.1 88.7  PLT 327 255 253 236 263   Cardiac Enzymes: No results found for this basename: CKTOTAL, CKMB, CKMBINDEX, TROPONINI,  in the last 168 hours BNP (last 3 results)  Recent Labs  09/01/13 0513  PROBNP 2536.0*   CBG:  Recent Labs Lab 09/20/13 0807 09/20/13 1216 09/20/13 1711 09/20/13 2148 09/21/13 0745  GLUCAP 187* 155* 198* 188* 166*    Recent Results (from the past 240 hour(s))  URINE CULTURE     Status: None   Collection Time     09/17/13  4:44 PM      Result Value Range Status   Specimen Description URINE, CATHETERIZED   Final   Special Requests NONE   Final   Culture  Setup Time     Final   Value: 09/17/2013 21:43     Performed at [facility]     Final   Value: NO GROWTH     Performed at [facility]   Culture     Final   Value: NO GROWTH     Performed at [facility]   Report Status 09/18/2013 FINAL   Final     Studies: No results found.  Scheduled Meds: . calcitRIOL  0.25 mcg Oral Q M,W,F  . cholecalciferol  1,000 Units Oral q morning - 10a  . feeding supplement (NEPRO CARB STEADY)  237 mL Oral Q24H  . fenofibrate  54 mg Oral Daily  . insulin aspart  0-15 Units Subcutaneous TID WC  . insulin aspart  0-5 Units Subcutaneous QHS  . metoprolol  100 mg Oral BID  . niacin  500 mg Oral QHS  . simvastatin  40 mg Oral q1800   Continuous Infusions: . sodium chloride 10 mL/hr at 09/20/13 2206    Principal Problem:   Obstructive uropathy Active Problems:   Diabetes mellitus, type 2   Hypertension   Chronic kidney disease   Gross hematuria   ARF (acute renal failure)  Time spent: 33min  [facility], [facility] Pager [phone]. If 7PM-7AM, please contact night-coverage at [email or web address], password [facility] 09/21/2013, 11:12 AM  LOS: 4 days

## 2013-09-21 NOTE — Progress Notes (Signed)
CRITICAL VALUE ALERT  Critical value received:  hgb 6.6  Date of notification:  09/21/13  Time of notification:  0635  Critical value read back:yes  Nurse who received alert:  Wendee Beavers, RN  MD notified (1st page):  M. Donnal Debar, NP  Time of first page:  [phone]  MD notified (2nd page):  Time of second page:  Responding MD:  M. Donnal Debar, NP  Time MD responded:  [phone]

## 2013-09-21 NOTE — Progress Notes (Signed)
  Radiation Oncology         [phone] ________________________________  Name: Samuel Garner MRN: [id number]  Date: 09/17/2013  DOB: 1933/01/26  Chart Note:  I reviewed this patient's most recent findings and wanted to take a minute to document my impression.  His creatinine continues to improve s/p nephrostomy tubes.  Plan to initiate palliative radiotherapy tomorrow.  ________________________________  Sheral Apley Tammi Klippel, M.D.

## 2013-09-21 NOTE — Progress Notes (Signed)
  Radiation Oncology         [phone] ________________________________  Name: Samuel Garner MRN: [id number]  Date: 09/19/2013  DOB: October 10, 1932  SIMULATION AND TREATMENT PLANNING NOTE  DIAGNOSIS:  80 y.o. gentleman with locally advanced small cell carcinoma of the prostate with hematuria and acute renal failure  NARRATIVE:  The patient was brought to the [facility].  Identity was confirmed.  All relevant records and images related to the planned course of therapy were reviewed.  The patient freely provided informed written consent to proceed with treatment after reviewing the details related to the planned course of therapy. The consent form was witnessed and verified by the simulation staff.  Then, the patient was set-up in a stable reproducible supine position for radiation therapy.  A vacuum lock pillow device was custom fabricated to position his legs in a reproducible immobilized position.  CT images were obtained.  Surface markings were placed.  The CT images were loaded into the planning software.  Then the prostate target and avoidance structures including the rectum, bladder, bowel and hips were contoured.  Treatment planning then occurred.  The radiation prescription was entered and confirmed.  A total of 5 complex treatment devices were fabricated including the custom pillow leg holder and 4 MLC apertures to shild critical structures such as bowel, bladder and hips. I have requested : Isodose Plan.  PLAN:  The patient will receive 30 Gy in 10 fractions.  The patient will start radiotherapy on 09/22/13  ________________________________  Sheral Apley. Tammi Klippel, M.D.

## 2013-09-21 NOTE — Progress Notes (Signed)
  Subjective: B PCNs placed 1/8 So much improvement in renal fxn Pt up in chair; eating well   Objective: Vital signs in last 24 hours: Temp:  [97.8 F (36.6 C)-99.1 F (37.3 C)] 99.1 F (37.3 C) (01/11 0423) Pulse Rate:  [88-95] 95 (01/11 0423) Resp:  [18-20] 18 (01/11 0423) BP: (124-148)/(56-63) 142/63 mmHg (01/11 0423) SpO2:  [96 %-99 %] 96 % (01/11 0423) Last BM Date: 09/19/13  Intake/Output from previous day: 01/10 0701 - 01/11 0700 In: 1010 [P.O.:480; I.V.:530] Out: 2500 [Urine:2500] Intake/Output this shift:    PE:  Afeb; vss B PCNs intact Sites clean and dry; NT Output: Rt: 1.5 L              Lt: 1 L  -- both still blood tinged- less on left Output of foley bloody Hg: 6.6 today Bun/Cr: 47/3.85 (64/6.7)   Lab Results:   Recent Labs  09/20/13 0340 09/21/13 0418  WBC 9.8 8.7  HGB 7.0* 6.6*  HCT 20.9* 20.4*  PLT 236 263   BMET  Recent Labs  09/20/13 0340 09/21/13 0418  NA 146 145  K 3.7 3.2*  CL 101 102  CO2 27 28  GLUCOSE 182* 201*  BUN 64* 47*  CREATININE 6.77* 3.85*  CALCIUM 8.3* 8.1*   PT/INR  Recent Labs  09/18/13 1130  LABPROT 15.6*  INR 1.27   ABG No results found for this basename: PHART, PCO2, PO2, HCO3,  in the last 72 hours  Studies/Results: No results found.  Anti-infectives: Anti-infectives   Start     Dose/Rate Route Frequency Ordered Stop   09/18/13 1030  ceFAZolin (ANCEF) IVPB 2 g/50 mL premix    Comments:  On call to IR for procedure 09/18/13   2 g 100 mL/hr over 30 Minutes Intravenous  Once 09/18/13 1010 09/18/13 1130      Assessment/Plan: s/p * No surgery found *  B PCNs intact Doing well Renal fxn improving Plan per Renal   LOS: 4 days    Micaella Gitto A 09/21/2013

## 2013-09-21 NOTE — Progress Notes (Signed)
Patient ID: Samuel Garner, male   DOB: 01/15/1933, 80 y.o.   MRN: [id number] S: Perc tubes (1/8) draining well     Still bloody and bloody urine in foley     Denies pain or SOB     Transfusion required for Hb <7 - blood currently infusing      O:BP 144/79  Pulse 81  Temp(Src) 98.7 F (37.1 C) (Oral)  Resp 20  Ht 5\' 9"  (1.753 m)  Wt 114.5 kg (252 lb 6.8 oz)  BMI 37.26 kg/m2  SpO2 96%  Intake/Output Summary (Last 24 hours) at 09/21/13 1322 Last data filed at 09/21/13 1110  Gross per 24 hour  Intake   1382 ml  Output   2800 ml  Net  -1418 ml   Intake/Output: I/O last 3 completed shifts: In: 2183.5 [P.O.:720; I.V.:1407.5; Other:56] Out: 4070 [Urine:4070]  Intake/Output this shift:  Total I/O In: 612 [P.O.:300; Blood:312] Out: 300 [Urine:300] Weight change:  Gen:WD obese, WM, NAD; awake and alert; up in the chair CVS:RRR no rub Resp:Anteriorly clear BZJ:IRCVELFYB, +BS, not tender Ext:1+ edema bilat LE's Urine from both PCN's as well as urine in foley all bloody but urine from percs clearer    Recent Labs Lab 09/17/13 1500 09/17/13 2045 09/18/13 0412 09/18/13 1415 09/19/13 0500 09/20/13 0340 09/21/13 0418  NA 134* 138 141 143 142 146 145  K 6.4* 5.8* 5.6* 5.7* 4.2 3.7 3.2*  CL 96 97 103 101 100 101 102  CO2 12* 15* 17* 19 20 27 28   GLUCOSE 188* 248* 173* 166* 181* 182* 201*  BUN 88* 88* 90* 90* 79* 64* 47*  CREATININE 11.39* 11.73* 12.29* 12.60* 9.69* 6.77* 3.85*  ALBUMIN 2.7* 2.5* 2.3* 2.3* 2.3* 2.2* 2.1*  CALCIUM 9.2 9.0 8.5 8.8 8.6 8.3* 8.1*  PHOS  --  7.5*  --  7.8* 6.2* 4.5 2.9  AST 10  --  9  --   --   --   --   ALT 7  --  6  --   --   --   --    Liver Function Tests:  Recent Labs Lab 09/17/13 1500  09/18/13 0412  09/19/13 0500 09/20/13 0340 09/21/13 0418  AST 10  --  9  --   --   --   --   ALT 7  --  6  --   --   --   --   ALKPHOS 58  --  46  --   --   --   --   BILITOT 0.3  --  0.2*  --   --   --   --   PROT 6.8  --  5.6*  --   --   --   --    ALBUMIN 2.7*  < > 2.3*  < > 2.3* 2.2* 2.1*  < > = values in this interval not displayed. No results found for this basename: LIPASE, AMYLASE,  in the last 168 hours No results found for this basename: AMMONIA,  in the last 168 hours CBC:  Recent Labs Lab 09/17/13 1500 09/18/13 0412 09/19/13 0500 09/20/13 0340 09/21/13 0418  WBC 14.3* 10.1 11.9* 9.8 8.7  HGB 7.7* 6.1* 7.3* 7.0* 6.6*  HCT 23.6* 18.0* 21.4* 20.9* 20.4*  MCV 87.1 86.1 85.6 87.1 88.7  PLT 327 255 253 236 263   Cardiac Enzymes: No results found for this basename: CKTOTAL, CKMB, CKMBINDEX, TROPONINI,  in the last 168 hours CBG:  Recent Labs Lab  09/20/13 1216 09/20/13 1711 09/20/13 2148 09/21/13 0745 09/21/13 1230  GLUCAP 155* 198* 188* 166* 165*   Results for Samuel, Garner (MRN [id number]) as of 09/21/2013 13:22  Ref. Range 09/21/2013 04:18  Iron Latest Range: 42-135 ug/dL 18 (L)  UIBC Latest Range: 125-400 ug/dL 186  TIBC Latest Range: 215-435 ug/dL 204 (L)  Saturation Ratios Latest Range: 20-55 % 9 (L)    SCHEDULED MEDICATIONS . calcitRIOL  0.25 mcg Oral Q M,W,F  . cholecalciferol  1,000 Units Oral q morning - 10a  . feeding supplement (NEPRO CARB STEADY)  237 mL Oral Q24H  . fenofibrate  54 mg Oral Daily  . insulin aspart  0-15 Units Subcutaneous TID WC  . insulin aspart  0-5 Units Subcutaneous QHS  . metoprolol  100 mg Oral BID  . niacin  500 mg Oral QHS  . simvastatin  40 mg Oral q1800   . sodium chloride 10 mL/hr at 09/20/13 2206   Assessment/Recommendations:   1. AKI/CKD/Obstructive uropathy- Since placement of bilateral per tubes, marked improvement in renal function.  Good UOP, creatinine falling nicely  12.6-->9.6-->6.7-->3.85 today.  Bicarb remains normal off drip.  K actually low now and to be replaced today.  Conceivable he could improve to below his most recent baseline with correction of obstruction (best creatinine in the past month was 3.45 on 09/02/13)  2. Anemia- ABLA/anemia of  malignancy/Fe deficient. S/p transfusion of 2 units and getting a third today.  Will give 2 doses of IV Feraheme 3 days apart for the iron deficiency component of his anemia 3. 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 HTN- cont meds 5. 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 Protein malnutrition 8. Deconditioning   Hanifah Royse B

## 2013-09-22 ENCOUNTER — Ambulatory Visit
Admit: 2013-09-22 | Discharge: 2013-09-22 | Disposition: A | Discharge status: Home / Self Care | Payer: Medicare Other | Attending: Radiation Oncology | Admitting: Radiation Oncology

## 2013-09-22 DIAGNOSIS — R310 Gross hematuria: Secondary | ICD-10-CM

## 2013-09-22 DIAGNOSIS — C61 Malignant neoplasm of prostate: Secondary | ICD-10-CM

## 2013-09-22 LAB — CBC
HEMATOCRIT: 23.9 % — AB (ref 39.0–52.0)
Hemoglobin: 7.6 g/dL — ABNORMAL LOW (ref 13.0–17.0)
MCH: 28.8 pg (ref 26.0–34.0)
MCHC: 31.8 g/dL (ref 30.0–36.0)
MCV: 90.5 fL (ref 78.0–100.0)
PLATELETS: 265 10*3/uL (ref 150–400)
RBC: 2.64 MIL/uL — AB (ref 4.22–5.81)
RDW: 14.8 % (ref 11.5–15.5)
WBC: 7.1 10*3/uL (ref 4.0–10.5)

## 2013-09-22 LAB — TYPE AND SCREEN
ABO/RH(D): O POS
Antibody Screen: NEGATIVE
Unit division: 0

## 2013-09-22 LAB — RENAL FUNCTION PANEL
ALBUMIN: 2.1 g/dL — AB (ref 3.5–5.2)
BUN: 33 mg/dL — AB (ref 6–23)
CHLORIDE: 103 meq/L (ref 96–112)
CO2: 27 mEq/L (ref 19–32)
Calcium: 8.3 mg/dL — ABNORMAL LOW (ref 8.4–10.5)
Creatinine, Ser: 2.44 mg/dL — ABNORMAL HIGH (ref 0.50–1.35)
GFR calc Af Amer: 28 mL/min — ABNORMAL LOW (ref >90)
GFR calc non Af Amer: 24 mL/min — ABNORMAL LOW (ref >90)
GLUCOSE: 204 mg/dL — AB (ref 70–99)
POTASSIUM: 3.5 meq/L — AB (ref 3.7–5.3)
Phosphorus: 2 mg/dL — ABNORMAL LOW (ref 2.3–4.6)
Sodium: 143 mEq/L (ref 137–147)

## 2013-09-22 LAB — GLUCOSE, CAPILLARY
GLUCOSE-CAPILLARY: 149 mg/dL — AB (ref 70–99)
GLUCOSE-CAPILLARY: 193 mg/dL — AB (ref 70–99)
GLUCOSE-CAPILLARY: 165 mg/dL — AB (ref 70–99)
Glucose-Capillary: 157 mg/dL — ABNORMAL HIGH (ref 70–99)

## 2013-09-22 NOTE — Progress Notes (Signed)
[facility] Therapy Treatment Record Phone [phone]   Radiation Therapy was administered to Samuel Garner on: 09/22/2013  5:18 PM and was treatment # 1 out of a planned course of 10 treatments.

## 2013-09-22 NOTE — Progress Notes (Addendum)
  [facility] Progress Note   Subjective: No complaints,   Filed Vitals:   09/21/13 1400 09/21/13 2140 09/22/13 0435 09/22/13 1313  BP: 140/60 139/54 147/61 138/56  Pulse: 86 85 80 83  Temp: 99 F (37.2 C) 99.2 F (37.3 C) 98.1 F (36.7 C) 98.1 F (36.7 C)  TempSrc: Oral Oral Oral Oral  Resp: 18 18 18 20   Height:      Weight:      SpO2:  94% 94% 95%   Exam Gen:WD obese, WM, NAD; awake and alert; up in the chair  CVS:RRR no rub  Resp:Anteriorly clear  UMP:NTIRWERXV, +BS, not tender  Ext:1+ edema bilat LE's  Assessment/Plan:  1. AKI due to obstruction / bilat PCN 09/18/13: creat down to 2.44, baseline in March 2015 was 2.5.  No further recommendations, will sign off.  2. Prostate cancer, locally advanced, poorly diff w small cell features: not a candidate for aggressive Rx, palliative XRT planned for now epr oncology 3. DM 4. HTN 5. Anemia, s/p transfusion  Kelly Splinter MD pager [phone]    cell [phone] 09/22/2013, 4:52 PM   Recent Labs Lab 09/20/13 0340 09/21/13 0418 09/22/13 0446  NA 146 145 143  K 3.7 3.2* 3.5*  CL 101 102 103  CO2 27 28 27   GLUCOSE 182* 201* 204*  BUN 64* 47* 33*  CREATININE 6.77* 3.85* 2.44*  CALCIUM 8.3* 8.1* 8.3*  PHOS 4.5 2.9 2.0*    Recent Labs Lab 09/17/13 1500  09/18/13 0412  09/20/13 0340 09/21/13 0418 09/22/13 0446  AST 10  --  9  --   --   --   --   ALT 7  --  6  --   --   --   --   ALKPHOS 58  --  46  --   --   --   --   BILITOT 0.3  --  0.2*  --   --   --   --   PROT 6.8  --  5.6*  --   --   --   --   ALBUMIN 2.7*  < > 2.3*  < > 2.2* 2.1* 2.1*  < > = values in this interval not displayed.  Recent Labs Lab 09/20/13 0340 09/21/13 0418 09/22/13 0446  WBC 9.8 8.7 7.1  HGB 7.0* 6.6* 7.6*  HCT 20.9* 20.4* 23.9*  MCV 87.1 88.7 90.5  PLT 236 263 265   . calcitRIOL  0.25 mcg Oral Q M,W,F  . cholecalciferol  1,000 Units Oral q morning - 10a  . feeding supplement (NEPRO CARB STEADY)  237 mL Oral  Q24H  . fenofibrate  54 mg Oral Daily  . ferumoxytol  510 mg Intravenous Q3 days  . insulin aspart  0-15 Units Subcutaneous TID WC  . insulin aspart  0-5 Units Subcutaneous QHS  . metoprolol  100 mg Oral BID  . niacin  500 mg Oral QHS  . simvastatin  40 mg Oral q1800   . sodium chloride 10 mL/hr at 09/20/13 2206   acetaminophen, acetaminophen, HYDROcodone-acetaminophen, morphine injection, ondansetron (ZOFRAN) IV, ondansetron, opium-belladonna

## 2013-09-22 NOTE — Progress Notes (Signed)
39M Admitted on 09/17/13 for gross hematuria, acute on chronic renal failure, anemia, and hyperkalemia.  Patient recently diagnosed with aggressive prostate cancer and will be started on a course of external beam radiation for control of local symptoms - bleeding.  Oncology will re-evaluate once creatinine nadirs.  Interval: Transfused 1 unit of PRBC, creatinine continues to trend down. Feels better, no issues overnight Urine draining preferentially from neph tubes - 50cc from foley in 24hrs, grossly bloody.  Filed Vitals:   09/21/13 1325 09/21/13 1400 09/21/13 2140 09/22/13 0435  BP: 142/56 140/60 139/54 147/61  Pulse: 82 86 85 80  Temp: 99.3 F (37.4 C) 99 F (37.2 C) 99.2 F (37.3 C) 98.1 F (36.7 C)  TempSrc: Oral Oral Oral Oral  Resp: 20 18 18 18   Height:      Weight:      SpO2:   94% 94%   NAD Abdomen is soft No CVA tenderness neph tubes draining blood tinged urine Foley catheter draining dark thick blood   Recent Labs  09/20/13 0340 09/21/13 0418 09/22/13 0446  WBC 9.8 8.7 7.1  HGB 7.0* 6.6* 7.6*  HCT 20.9* 20.4* 23.9*    Recent Labs  09/20/13 0340 09/21/13 0418 09/22/13 0446  NA 146 145 143  K 3.7 3.2* 3.5*  CL 101 102 103  CO2 27 28 27   GLUCOSE 182* 201* 204*  BUN 64* 47* 33*  CREATININE 6.77* 3.85* 2.44*  CALCIUM 8.3* 8.1* 8.3*   No results found for this basename: LABPT, INR,  in the last 72 hours No results found for this basename: LABURIN,  in the last 72 hours Results for orders placed during the hospital encounter of 09/17/13  URINE CULTURE     Status: None   Collection Time    09/17/13  4:44 PM      Result Value Range Status   Specimen Description URINE, CATHETERIZED   Final   Special Requests NONE   Final   Culture  Setup Time     Final   Value: 09/17/2013 21:43     Performed at [facility]     Final   Value: NO GROWTH     Performed at [facility]   Culture     Final   Value: NO GROWTH     Performed  at [facility]   Report Status 09/18/2013 FINAL   Final   Imp: Aggressive CaP resulting in gross hematuria - still requires catheter, if for nothing else it is helping with hemostasis. Anemia secondary to acute blood loss and chronic disease Acute on chronic renal failure - likely component of pre-renal from reduced hemoglobin and obstructive from prostate DM/CAD/PVD, but is improving.  Recommendations: Continue to keep tubes open to gravity drainage. Will continue to follow.

## 2013-09-22 NOTE — Evaluation (Signed)
Occupational Therapy Evaluation Patient Details Name: Samuel Garner MRN: [id number] DOB: 1932-12-04 Today's Date: 09/22/2013 Time: 1152-1208 OT Time Calculation (min): 16 min  OT Assessment / Plan / Recommendation History of present illness 34M Admitted on 09/17/13 for gross hematuria, acute on chronic renal failure, anemia, and hyperkalemia.  Patient recently diagnosed with aggressive prostate cancer and is awaiting any recommendations of treatment from multi-disciplinary prostate cancer clinic, held Friday 09/19/13.   Clinical Impression   Pt currently at min assist level with functional mobility and limited by tubes/drains for LB self care. He states wife can assist with LB tasks at home. He will benefit from skilled OT services to maximize ADL independence for d/c home with wife.    OT Assessment  Patient needs continued OT Services    Follow Up Recommendations  No OT follow up;Supervision/Assistance - 24 hour    Barriers to Discharge      Equipment Recommendations  None recommended by OT    Recommendations for Other Services    Frequency  Min 2X/week    Precautions / Restrictions Precautions Precaution Comments: bil nephrostomy tubes, Restrictions Weight Bearing Restrictions: No   Pertinent Vitals/Pain No complaint of    ADL  Grooming: Performed;Wash/dry hands;Minimal assistance (to safely manuever walker at sink) Where Assessed - Grooming: Unsupported standing Upper Body Bathing: Simulated;Chest;Right arm;Left arm;Abdomen;Minimal assistance (for drains) Where Assessed - Upper Body Bathing: Unsupported sitting Lower Body Bathing: Simulated;Moderate assistance Where Assessed - Lower Body Bathing: Supported sit to stand Upper Body Dressing: Simulated;Minimal assistance (due to drains) Where Assessed - Upper Body Dressing: Unsupported sitting Lower Body Dressing: Simulated;Moderate assistance (multiple lines/tubes) Where Assessed - Lower Body Dressing: Supported sit to  stand Toilet Transfer: Performed;Minimal Manufacturing systems engineer: Comfort height toilet;Grab bars Toileting - Water quality scientist and Hygiene: Simulated;Minimal assistance Where Assessed - Best boy and Hygiene: Sit to stand from 3-in-1 or toilet Equipment Used: Rolling walker ADL Comments: Discussed use of 3in1 they already own for safety as pt has a higher toilet with vanity on one side but when simulated here with one side support, pt needed min assist to safely descend and stand from toilet. Pt open to using 3in1 at home. He states his wife can hep with LB self care until he is able. Not interested in AE. Pt states wife heped him walker earlier today.    OT Diagnosis: Generalized weakness  OT Problem List: Decreased strength;Decreased knowledge of use of DME or AE OT Treatment Interventions: Self-care/ADL training;DME and/or AE instruction;Therapeutic activities   OT Goals(Current goals can be found in the care plan section) Acute Rehab OT Goals Patient Stated Goal: home when able OT Goal Formulation: With patient/family Time For Goal Achievement: 10/06/13 Potential to Achieve Goals: Good  Visit Information  Last OT Received On: 09/22/13 Assistance Needed: +1 History of Present Illness: 34M Admitted on 09/17/13 for gross hematuria, acute on chronic renal failure, anemia, and hyperkalemia.  Patient recently diagnosed with aggressive prostate cancer and is awaiting any recommendations of treatment from multi-disciplinary prostate cancer clinic, held Friday 09/19/13.       Prior [place] expects to be discharged to:: Private residence Living Arrangements: Spouse/significant other Available Help at Discharge: Family Type of Home: House Home Access: Stairs to enter [employer] of Steps: 2-3 Home Layout: One Port Wing: Environmental consultant - 2 wheels;Shower seat;Bedside commode Prior Function Level of  Independence: Independent Communication Communication: No difficulties         Vision/Perception  Cognition  Cognition Arousal/Alertness: Awake/alert Behavior During Therapy: WFL for tasks assessed/performed Overall Cognitive Status: Within Functional Limits for tasks assessed    Extremity/Trunk Assessment Upper Extremity Assessment Upper Extremity Assessment: Overall WFL for tasks assessed     Mobility Transfers Overall transfer level: Needs assistance Equipment used: Rolling walker (2 wheeled) Transfers: Sit to/from Stand Sit to Stand: Min assist General transfer comment: verbal cues hand placement and min assist for safely transitioning     Exercise     Balance     End of Session OT - End of Session Equipment Utilized During Treatment: Rolling walker Activity Tolerance: Patient tolerated treatment well Patient left: in chair;with call bell/phone within reach;with family/visitor present  GO     Jules Schick [phone] 09/22/2013, 1:03 PM

## 2013-09-22 NOTE — Progress Notes (Signed)
Physical Therapy Treatment Patient Details Name: CORTAVIOUS NIX MRN: [id number] DOB: Nov 04, 1932 Today's Date: 09/22/2013 Time: 8588-5027 PT Time Calculation (min): 14 min  PT Assessment / Plan / Recommendation  History of Present Illness 12M Admitted on 09/17/13 for gross hematuria, acute on chronic renal failure, anemia, and hyperkalemia.  Patient recently diagnosed with aggressive prostate cancer and is awaiting any recommendations of treatment from multi-disciplinary prostate cancer clinic, held Friday 09/19/13.   PT Comments   Progressing with mobility. Pt has been ambulating without assistance however he still needs Min assist for sit>stand and bed mobility.   Follow Up Recommendations  No PT follow up     Does the patient have the potential to tolerate intense rehabilitation     Barriers to Discharge        Equipment Recommendations  None recommended by PT    Recommendations for Other Services    Frequency Min 3X/week   Progress towards PT Goals Progress towards PT goals: Progressing toward goals  Plan Current plan remains appropriate    Precautions / Restrictions Precautions Precaution Comments: bil nephrostomy tubes, Restrictions Weight Bearing Restrictions: No   Pertinent Vitals/Pain No c/o pain    Mobility  Bed Mobility Bed Mobility: Sit to Supine Sit to supine: Min assist General bed mobility comments: Assist for line/tubes and LEs.  Transfers Overall transfer level: Needs assistance Equipment used: Rolling walker (2 wheeled) Transfers: Sit to/from Stand Sit to Stand: Min assist General transfer comment: Assist to rise from bed. VCS safety, hand placement Ambulation/Gait Ambulation/Gait assistance: Supervision Ambulation Distance (Feet): 500 Feet Assistive device: Rolling walker (2 wheeled) Gait Pattern/deviations: Step-through pattern;Trunk flexed;Decreased stride length General Gait Details: VCs distance from walker.     Exercises     PT Diagnosis:     PT Problem List:   PT Treatment Interventions:     PT Goals (current goals can now be found in the care plan section) Acute Rehab PT Goals Patient Stated Goal: home when able  Visit Information  Last PT Received On: 09/22/13 Assistance Needed: +1 History of Present Illness: 12M Admitted on 09/17/13 for gross hematuria, acute on chronic renal failure, anemia, and hyperkalemia.  Patient recently diagnosed with aggressive prostate cancer and is awaiting any recommendations of treatment from multi-disciplinary prostate cancer clinic, held Friday 09/19/13.    Subjective Data  Patient Stated Goal: home when able   Cognition  Cognition Arousal/Alertness: Awake/alert Behavior During Therapy: WFL for tasks assessed/performed Overall Cognitive Status: Within Functional Limits for tasks assessed    Balance     End of Session PT - End of Session Activity Tolerance: Patient tolerated treatment well Patient left: in bed;with call bell/phone within reach;with family/visitor present   GP     Weston Anna, MPT Pager: [phone]

## 2013-09-22 NOTE — Progress Notes (Signed)
Inpatient Diabetes Program Recommendations  AACE/ADA: New Consensus Statement on Inpatient Glycemic Control (2013)  Target Ranges:  Prepandial:   less than 140 mg/dL      Peak postprandial:   less than 180 mg/dL (1-2 hours)      Critically ill patients:  140 - 180 mg/dL   Reason for Visit: Hyperglycemia  May benefit from addition of small dose long-acting insulin. Recommend Lantus 8 units QHS.  Thank you. Lorenda Peck, RD, LDN, CDE Inpatient Diabetes Coordinator [phone]

## 2013-09-22 NOTE — Progress Notes (Signed)
Visited patient and his family at [facility] 1423 to provide support during admission.  Introduced myself to family as Transport planner of the [facility], explained my role as member of the Care Team.  Patient and family verbalized request that clinic docs see patient in his room; family stated they would arrive by 7:30 tomorrow morning.  I indicated I would relay request.  Gayleen Orem, RN, BSN, [facility] Prostate Oncology Navigator [phone]

## 2013-09-22 NOTE — Progress Notes (Signed)
[facility] PROGRESS NOTE  Samuel Garner HEN:[id number] DOB: 03-07-1933 DOA: 09/17/2013 PCP: Delphina Cahill, MD  Assessment/Plan: 1. Acute renal failure  1. Likely post-obstructive, concerns for upper tract obstruction 2. Cont with IVF as tolerated 3. Nephrology following 4. Cont to avoid nephrotoxic drugs (including ACEI from home med list) 5. Renal fx continues to improve nicely (now below baseline creatine today) s/p nephrostomy tubes  6. Appreciate Nephrology and Urology assistance 2. Hyperkalemia  1. S/p insulin and kayexalate in the ED 2. Low today, will replace 3. DM  1. Cont with SSI as tolerated 4. HTN  1. Stable 2. Cont regimen 5. Hx gout  1. Hold allopurinol given ARF 6. Hematuria  1. S/p foley cath 2. Urology following 7. Anemia  1. Cont to follow CBC 2. Hgb <7 this AM, given 1 unit PRBC 3. Likely related to hematuria 8. Prostate cancer 1. Oncology notes reviewed. Overall poor prognosis 2. Not a candidate for surgery 3. For radiation tx starting on 09/22/13 9. DVT prophylaxis  1. SCD's for prophylaxis  Code Status: Full Family Communication: Pt in room (indicate person spoken with, relationship, and if by phone, the number) Disposition Plan: Pending  Consultants:  Nephrology  Urology  IR  Procedures:  CBI per Urology  Nephrostomy tubes per IR 09/18/13  HPI/Subjective: No acute events noted overnight. No complaints Reports feeling better  Objective: Filed Vitals:   09/21/13 1325 09/21/13 1400 09/21/13 2140 09/22/13 0435  BP: 142/56 140/60 139/54 147/61  Pulse: 82 86 85 80  Temp: 99.3 F (37.4 C) 99 F (37.2 C) 99.2 F (37.3 C) 98.1 F (36.7 C)  TempSrc: Oral Oral Oral Oral  Resp: 20 18 18 18   Height:      Weight:      SpO2:   94% 94%    Intake/Output Summary (Last 24 hours) at 09/22/13 1040 Last data filed at 09/22/13 3614  Gross per 24 hour  Intake    872 ml  Output   1526 ml  Net   -654 ml   Filed Weights   09/17/13 2026   Weight: 114.5 kg (252 lb 6.8 oz)    Exam:   General:  Awake, in nad  Cardiovascular: regular, s1, s2  Respiratory: normal resp effort, no wheezing  Abdomen: soft, nondistended  Musculoskeletal: perfused, no clubbing   Data Reviewed: Basic Metabolic Panel:  Recent Labs Lab 09/18/13 1415 09/19/13 0500 09/20/13 0340 09/21/13 0418 09/22/13 0446  NA 143 142 146 145 143  K 5.7* 4.2 3.7 3.2* 3.5*  CL 101 100 101 102 103  CO2 19 20 27 28 27   GLUCOSE 166* 181* 182* 201* 204*  BUN 90* 79* 64* 47* 33*  CREATININE 12.60* 9.69* 6.77* 3.85* 2.44*  CALCIUM 8.8 8.6 8.3* 8.1* 8.3*  PHOS 7.8* 6.2* 4.5 2.9 2.0*   Liver Function Tests:  Recent Labs Lab 09/17/13 1500  09/18/13 0412 09/18/13 1415 09/19/13 0500 09/20/13 0340 09/21/13 0418 09/22/13 0446  AST 10  --  9  --   --   --   --   --   ALT 7  --  6  --   --   --   --   --   ALKPHOS 58  --  46  --   --   --   --   --   BILITOT 0.3  --  0.2*  --   --   --   --   --   PROT 6.8  --  5.6*  --   --   --   --   --   ALBUMIN 2.7*  < > 2.3* 2.3* 2.3* 2.2* 2.1* 2.1*  < > = values in this interval not displayed. No results found for this basename: LIPASE, AMYLASE,  in the last 168 hours No results found for this basename: AMMONIA,  in the last 168 hours CBC:  Recent Labs Lab 09/18/13 0412 09/19/13 0500 09/20/13 0340 09/21/13 0418 09/22/13 0446  WBC 10.1 11.9* 9.8 8.7 7.1  HGB 6.1* 7.3* 7.0* 6.6* 7.6*  HCT 18.0* 21.4* 20.9* 20.4* 23.9*  MCV 86.1 85.6 87.1 88.7 90.5  PLT 255 253 236 263 265   Cardiac Enzymes: No results found for this basename: CKTOTAL, CKMB, CKMBINDEX, TROPONINI,  in the last 168 hours BNP (last 3 results)  Recent Labs  09/01/13 0513  PROBNP 2536.0*   CBG:  Recent Labs Lab 09/21/13 0745 09/21/13 1230 09/21/13 1634 09/21/13 2137 09/22/13 0741  GLUCAP 166* 165* 182* 163* 157*    Recent Results (from the past 240 hour(s))  URINE CULTURE     Status: None   Collection Time    09/17/13   4:44 PM      Result Value Range Status   Specimen Description URINE, CATHETERIZED   Final   Special Requests NONE   Final   Culture  Setup Time     Final   Value: 09/17/2013 21:43     Performed at [place]     Final   Value: NO GROWTH     Performed at [facility]   Culture     Final   Value: NO GROWTH     Performed at [facility]   Report Status 09/18/2013 FINAL   Final     Studies: No results found.  Scheduled Meds: . calcitRIOL  0.25 mcg Oral Q M,W,F  . cholecalciferol  1,000 Units Oral q morning - 10a  . feeding supplement (NEPRO CARB STEADY)  237 mL Oral Q24H  . fenofibrate  54 mg Oral Daily  . ferumoxytol  510 mg Intravenous Q3 days  . insulin aspart  0-15 Units Subcutaneous TID WC  . insulin aspart  0-5 Units Subcutaneous QHS  . metoprolol  100 mg Oral BID  . niacin  500 mg Oral QHS  . simvastatin  40 mg Oral q1800   Continuous Infusions: . sodium chloride 10 mL/hr at 09/20/13 2206    Principal Problem:   Obstructive uropathy Active Problems:   Diabetes mellitus, type 2   Hypertension   Chronic kidney disease   Gross hematuria   ARF (acute renal failure)  Time spent: 34min  Dequandre Cordova, [place] [facility] Pager [phone]. If 7PM-7AM, please contact night-coverage at [email or web address], password [facility] 09/22/2013, 10:40 AM  LOS: 5 days

## 2013-09-22 NOTE — Progress Notes (Signed)
  Subjective: Pt sitting up in chair; states he feels better since neph tubes placed; denies sig flank pain; still with hematuria  Objective: Vital signs in last 24 hours: Temp:  [98.1 F (36.7 C)-99.2 F (37.3 C)] 98.1 F (36.7 C) (01/12 1313) Pulse Rate:  [80-85] 83 (01/12 1313) Resp:  [18-20] 20 (01/12 1313) BP: (138-147)/(54-61) 138/56 mmHg (01/12 1313) SpO2:  [94 %-95 %] 95 % (01/12 1313) Last BM Date: 09/21/13  Intake/Output from previous day: 01/11 0701 - 01/12 0700 In: 852 [P.O.:540; Blood:312] Out: 1826 [Urine:1825; Stool:1] Intake/Output this shift: Total I/O In: 640 [P.O.:640] Out: 400 [Urine:400]  bilat PCN's intact, dressings dry,NT, outputs R/L- 250/150 cc's respect. Blood-tinged urine with few clot strands  Lab Results:   Recent Labs  09/21/13 0418 09/22/13 0446  WBC 8.7 7.1  HGB 6.6* 7.6*  HCT 20.4* 23.9*  PLT 263 265   BMET  Recent Labs  09/21/13 0418 09/22/13 0446  NA 145 143  K 3.2* 3.5*  CL 102 103  CO2 28 27  GLUCOSE 201* 204*  BUN 47* 33*  CREATININE 3.85* 2.44*  CALCIUM 8.1* 8.3*   PT/INR No results found for this basename: LABPROT, INR,  in the last 72 hours ABG No results found for this basename: PHART, PCO2, PO2, HCO3,  in the last 72 hours  Studies/Results: No results found.  Anti-infectives: Anti-infectives   Start     Dose/Rate Route Frequency Ordered Stop   09/18/13 1030  ceFAZolin (ANCEF) IVPB 2 g/50 mL premix    Comments:  On call to IR for procedure 09/18/13   2 g 100 mL/hr over 30 Minutes Intravenous  Once 09/18/13 1010 09/18/13 1130      Assessment/Plan: s/p bilat PCN's 1/8; cont current tx, monitor labs; other plans as per urology/neph/IM  LOS: 5 days    ALLRED,D [facility] 09/22/2013

## 2013-09-22 NOTE — Progress Notes (Signed)
Spoke with patient wife and grandson in [facility] to provide support, answer questions while they were waiting as patient being simulated for start of RT next Monday.    Gayleen Orem, RN, BSN, [facility] Prostate Oncology Navigator [phone]

## 2013-09-23 ENCOUNTER — Encounter: Payer: Self-pay | Admitting: Radiation Oncology

## 2013-09-23 ENCOUNTER — Ambulatory Visit
Admit: 2013-09-23 | Discharge: 2013-09-23 | Disposition: A | Discharge status: Home / Self Care | Payer: Medicare Other | Attending: Radiation Oncology | Admitting: Radiation Oncology

## 2013-09-23 ENCOUNTER — Encounter: Payer: Self-pay | Admitting: *Deleted

## 2013-09-23 DIAGNOSIS — R319 Hematuria, unspecified: Secondary | ICD-10-CM

## 2013-09-23 LAB — GLUCOSE, CAPILLARY
GLUCOSE-CAPILLARY: 153 mg/dL — AB (ref 70–99)
GLUCOSE-CAPILLARY: 151 mg/dL — AB (ref 70–99)
Glucose-Capillary: 162 mg/dL — ABNORMAL HIGH (ref 70–99)
Glucose-Capillary: 180 mg/dL — ABNORMAL HIGH (ref 70–99)

## 2013-09-23 LAB — CBC
HCT: 23.6 % — ABNORMAL LOW (ref 39.0–52.0)
Hemoglobin: 7.5 g/dL — ABNORMAL LOW (ref 13.0–17.0)
MCH: 28.7 pg (ref 26.0–34.0)
MCHC: 31.8 g/dL (ref 30.0–36.0)
MCV: 90.4 fL (ref 78.0–100.0)
PLATELETS: 270 10*3/uL (ref 150–400)
RBC: 2.61 MIL/uL — ABNORMAL LOW (ref 4.22–5.81)
RDW: 14.8 % (ref 11.5–15.5)
WBC: 8.2 10*3/uL (ref 4.0–10.5)

## 2013-09-23 LAB — RENAL FUNCTION PANEL
ALBUMIN: 2.1 g/dL — AB (ref 3.5–5.2)
BUN: 27 mg/dL — ABNORMAL HIGH (ref 6–23)
CALCIUM: 8.4 mg/dL (ref 8.4–10.5)
CO2: 27 mEq/L (ref 19–32)
Chloride: 104 mEq/L (ref 96–112)
Creatinine, Ser: 2.06 mg/dL — ABNORMAL HIGH (ref 0.50–1.35)
GFR calc Af Amer: 34 mL/min — ABNORMAL LOW (ref >90)
GFR, EST NON AFRICAN AMERICAN: 29 mL/min — AB (ref >90)
Glucose, Bld: 220 mg/dL — ABNORMAL HIGH (ref 70–99)
Phosphorus: 1.4 mg/dL — ABNORMAL LOW (ref 2.3–4.6)
Potassium: 3.5 mEq/L — ABNORMAL LOW (ref 3.7–5.3)
Sodium: 145 mEq/L (ref 137–147)

## 2013-09-23 NOTE — Progress Notes (Signed)
57M Admitted on 09/17/13 for gross hematuria, acute on chronic renal failure, anemia, and hyperkalemia.  Patient recently diagnosed with aggressive prostate cancer and will be started on a course of external beam radiation for control of local symptoms - bleeding.  Oncology will re-evaluate once creatinine nadirs.  Interval: First radiation treatment yesterday - well tolerated. Creatinine continues to trend down - hgb stable Renal has signed off No issues overnight Urine draining preferentially from neph tubes - 0cc from foley in 24hrs Up and walking, working with PT  Filed Vitals:   09/22/13 0435 09/22/13 1313 09/22/13 2036 09/23/13 0430  BP: 147/61 138/56 137/63 151/68  Pulse: 80 83 89 89  Temp: 98.1 F (36.7 C) 98.1 F (36.7 C) 98.2 F (36.8 C) 98.2 F (36.8 C)  TempSrc: Oral Oral Oral Oral  Resp: 18 20 18 18   Height:      Weight:    106.5 kg (234 lb 12.6 oz)  SpO2: 94% 95% 95% 99%   NAD Abdomen is soft neph tubes draining blood tinged urine - lighter today Foley catheter does not appear to be draining   Recent Labs  09/21/13 0418 09/22/13 0446 09/23/13 0418  WBC 8.7 7.1 8.2  HGB 6.6* 7.6* 7.5*  HCT 20.4* 23.9* 23.6*    Recent Labs  09/21/13 0418 09/22/13 0446 09/23/13 0418  NA 145 143 145  K 3.2* 3.5* 3.5*  CL 102 103 104  CO2 28 27 27   GLUCOSE 201* 204* 220*  BUN 47* 33* 27*  CREATININE 3.85* 2.44* 2.06*  CALCIUM 8.1* 8.3* 8.4   No results found for this basename: LABPT, INR,  in the last 72 hours No results found for this basename: LABURIN,  in the last 72 hours Results for orders placed during the hospital encounter of 09/17/13  URINE CULTURE     Status: None   Collection Time    09/17/13  4:44 PM      Result Value Range Status   Specimen Description URINE, CATHETERIZED   Final   Special Requests NONE   Final   Culture  Setup Time     Final   Value: 09/17/2013 21:43     Performed at [place]     Final   Value: NO  GROWTH     Performed at [facility]   Culture     Final   Value: NO GROWTH     Performed at [facility]   Report Status 09/18/2013 FINAL   Final   Imp: Aggressive CaP resulting in gross hematuria - still requires catheter, if for nothing else it is helping with hemostasis. Anemia secondary to acute blood loss and chronic disease Acute on chronic renal failure - likely component of pre-renal from reduced hemoglobin and obstructive from prostate DM/CAD/PVD, but is improving.  Recommendations: Considering removing foley tomorrow - currently it has helped with hemostasis Neph tubes open to gravity. Will continue to follow.

## 2013-09-23 NOTE — Progress Notes (Signed)
[place] [facility] Dept Therapy Treatment Record Phone [phone]   Radiation Therapy was administered to Samuel Garner on: 09/23/2013  2:21 PM and was treatment # 2 out of a planned course of 10  treatments.

## 2013-09-23 NOTE — Progress Notes (Signed)
Subjective: Pt sitting up in chair. Feels pretty good.   Objective: Physical Exam: BP 151/68  Pulse 89  Temp(Src) 98.2 F (36.8 C) (Oral)  Resp 18  Ht 5\' 9"  (1.753 m)  Wt 234 lb 12.6 oz (106.5 kg)  BMI 34.66 kg/m2  SpO2 99% (L)PCN intact, site clean. Mostly clear urine output with small amt of bloody standing. 623mL recorded output yesterday, bag more than half full now. (R)PCN intact, site clean, dry. Output is now essentially clear urine. 925cc recorded yesterday, bag half full now.    Labs: CBC  Recent Labs  09/22/13 0446 09/23/13 0418  WBC 7.1 8.2  HGB 7.6* 7.5*  HCT 23.9* 23.6*  PLT 265 270   BMET  Recent Labs  09/22/13 0446 09/23/13 0418  NA 143 145  K 3.5* 3.5*  CL 103 104  CO2 27 27  GLUCOSE 204* 220*  BUN 33* 27*  CREATININE 2.44* 2.06*  CALCIUM 8.3* 8.4   LFT  Recent Labs  09/23/13 0418  ALBUMIN 2.1*   PT/INR No results found for this basename: LABPROT, INR,  in the last 72 hours   Studies/Results: No results found.  Assessment/Plan: (B)hydronephrosis from obstructing prostate cancer Acute on chronic renal failure. S/p (B)PCN placement. Good UOP from both catheters, hematuria from [facility] is resolving. Cr down to 2.0, renal has signed off.    LOS: 6 days    Ascencion Dike PA-C 09/23/2013 11:02 AM

## 2013-09-23 NOTE — Progress Notes (Signed)
Visited with patient and his wife and grandson at [facility]23 to provide support and encouragement.  Patient stated he was feeling much better, kidney function now "better then it was before I came in", has been ambulating in his room and in the halls.  He/family indicated today's tmt is 2 of 10.  Gayleen Orem, RN, BSN, [facility] Prostate Oncology Navigator [phone]

## 2013-09-23 NOTE — Progress Notes (Signed)
  Radiation Oncology         ([phone]) [phone] ________________________________  Name: Samuel Garner MRN: [id number]  Date: 09/22/2013  DOB: 12-09-1932  Simulation Verification Note  Status: outpatient  NARRATIVE: The patient was brought to the treatment unit and placed in the planned treatment position. The clinical setup was verified. Then port films were obtained and uploaded to the radiation oncology medical record software.  The treatment beams were carefully compared against the planned radiation fields. The position location and shape of the radiation fields was reviewed. They targeted volume of tissue appears to be appropriately covered by the radiation beams. Organs at risk appear to be excluded as planned.  Based on my personal review, I approved the simulation verification. The patient's treatment will proceed as planned.  ------------------------------------------------  Sheral Apley Tammi Klippel, M.D.

## 2013-09-23 NOTE — Progress Notes (Signed)
[facility] PROGRESS NOTE  Samuel Garner VHQ:[id number] DOB: April 03, 1933 DOA: 09/17/2013 PCP: Delphina Cahill, MD  Assessment/Plan: 1. Acute renal failure  1. Likely post-obstructive, concerns for upper tract obstruction 2. Cont with IVF as tolerated 3. Nephrology has signed off as renal function has improved markedly with nephrostomy tubes 4. Cont to avoid nephrotoxic drugs 5. Appreciate Nephrology and Urology assistance 2. Hyperkalemia  1. S/p insulin and kayexalate in the ED 2. stable 3. DM  1. Cont with SSI as tolerated 4. HTN  1. Stable 2. Cont regimen 5. Hx gout  1. Hold allopurinol given ARF 6. Hematuria  1. S/p foley cath placement 2. Urology following 3. Poss trial of removing foley tomorrow per recs 7. Anemia  1. Cont to follow CBC 2. Hgb s/p PRBC transfusion 3. Likely related to hematuria 8. Prostate cancer 1. Oncology notes reviewed. Overall poor prognosis 2. Not a candidate for surgery 3. Started Radiation tx on 09/22/13 9. DVT prophylaxis  1. SCD's for prophylaxis  Code Status: Full Family Communication: Pt in room (indicate person spoken with, relationship, and if by phone, the number) Disposition Plan: Pending  Consultants:  Nephrology  Urology  IR  Procedures:  CBI per Urology  Nephrostomy tubes per IR 09/18/13  HPI/Subjective: No acute events noted overnight. Feels well  Objective: Filed Vitals:   09/22/13 0435 09/22/13 1313 09/22/13 2036 09/23/13 0430  BP: 147/61 138/56 137/63 151/68  Pulse: 80 83 89 89  Temp: 98.1 F (36.7 C) 98.1 F (36.7 C) 98.2 F (36.8 C) 98.2 F (36.8 C)  TempSrc: Oral Oral Oral Oral  Resp: 18 20 18 18   Height:      Weight:    106.5 kg (234 lb 12.6 oz)  SpO2: 94% 95% 95% 99%    Intake/Output Summary (Last 24 hours) at 09/23/13 0915 Last data filed at 09/23/13 0756  Gross per 24 hour  Intake   1140 ml  Output   1575 ml  Net   -435 ml   Filed Weights   09/17/13 2026 09/23/13 0430  Weight: 114.5 kg  (252 lb 6.8 oz) 106.5 kg (234 lb 12.6 oz)    Exam:   General:  Awake, in nad  Cardiovascular: regular, s1, s2  Respiratory: normal resp effort, no wheezing  Abdomen: soft, nondistended  Musculoskeletal: perfused, no clubbing   Data Reviewed: Basic Metabolic Panel:  Recent Labs Lab 09/19/13 0500 09/20/13 0340 09/21/13 0418 09/22/13 0446 09/23/13 0418  NA 142 146 145 143 145  K 4.2 3.7 3.2* 3.5* 3.5*  CL 100 101 102 103 104  CO2 20 27 28 27 27   GLUCOSE 181* 182* 201* 204* 220*  BUN 79* 64* 47* 33* 27*  CREATININE 9.69* 6.77* 3.85* 2.44* 2.06*  CALCIUM 8.6 8.3* 8.1* 8.3* 8.4  PHOS 6.2* 4.5 2.9 2.0* 1.4*   Liver Function Tests:  Recent Labs Lab 09/17/13 1500  09/18/13 0412  09/19/13 0500 09/20/13 0340 09/21/13 0418 09/22/13 0446 09/23/13 0418  AST 10  --  9  --   --   --   --   --   --   ALT 7  --  6  --   --   --   --   --   --   ALKPHOS 58  --  46  --   --   --   --   --   --   BILITOT 0.3  --  0.2*  --   --   --   --   --   --  PROT 6.8  --  5.6*  --   --   --   --   --   --   ALBUMIN 2.7*  < > 2.3*  < > 2.3* 2.2* 2.1* 2.1* 2.1*  < > = values in this interval not displayed. No results found for this basename: LIPASE, AMYLASE,  in the last 168 hours No results found for this basename: AMMONIA,  in the last 168 hours CBC:  Recent Labs Lab 09/19/13 0500 09/20/13 0340 09/21/13 0418 09/22/13 0446 09/23/13 0418  WBC 11.9* 9.8 8.7 7.1 8.2  HGB 7.3* 7.0* 6.6* 7.6* 7.5*  HCT 21.4* 20.9* 20.4* 23.9* 23.6*  MCV 85.6 87.1 88.7 90.5 90.4  PLT 253 236 263 265 270   Cardiac Enzymes: No results found for this basename: CKTOTAL, CKMB, CKMBINDEX, TROPONINI,  in the last 168 hours BNP (last 3 results)  Recent Labs  09/01/13 0513  PROBNP 2536.0*   CBG:  Recent Labs Lab 09/22/13 0741 09/22/13 1139 09/22/13 1806 09/22/13 2207 09/23/13 0732  GLUCAP 157* 149* 193* 165* 162*    Recent Results (from the past 240 hour(s))  URINE CULTURE     Status:  None   Collection Time    09/17/13  4:44 PM      Result Value Range Status   Specimen Description URINE, CATHETERIZED   Final   Special Requests NONE   Final   Culture  Setup Time     Final   Value: 09/17/2013 21:43     Performed at [place]     Final   Value: NO GROWTH     Performed at [facility]   Culture     Final   Value: NO GROWTH     Performed at [facility]   Report Status 09/18/2013 FINAL   Final     Studies: No results found.  Scheduled Meds: . calcitRIOL  0.25 mcg Oral Q M,W,F  . cholecalciferol  1,000 Units Oral q morning - 10a  . feeding supplement (NEPRO CARB STEADY)  237 mL Oral Q24H  . fenofibrate  54 mg Oral Daily  . ferumoxytol  510 mg Intravenous Q3 days  . insulin aspart  0-15 Units Subcutaneous TID WC  . insulin aspart  0-5 Units Subcutaneous QHS  . metoprolol  100 mg Oral BID  . niacin  500 mg Oral QHS  . simvastatin  40 mg Oral q1800   Continuous Infusions: . sodium chloride 10 mL/hr at 09/20/13 2206    Principal Problem:   Obstructive uropathy Active Problems:   Diabetes mellitus, type 2   Hypertension   Chronic kidney disease   Gross hematuria   ARF (acute renal failure)  Time spent: 85min  Ashlinn Hemrick, [place] [facility] Pager [phone]. If 7PM-7AM, please contact night-coverage at [email or web address], password [facility] 09/23/2013, 9:15 AM  LOS: 6 days

## 2013-09-24 ENCOUNTER — Ambulatory Visit
Admit: 2013-09-24 | Discharge: 2013-09-24 | Disposition: A | Discharge status: Home / Self Care | Payer: Medicare Other | Attending: Radiation Oncology | Admitting: Radiation Oncology

## 2013-09-24 ENCOUNTER — Encounter: Payer: Self-pay | Admitting: Radiation Oncology

## 2013-09-24 VITALS — BP 147/53 | HR 72 | Resp 16

## 2013-09-24 DIAGNOSIS — C61 Malignant neoplasm of prostate: Secondary | ICD-10-CM

## 2013-09-24 DIAGNOSIS — N139 Obstructive and reflux uropathy, unspecified: Secondary | ICD-10-CM

## 2013-09-24 LAB — CBC
HCT: 24 % — ABNORMAL LOW (ref 39.0–52.0)
Hemoglobin: 7.5 g/dL — ABNORMAL LOW (ref 13.0–17.0)
MCH: 28.7 pg (ref 26.0–34.0)
MCHC: 31.3 g/dL (ref 30.0–36.0)
MCV: 92 fL (ref 78.0–100.0)
PLATELETS: 246 10*3/uL (ref 150–400)
RBC: 2.61 MIL/uL — ABNORMAL LOW (ref 4.22–5.81)
RDW: 14.8 % (ref 11.5–15.5)
WBC: 7.5 10*3/uL (ref 4.0–10.5)

## 2013-09-24 LAB — RENAL FUNCTION PANEL
ALBUMIN: 2.2 g/dL — AB (ref 3.5–5.2)
BUN: 23 mg/dL (ref 6–23)
CO2: 29 mEq/L (ref 19–32)
Calcium: 8.6 mg/dL (ref 8.4–10.5)
Chloride: 106 mEq/L (ref 96–112)
Creatinine, Ser: 1.85 mg/dL — ABNORMAL HIGH (ref 0.50–1.35)
GFR calc non Af Amer: 33 mL/min — ABNORMAL LOW (ref >90)
GFR, EST AFRICAN AMERICAN: 39 mL/min — AB (ref >90)
Glucose, Bld: 184 mg/dL — ABNORMAL HIGH (ref 70–99)
PHOSPHORUS: 1.6 mg/dL — AB (ref 2.3–4.6)
Potassium: 3.9 mEq/L (ref 3.7–5.3)
SODIUM: 144 meq/L (ref 137–147)

## 2013-09-24 LAB — GLUCOSE, CAPILLARY
Glucose-Capillary: 171 mg/dL — ABNORMAL HIGH (ref 70–99)
Glucose-Capillary: 159 mg/dL — ABNORMAL HIGH (ref 70–99)
Glucose-Capillary: 189 mg/dL — ABNORMAL HIGH (ref 70–99)

## 2013-09-24 MED ORDER — ENSURE PUDDING PO PUDG
1.0000 | ORAL | Status: DC
  Administered 2013-09-24 – 2013-09-26 (×2): 1 via ORAL
  Filled 2013-09-24 (×4): qty 1

## 2013-09-24 NOTE — Progress Notes (Signed)
[facility] PROGRESS NOTE  Samuel Garner [id number] DOB: 1933-02-19 DOA: 09/17/2013 PCP: Delphina Cahill, MD  Assessment/Plan: Acute renal failure  -Obstructive renal failure secondary to aggressive prostate cancer  -Cont with IVF as tolerated, patient's current creatinine= 1.85 -Nephrology signed off the case on 09/22/2013  -Cont to avoid nephrotoxic drugs  Hyperkalemia  -Resolved will continue to monitor   DM  -Continue moderate SSI for control  HTN  -Stable continue current medication regimen  Gout -Hold allopurinol given ARF; patient currently is symptom  Hematuria  -Continued hematuria or Foley catheter, urology is following will await their recommendation   Anemia  -s/p PRBC transfusion -H./H. stable continue to monitor  Prostate cancer  -Oncology notes reviewed. Overall poor prognosis -Not a candidate for surgery -Started Radiation tx on 09/22/13. Will contact radiation oncology and urology to determine when patient stable for discharge     Code Status: Full Family Communication:  Disposition Plan: Per radiation oncology/urology   Consultants: Dr. Renaldo Reel (nephrology) Dr. Louis Meckel (urology) Dr. Tyler Pita (radiation oncology)     Procedures:  placement of bilateral nephrostomy tubes on 09/18/2013 External beam radiation for progressive prostate cancer started 09/22/2013>>    Antibiotics:    HPI/Subjective: 80 y.o. male PMHx CKD secondary to obstructive prostate cancer, DM, HTN,dyslipidemia, type IV renal tubular acidosis, stage III/IV chronic kidney disease and prostate cancer who was recently discharged on 09/02/13. During his recent stay CT scan of abdomen and pelvis performed 08/15/2013 showed a 6.0 x 4.7 cm irregular mass posteriorly and inferiorly in the urinary bladder concerning for neoplasm. He was also found to have an enlarged right external iliac lymph nodes concerning for metastatic disease. Patient was discharged  from the emergency department to follow up with urology as an outpatient. He was seen by Dr. Louis Meckel on 08/29/2013 for obstructive Foley catheter that was replaced. On 08/29/2013 he underwentr cystoscopy with clot back lesion as well as TURP, procedure performed by Dr. Lynann Bologna. Postprocedure he was placed on continuous bladder irrigation. During this hospitalization, his hemoglobin trended down to 7.0 from 10.0 on 08/28/2013, for which he was typed and cross and transfuse with 2 units of packed red blood cells. He was also found to have worsening kidney function, with his creatinine going from 3.73 on 08/28/2013 4.76 on 08/31/2013. He was started on IV fluids, with his creatinine trended down to 3.45 on 09/02/2013 after receiving NS. I suspect acute on chronic renal failure secondary to anemia and volume depletion. His hemoglobin remained stable at 8.0 by 09/02/2013. He was seen by Dr. Louis Meckel who recommended discharging him on a Foley catheter as he failed a voiding trial. Patient was discharged in stable condition on 09/02/2013 followup with urology in the next week.   Patient returned to ED on 09/17/2013  The patient presented for follow up with complaints of recurrent hematuria and urinary incontinence. The patient was noted to have just under 500cc of urine via bladder scan and a foley was placed. Bloodwork demonstrated a K of 6.4 with a Cr of over 11 (baseline of around 3). The patient was given glucose with insulin as well as kayexalate. Per Dr. Jamal Maes (nephrology)  Epic note 09/21/2013, after placement of bilateral nephrostomy tubes on 09/18/2013 patient showed marked improvement in renal function. 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 On 09/22/2013 nephrology signed off case.  Patient received first dose of external beam radiation on 09/22/2013, 09/23/2013  patient received treatment #2. 09/24/2013  patient sitting in chair comfortably, negative abdominal pain negative CVA tenderness. Patient has been advised of plan for XRT for invasive prostate cancer    Objective: Filed Vitals:   09/22/13 2036 09/23/13 0430 09/23/13 2151 09/24/13 0429  BP: 137/63 151/68 142/60 132/60  Pulse: 89 89 89 70  Temp: 98.2 F (36.8 C) 98.2 F (36.8 C) 98.3 F (36.8 C) 98.6 F (37 C)  TempSrc: Oral Oral Oral Oral  Resp: 18 18 20 20   Height:      Weight:  106.5 kg (234 lb 12.6 oz)    SpO2: 95% 99% 97% 94%    Intake/Output Summary (Last 24 hours) at 09/24/13 1105 Last data filed at 09/24/13 6789  Gross per 24 hour  Intake    710 ml  Output   1925 ml  Net  -1215 ml   Filed Weights   09/17/13 2026 09/23/13 0430  Weight: 114.5 kg (252 lb 6.8 oz) 106.5 kg (234 lb 12.6 oz)    Exam:   General:  A./O. x4, NAD  Cardiovascular: Regular rhythm and rate, negative murmurs rubs or gallops, DP/PT pulse +1 bilateral  Respiratory: Clear to auscultation bilateral  Abdomen: Soft, nontender, negative CVA tenderness, bilateral nephrostomy tubes with serosanguineous fluid, negative sign of infection at site of entry, Foley catheter with dark urine.  Musculoskeletal: Negative pedal edema   Data Reviewed: Basic Metabolic Panel:  Recent Labs Lab 09/20/13 0340 09/21/13 0418 09/22/13 0446 09/23/13 0418 09/24/13 0420  NA 146 145 143 145 144  K 3.7 3.2* 3.5* 3.5* 3.9  CL 101 102 103 104 106  CO2 27 28 27 27 29   GLUCOSE 182* 201* 204* 220* 184*  BUN 64* 47* 33* 27* 23  CREATININE 6.77* 3.85* 2.44* 2.06* 1.85*  CALCIUM 8.3* 8.1* 8.3* 8.4 8.6  PHOS 4.5 2.9 2.0* 1.4* 1.6*   Liver Function Tests:  Recent Labs Lab 09/17/13 1500  09/18/13 0412  09/20/13 0340 09/21/13 0418 09/22/13 0446 09/23/13 0418 09/24/13 0420  AST 10  --  9  --   --   --   --   --   --   ALT 7  --  6  --   --   --   --   --   --   ALKPHOS 58  --  46  --   --   --   --   --   --   BILITOT 0.3  --  0.2*  --   --   --   --    --   --   PROT 6.8  --  5.6*  --   --   --   --   --   --   ALBUMIN 2.7*  < > 2.3*  < > 2.2* 2.1* 2.1* 2.1* 2.2*  < > = values in this interval not displayed. No results found for this basename: LIPASE, AMYLASE,  in the last 168 hours No results found for this basename: AMMONIA,  in the last 168 hours CBC:  Recent Labs Lab 09/20/13 0340 09/21/13 0418 09/22/13 0446 09/23/13 0418 09/24/13 0420  WBC 9.8 8.7 7.1 8.2 7.5  HGB 7.0* 6.6* 7.6* 7.5* 7.5*  HCT 20.9* 20.4* 23.9* 23.6* 24.0*  MCV 87.1 88.7 90.5 90.4 92.0  PLT 236 263 265 270 246   Cardiac Enzymes: No results found for this basename: CKTOTAL, CKMB, CKMBINDEX, TROPONINI,  in the last 168 hours BNP (last 3 results)  Recent  Labs  09/01/13 0513  PROBNP 2536.0*   CBG:  Recent Labs Lab 09/23/13 0732 09/23/13 1252 09/23/13 1717 09/23/13 2131 09/24/13 0724  GLUCAP 162* 153* 180* 151* 171*    Recent Results (from the past 240 hour(s))  URINE CULTURE     Status: None   Collection Time    09/17/13  4:44 PM      Result Value Range Status   Specimen Description URINE, CATHETERIZED   Final   Special Requests NONE   Final   Culture  Setup Time     Final   Value: 09/17/2013 21:43     Performed at [facility]     Final   Value: NO GROWTH     Performed at [facility]   Culture     Final   Value: NO GROWTH     Performed at [facility]   Report Status 09/18/2013 FINAL   Final     Studies: No results found.  Scheduled Meds: . calcitRIOL  0.25 mcg Oral Q M,W,F  . cholecalciferol  1,000 Units Oral q morning - 10a  . feeding supplement (NEPRO CARB STEADY)  237 mL Oral Q24H  . fenofibrate  54 mg Oral Daily  . ferumoxytol  510 mg Intravenous Q3 days  . insulin aspart  0-15 Units Subcutaneous TID WC  . insulin aspart  0-5 Units Subcutaneous QHS  . metoprolol  100 mg Oral BID  . niacin  500 mg Oral QHS  . simvastatin  40 mg Oral q1800   Continuous Infusions: . sodium  chloride 10 mL/hr at 09/20/13 2206    Principal Problem:   Obstructive uropathy Active Problems:   Diabetes mellitus, type 2   Hypertension   Chronic kidney disease   Gross hematuria   ARF (acute renal failure)    Time spent: 50 minute    WOODS, CURTIS, J  [facility] Pager [phone]. If 7PM-7AM, please contact night-coverage at [email or web address], password [facility] 09/24/2013, 11:05 AM  LOS: 7 days

## 2013-09-24 NOTE — Therapy (Signed)
[facility] Therapy Treatment Record Phone [phone]   Radiation Therapy was administered to Samuel Garner on: 09/24/2013  12:29 PM and was treatment # 3 out of a planned course of 10 treatments.

## 2013-09-24 NOTE — Progress Notes (Signed)
I am away today, but would recommend removing the foley catheter and then performing serial bladder scans to evaluate his PVRs.  As long as he is not in urinary retention and the bleeding from his prostate is not significant, he can be discharged.  Consider discharge tomorrow or Friday.

## 2013-09-24 NOTE — Progress Notes (Signed)
NUTRITION FOLLOW UP  Intervention:   - Recommend Ensure pudding once daily - Recommend PM nourishment - Encouraged PO intake and small frequent meals  Nutrition Dx:   Increased nutrient needs (protein/kcal) related to increased demand for nutrients as evidenced by dx of prostate cancer.   Goal:   Pt to meet >/= 90% of their estimated nutrition needs    Monitor:   Total protein/energy intake, labs, weights, renal profile  Assessment:   1/08: -Pt's family reported pt with decreased appetite since previous hospital admit on 08/29/2013  -Consumes <25% of meals, only takes few bites  -Noted feelings of early satiety and lack of interest in food. Denied nausea/vomiting/abd pain  -Pt and family unsure if wt loss has occurred  -Has not been on supplements previously, pt may benefit from Nepro as pt with minimal PO intake and elevated K and Phos  -NPO for nephrostomy d/t no improvement in BUN/Crt. MD noted pt possible candidate for HD  1/14: -Pt reported slight improvement in appetite, consumes approximately 50% of meals. Tries to eat small amount of each food items -Denied feelings of n/v/abd pain. Has early satiety that occurs with all meals. Encouraged pt to consume small frequent meals upon d/c. -Consuming approximately 50% of Nepro supplement. Would prefer chocolate flavor. Will modify in order set -Would prefer additional fluids, but understands rationale for 1200 ml fluid restriction -Pt willing to try chocolate Ensure pudding to assist with meeting nutrition needs. Will monitor blood glucose. -RN has given pt crackers and peanut butter as nighttime snack previously which pt enjoyed. Will add as PM nourishment -Crt improving. MD noted pt with poor prognosis r/t prostate cancer   Height: Ht Readings from Last 1 Encounters:  09/17/13 5\' 9"  (1.753 m)    Weight Status:   Wt Readings from Last 1 Encounters:  09/23/13 234 lb 12.6 oz (106.5 kg)    Re-estimated needs:  Kcal:  2200-2400  Protein:70-85 gram  Fluid:1200 ml/daily per MD  Skin: WDL-peripheral edeam  Diet Order: Renal 60/70, 1200 ml fluid restriction   Intake/Output Summary (Last 24 hours) at 09/24/13 1333 Last data filed at 09/24/13 0907  Gross per 24 hour  Intake    710 ml  Output   1925 ml  Net  -1215 ml    Last BM: 1/13   Labs:   Recent Labs Lab 09/22/13 0446 09/23/13 0418 09/24/13 0420  NA 143 145 144  K 3.5* 3.5* 3.9  CL 103 104 106  CO2 27 27 29   BUN 33* 27* 23  CREATININE 2.44* 2.06* 1.85*  CALCIUM 8.3* 8.4 8.6  PHOS 2.0* 1.4* 1.6*  GLUCOSE 204* 220* 184*    CBG (last 3)   Recent Labs  09/23/13 2131 09/24/13 0724 09/24/13 1153  GLUCAP 151* 171* 159*    Scheduled Meds: . calcitRIOL  0.25 mcg Oral Q M,W,F  . cholecalciferol  1,000 Units Oral q morning - 10a  . feeding supplement (NEPRO CARB STEADY)  237 mL Oral Q24H  . fenofibrate  54 mg Oral Daily  . ferumoxytol  510 mg Intravenous Q3 days  . insulin aspart  0-15 Units Subcutaneous TID WC  . insulin aspart  0-5 Units Subcutaneous QHS  . metoprolol  100 mg Oral BID  . niacin  500 mg Oral QHS  . simvastatin  40 mg Oral q1800    Continuous Infusions: . sodium chloride 10 mL/hr at 09/20/13 [place] LDN Clinical Dietitian [phone]

## 2013-09-24 NOTE — Progress Notes (Addendum)
Bilateral nephrostomy tubes draining bloody fluid into collection bag. CBI stopped. Foley remains. Patient reports nurse irrigated bladder once this morning. Reports three episodes of night sweats during hospitalization. Discussing discharge but, no definitive plans have been made. Denies nausea, vomiting, headache, dizziness or diarrhea.   Oriented patient to staff and routine of the clinic. Provided patient with [facility] handbook then, reviewed pertinent information. Educated patient reference potential side effects and management such as, fatigue, skin changes, and bowel/bladder changes. Provided patient with this writer's business card and encourage he and his wife to call with future needs. Both verbalized understanding of all reviewed.

## 2013-09-25 ENCOUNTER — Encounter: Payer: Self-pay | Admitting: Radiation Oncology

## 2013-09-25 ENCOUNTER — Ambulatory Visit
Admit: 2013-09-25 | Discharge: 2013-09-25 | Disposition: A | Discharge status: Home / Self Care | Payer: Medicare Other | Attending: Radiation Oncology | Admitting: Radiation Oncology

## 2013-09-25 ENCOUNTER — Encounter (HOSPITAL_COMMUNITY): Payer: Self-pay | Admitting: Radiology

## 2013-09-25 ENCOUNTER — Inpatient Hospital Stay (HOSPITAL_COMMUNITY): Payer: Medicare Other

## 2013-09-25 DIAGNOSIS — E875 Hyperkalemia: Secondary | ICD-10-CM

## 2013-09-25 LAB — RENAL FUNCTION PANEL
ALBUMIN: 2.3 g/dL — AB (ref 3.5–5.2)
BUN: 22 mg/dL (ref 6–23)
CHLORIDE: 105 meq/L (ref 96–112)
CO2: 27 mEq/L (ref 19–32)
CREATININE: 1.74 mg/dL — AB (ref 0.50–1.35)
Calcium: 8.6 mg/dL (ref 8.4–10.5)
GFR calc Af Amer: 41 mL/min — ABNORMAL LOW (ref >90)
GFR calc non Af Amer: 36 mL/min — ABNORMAL LOW (ref >90)
GLUCOSE: 189 mg/dL — AB (ref 70–99)
PHOSPHORUS: 1.4 mg/dL — AB (ref 2.3–4.6)
Potassium: 3.6 mEq/L — ABNORMAL LOW (ref 3.7–5.3)
Sodium: 144 mEq/L (ref 137–147)

## 2013-09-25 LAB — CBC
HEMATOCRIT: 23.8 % — AB (ref 39.0–52.0)
Hemoglobin: 7.5 g/dL — ABNORMAL LOW (ref 13.0–17.0)
MCH: 29 pg (ref 26.0–34.0)
MCHC: 31.5 g/dL (ref 30.0–36.0)
MCV: 91.9 fL (ref 78.0–100.0)
PLATELETS: 254 10*3/uL (ref 150–400)
RBC: 2.59 MIL/uL — AB (ref 4.22–5.81)
RDW: 15 % (ref 11.5–15.5)
WBC: 8.2 10*3/uL (ref 4.0–10.5)

## 2013-09-25 LAB — GLUCOSE, CAPILLARY
GLUCOSE-CAPILLARY: 140 mg/dL — AB (ref 70–99)
GLUCOSE-CAPILLARY: 185 mg/dL — AB (ref 70–99)
Glucose-Capillary: 203 mg/dL — ABNORMAL HIGH (ref 70–99)
Glucose-Capillary: 155 mg/dL — ABNORMAL HIGH (ref 70–99)
Glucose-Capillary: 131 mg/dL — ABNORMAL HIGH (ref 70–99)

## 2013-09-25 MED ORDER — MIDAZOLAM HCL 2 MG/2ML IJ SOLN
INTRAMUSCULAR | Status: AC
  Filled 2013-09-25: qty 2

## 2013-09-25 MED ORDER — HYDROCODONE-ACETAMINOPHEN 5-325 MG PO TABS
1.0000 | ORAL_TABLET | ORAL | Status: DC | PRN
  Administered 2013-09-25: 1 via ORAL
  Filled 2013-09-25: qty 1

## 2013-09-25 MED ORDER — FENTANYL CITRATE 0.05 MG/ML IJ SOLN
INTRAMUSCULAR | Status: AC | PRN
  Administered 2013-09-25: 100 ug via INTRAVENOUS

## 2013-09-25 MED ORDER — MIDAZOLAM HCL 2 MG/2ML IJ SOLN
INTRAMUSCULAR | Status: AC | PRN
  Administered 2013-09-25 (×4): 1 mg via INTRAVENOUS

## 2013-09-25 MED ORDER — CIPROFLOXACIN IN D5W 400 MG/200ML IV SOLN
400.0000 mg | Freq: Once | INTRAVENOUS | Status: AC
  Administered 2013-09-25: 400 mg via INTRAVENOUS
  Filled 2013-09-25: qty 200

## 2013-09-25 MED ORDER — IOHEXOL 300 MG/ML  SOLN
50.0000 mL | Freq: Once | INTRAMUSCULAR | Status: AC | PRN
  Administered 2013-09-25: 20 mL

## 2013-09-25 MED ORDER — LIDOCAINE HCL 1 % IJ SOLN
INTRAMUSCULAR | Status: AC
  Filled 2013-09-25: qty 20

## 2013-09-25 MED ORDER — IOHEXOL 300 MG/ML  SOLN: 10.0000 mL | Freq: Once | INTRAMUSCULAR | Status: DC | PRN

## 2013-09-25 MED ORDER — FENTANYL CITRATE 0.05 MG/ML IJ SOLN
INTRAMUSCULAR | Status: AC
  Filled 2013-09-25: qty 6

## 2013-09-25 NOTE — Progress Notes (Signed)
[facility] PROGRESS NOTE  STANFORD STRAUCH OHY:[id number] DOB: [birth year] DOA: 09/17/2013 PCP: Delphina Cahill, MD  Assessment/Plan: Acute renal failure  -Obstructive renal failure secondary to aggressive prostate cancer  -Cont with IVF as tolerated, patient's current creatinine= 1.74 -Nephrology signed off the case on 09/22/2013  -Cont to avoid nephrotoxic drugs -Patient scheduled for procedure today; antegrade pyelograms and attempts at placing nephroureterostomy tubes bilaterally. Spoke with Dr. Louis Meckel (neurology) and the plan will be for discharge on Friday or Saturday with close followup   Hyperkalemia  -Resolved will continue to monitor   DM  -Continue moderate SSI for control  HTN  -Stable continue current medication regimen  Gout -Hold allopurinol given ARF; patient currently is asymptomatic  Hematuria  -Continued hematuria in Foley catheter; amount of hematuria has decreased significantly. Urology is following will await their recommendation   Anemia  -s/p PRBC transfusion -H./H. stable continue to monitor  Prostate cancer  -Oncology notes reviewed. Overall poor prognosis -Not a candidate for surgery -Started Radiation tx on 09/22/13. Patient will continue daily radiation therapy upon discharge.    Code Status: Full Family Communication:  Disposition Plan: Per radiation oncology/urology   Consultants: Dr. Renaldo Reel (nephrology) Dr. Louis Meckel (urology) Dr. Tyler Pita (radiation oncology)     Procedures:  placement of bilateral nephrostomy tubes on 09/18/2013 External beam radiation for progressive prostate cancer started 09/22/2013>>    Antibiotics:    HPI/Subjective: 80 y.o. male PMHx CKD secondary to obstructive prostate cancer, DM, HTN,dyslipidemia, type IV renal tubular acidosis, stage III/IV chronic kidney disease and prostate cancer who was recently discharged on 09/02/13. During his recent stay CT scan of abdomen and  pelvis performed 08/15/2013 showed a 6.0 x 4.7 cm irregular mass posteriorly and inferiorly in the urinary bladder concerning for neoplasm. He was also found to have an enlarged right external iliac lymph nodes concerning for metastatic disease. Patient was discharged from the emergency department to follow up with urology as an outpatient. He was seen by Dr. Louis Meckel on 08/29/2013 for obstructive Foley catheter that was replaced. On 08/29/2013 he underwentr cystoscopy with clot back lesion as well as TURP, procedure performed by Dr. Lynann Bologna. Postprocedure he was placed on continuous bladder irrigation. During this hospitalization, his hemoglobin trended down to 7.0 from 10.0 on 08/28/2013, for which he was typed and cross and transfuse with 2 units of packed red blood cells. He was also found to have worsening kidney function, with his creatinine going from 3.73 on 08/28/2013 4.76 on 08/31/2013. He was started on IV fluids, with his creatinine trended down to 3.45 on 09/02/2013 after receiving NS. I suspect acute on chronic renal failure secondary to anemia and volume depletion. His hemoglobin remained stable at 8.0 by 09/02/2013. He was seen by Dr. Louis Meckel who recommended discharging him on a Foley catheter as he failed a voiding trial. Patient was discharged in stable condition on 09/02/2013 followup with urology in the next week.  Patient returned to ED on 09/17/2013  The patient presented for follow up with complaints of recurrent hematuria and urinary incontinence. The patient was noted to have just under 500cc of urine via bladder scan and a foley was placed. Bloodwork demonstrated a K of 6.4 with a Cr of over 11 (baseline of around 3). The patient was given glucose with insulin as well as kayexalate. Per Dr. Jamal Maes (nephrology)  Epic note 09/21/2013, after placement of bilateral nephrostomy tubes on 09/18/2013 patient showed marked improvement in renal function. Patient's obstruction is diagnosed  as poorly  differentiated small cell cancer with neuroendocrine features to be treated with external beam radiation. On 09/22/2013 nephrology signed off case.  Patient received first dose of external beam radiation on 09/22/2013, 09/23/2013 patient received treatment #2. 09/24/2013 patient sitting in chair comfortably, negative abdominal pain negative CVA tenderness. Patient has been advised of plan for XRT for invasive prostate cancer. 09/25/2013 patient and family aware of short-term and long-term plan of care. Patient sitting in chair comfortably NAD.    Objective: Filed Vitals:   09/24/13 0429 09/24/13 1404 09/24/13 2058 09/25/13 0700  BP: 132/60 156/67 129/54 157/66  Pulse: 70 92 91 88  Temp: 98.6 F (37 C) 98.6 F (37 C) 98.3 F (36.8 C) 98.1 F (36.7 C)  TempSrc: Oral Oral Oral Oral  Resp: 20 18 18 20   Height:      Weight:      SpO2: 94% 98% 98% 98%    Intake/Output Summary (Last 24 hours) at 09/25/13 0840 Last data filed at 09/25/13 0700  Gross per 24 hour  Intake   1040 ml  Output   1580 ml  Net   -540 ml   Filed Weights   09/17/13 2026 09/23/13 0430  Weight: 114.5 kg (252 lb 6.8 oz) 106.5 kg (234 lb 12.6 oz)    Exam:   General:  A./O. x4, NAD  Cardiovascular: Regular rhythm and rate, negative murmurs rubs or gallops, DP/PT pulse +1 bilateral  Respiratory: Clear to auscultation bilateral  Abdomen: Soft, nontender, negative CVA tenderness, bilateral nephrostomy tubes with serosanguineous fluid, negative sign of infection at site of entry, Foley catheter with dark urine.  Musculoskeletal: Negative pedal edema   Data Reviewed: Basic Metabolic Panel:  Recent Labs Lab 09/21/13 0418 09/22/13 0446 09/23/13 0418 09/24/13 0420 09/25/13 0447  NA 145 143 145 144 144  K 3.2* 3.5* 3.5* 3.9 3.6*  CL 102 103 104 106 105  CO2 28 27 27 29 27   GLUCOSE 201* 204* 220* 184* 189*  BUN 47* 33* 27* 23 22  CREATININE 3.85* 2.44* 2.06* 1.85* 1.74*  CALCIUM 8.1* 8.3* 8.4 8.6 8.6  PHOS  2.9 2.0* 1.4* 1.6* 1.4*   Liver Function Tests:  Recent Labs Lab 09/21/13 0418 09/22/13 0446 09/23/13 0418 09/24/13 0420 09/25/13 0447  ALBUMIN 2.1* 2.1* 2.1* 2.2* 2.3*   No results found for this basename: LIPASE, AMYLASE,  in the last 168 hours No results found for this basename: AMMONIA,  in the last 168 hours CBC:  Recent Labs Lab 09/21/13 0418 09/22/13 0446 09/23/13 0418 09/24/13 0420 09/25/13 0447  WBC 8.7 7.1 8.2 7.5 8.2  HGB 6.6* 7.6* 7.5* 7.5* 7.5*  HCT 20.4* 23.9* 23.6* 24.0* 23.8*  MCV 88.7 90.5 90.4 92.0 91.9  PLT 263 265 270 246 254   Cardiac Enzymes: No results found for this basename: CKTOTAL, CKMB, CKMBINDEX, TROPONINI,  in the last 168 hours BNP (last 3 results)  Recent Labs  09/01/13 0513  PROBNP 2536.0*   CBG:  Recent Labs Lab 09/24/13 0724 09/24/13 1153 09/24/13 1652 09/24/13 2210 09/25/13 0740  GLUCAP 171* 159* 189* 203* 155*    Recent Results (from the past 240 hour(s))  URINE CULTURE     Status: None   Collection Time    09/17/13  4:44 PM      Result Value Range Status   Specimen Description URINE, CATHETERIZED   Final   Special Requests NONE   Final   Culture  Setup Time     Final  Value: 09/17/2013 21:43     Performed at [facility]     Final   Value: NO GROWTH     Performed at [facility]   Culture     Final   Value: NO GROWTH     Performed at [facility]   Report Status 09/18/2013 FINAL   Final     Studies: No results found.  Scheduled Meds: . calcitRIOL  0.25 mcg Oral Q M,W,F  . cholecalciferol  1,000 Units Oral q morning - 10a  . feeding supplement (ENSURE)  1 Container Oral Q24H  . feeding supplement (NEPRO CARB STEADY)  237 mL Oral Q24H  . fenofibrate  54 mg Oral Daily  . insulin aspart  0-15 Units Subcutaneous TID WC  . insulin aspart  0-5 Units Subcutaneous QHS  . metoprolol  100 mg Oral BID  . niacin  500 mg Oral QHS  . simvastatin  40 mg Oral q1800    Continuous Infusions: . sodium chloride 10 mL/hr at 09/20/13 2206    Principal Problem:   Obstructive uropathy Active Problems:   Diabetes mellitus, type 2   Hypertension   Chronic kidney disease   Gross hematuria   ARF (acute renal failure)    Time spent: 40 minute    Gabrille Kilbride, J  [facility] Pager [phone]. If 7PM-7AM, please contact night-coverage at [email or web address], password [facility] 09/25/2013, 8:40 AM  LOS: 8 days

## 2013-09-25 NOTE — Progress Notes (Signed)
51M Admitted on 09/17/13 for gross hematuria, acute on chronic renal failure, anemia, and hyperkalemia.  Patient recently diagnosed with aggressive prostate cancer and will be started on a course of external beam radiation for control of local symptoms - bleeding.  Oncology will re-evaluate once creatinine nadirs.  Interval: No issues since last evaluation Scan bloody drainage from foley, neph tubes draining clear urine.   Filed Vitals:   09/24/13 0429 09/24/13 1404 09/24/13 2058 09/25/13 0700  BP: 132/60 156/67 129/54 157/66  Pulse: 70 92 91 88  Temp: 98.6 F (37 C) 98.6 F (37 C) 98.3 F (36.8 C) 98.1 F (36.7 C)  TempSrc: Oral Oral Oral Oral  Resp: 20 18 18 20   Height:      Weight:      SpO2: 94% 98% 98% 98%   NAD Abdomen is soft neph tubes draining clear urine Foley catheter dark bloody drainage.   Recent Labs  09/23/13 0418 09/24/13 0420 09/25/13 0447  WBC 8.2 7.5 8.2  HGB 7.5* 7.5* 7.5*  HCT 23.6* 24.0* 23.8*    Recent Labs  09/23/13 0418 09/24/13 0420 09/25/13 0447  NA 145 144 144  K 3.5* 3.9 3.6*  CL 104 106 105  CO2 27 29 27   GLUCOSE 220* 184* 189*  BUN 27* 23 22  CREATININE 2.06* 1.85* 1.74*  CALCIUM 8.4 8.6 8.6   No results found for this basename: LABPT, INR,  in the last 72 hours No results found for this basename: LABURIN,  in the last 72 hours Results for orders placed during the hospital encounter of 09/17/13  URINE CULTURE     Status: None   Collection Time    09/17/13  4:44 PM      Result Value Range Status   Specimen Description URINE, CATHETERIZED   Final   Special Requests NONE   Final   Culture  Setup Time     Final   Value: 09/17/2013 21:43     Performed at [facility]     Final   Value: NO GROWTH     Performed at [facility]   Culture     Final   Value: NO GROWTH     Performed at [facility]   Report Status 09/18/2013 FINAL   Final   Imp: Aggressive CaP resulting in gross hematuria  - still requires catheter, if for nothing else it is helping with hemostasis. Anemia secondary to acute blood loss and chronic disease Acute on chronic renal failure - likely component of pre-renal from reduced hemoglobin and obstructive from prostate DM/CAD/PVD, but is improving.  Recommendations: Discussed with patient beginning process of getting neph tubes out - would like to start with antegrade pyelograms and attempts at placing nephroureterostomy tubes bilaterally.  Will wait to remove foley - one thing at a time. I placed the order for IR as well as made the patient NPO.  Family updated. Will continue to follow.

## 2013-09-25 NOTE — Progress Notes (Signed)
OT Cancellation Note  Patient Details Name: Samuel Garner MRN: [id number] DOB: 1933/07/15   Cancelled Treatment:    Reason Eval/Treat Not Completed: Patient at procedure or test/ unavailable  Jules Schick [phone] 09/25/2013, 12:45 PM

## 2013-09-25 NOTE — Progress Notes (Signed)
PT Note  Patient Details Name: Samuel Garner MRN: [id number] DOB: [birth year]   Cancelled Treatment:    Reason Eval/Treat Not Completed: Other--Observed pt up ambulating with RW in hallway with wife accompanying him. Spoke briefly with pt about PT needs-pt denied any further needs at this time. Will sign off.  MD please reorder if needs change. Thanks.    Weston Anna, MPT Pager: [phone]

## 2013-09-25 NOTE — Procedures (Signed)
Convert bilat perc nephs to antegrade nephroureterals, capped. No complication No blood loss. See complete dictation in [facility].

## 2013-09-25 NOTE — H&P (Signed)
Samuel Garner is an 80 y.o. male.   Chief Complaint: Prostate Ca; B hydronephrosis B Percutaneous nephrostomies were placed 09/18/13 Has done well UOP clear yellow from [facility] Renal function normalizing Bun/Cr: 22/1.74 Urology - Dr Louis Meckel has requested B nephrostograms with B nephroureteral stent placement  HPI: DM; HTN; prostate ca; HLD; CAD/MI; CKD   Past Medical History  Diagnosis Date  . Diabetes mellitus, type 2     with microalbuminuria  . Hypertension   . Arteriosclerotic cardiovascular disease (ASCVD)      2 vessel percutaneous transluminal coronary angioplasty in 3/93  . Hyperlipidemia     low HDL, high triglycerides  . Tobacco abuse, in remission     discontinued in 1993  . Gout   . Chronic kidney disease     creatinine 1.8 in 2002, 1.6 1n 2007 and 1.0 and 2008; deterioration in renal function; Secondary hyperparathyroidism  . Type IV renal tubular acidosis     With hyperkalemia  . Myocardial infarction     1993  . Small cell carcinoma of prostate     Past Surgical History  Procedure Laterality Date  . Hernia repair    . Coronary angioplasty  1993  . Knee arthroscopy    . Transurethral resection of prostate N/A 08/29/2013    Procedure: TRANSURETHRAL RESECTION OF THE PROSTATE (TURP) and Clot evacuation;  Surgeon: Ardis Hughs, MD;  Location: [facility];  Service: Urology;  Laterality: N/A;    Family History  Problem Relation Age of Onset  . Diabetes Mother   . Heart attack Father    Social History:  reports that he quit smoking about 22 years ago. He has never used smokeless tobacco. He reports that he does not drink alcohol or use illicit drugs.  Allergies:  Allergies  Allergen Reactions  . Lisinopril     REACTION: acute renal failure: Patient is not familiar with this medication or an allergy    Medications Prior to Admission  Medication Sig Dispense Refill  . acetaminophen (TYLENOL) 500 MG tablet Take 500-1,000 mg by mouth every 6 (six) hours as  needed for moderate pain or headache.       . allopurinol (ZYLOPRIM) 100 MG tablet Take 100 mg by mouth at bedtime.       . calcitRIOL (ROCALTROL) 0.25 MCG capsule Take 0.25 mcg by mouth every Monday, Wednesday, and Friday.       . Cholecalciferol (VITAMIN D3) 1000 UNITS CAPS Take 1,000 Units by mouth every morning.       . fenofibrate micronized (ANTARA) 43 MG capsule Take 43 mg by mouth every morning.       . furosemide (LASIX) 40 MG tablet Take 40 mg by mouth every morning.      . metoprolol (LOPRESSOR) 100 MG tablet Take 100 mg by mouth 2 (two) times daily.      . Multiple Vitamins-Minerals (PRESERVISION/LUTEIN) CAPS Take 1 capsule by mouth at bedtime.       . niacin 500 MG tablet Take 500 mg by mouth at bedtime.      . pioglitazone (ACTOS) 30 MG tablet Take 30 mg by mouth every morning.       . pravastatin (PRAVACHOL) 40 MG tablet Take 40 mg by mouth at bedtime.      . verapamil (CALAN-SR) 120 MG CR tablet Take 1 tablet (120 mg total) by mouth at bedtime.  30 tablet  11  . aspirin EC 81 MG tablet Take 81 mg by mouth daily.  Results for orders placed during the hospital encounter of 09/17/13 (from the past 48 hour(s))  GLUCOSE, CAPILLARY     Status: Abnormal   Collection Time    09/23/13 12:52 PM      Result Value Range   Glucose-Capillary 153 (*) 70 - 99 mg/dL   Comment 1 Documented in Chart     Comment 2 Notify RN    GLUCOSE, CAPILLARY     Status: Abnormal   Collection Time    09/23/13  5:17 PM      Result Value Range   Glucose-Capillary 180 (*) 70 - 99 mg/dL  GLUCOSE, CAPILLARY     Status: Abnormal   Collection Time    09/23/13  9:31 PM      Result Value Range   Glucose-Capillary 151 (*) 70 - 99 mg/dL  RENAL FUNCTION PANEL     Status: Abnormal   Collection Time    09/24/13  4:20 AM      Result Value Range   Sodium 144  137 - 147 mEq/L   Potassium 3.9  3.7 - 5.3 mEq/L   Chloride 106  96 - 112 mEq/L   CO2 29  19 - 32 mEq/L   Glucose, Bld 184 (*) 70 - 99 mg/dL    BUN 23  6 - 23 mg/dL   Creatinine, Ser 1.85 (*) 0.50 - 1.35 mg/dL   Calcium 8.6  8.4 - 10.5 mg/dL   Phosphorus 1.6 (*) 2.3 - 4.6 mg/dL   Albumin 2.2 (*) 3.5 - 5.2 g/dL   GFR calc non Af Amer 33 (*) >90 mL/min   GFR calc Af Amer 39 (*) >90 mL/min   Comment: (NOTE)     The eGFR has been calculated using the CKD EPI equation.     This calculation has not been validated in all clinical situations.     eGFR's persistently <90 mL/min signify possible Chronic Kidney     Disease.  CBC     Status: Abnormal   Collection Time    09/24/13  4:20 AM      Result Value Range   WBC 7.5  4.0 - 10.5 K/uL   RBC 2.61 (*) 4.22 - 5.81 MIL/uL   Hemoglobin 7.5 (*) 13.0 - 17.0 g/dL   HCT 24.0 (*) 39.0 - 52.0 %   MCV 92.0  78.0 - 100.0 fL   MCH 28.7  26.0 - 34.0 pg   MCHC 31.3  30.0 - 36.0 g/dL   RDW 14.8  11.5 - 15.5 %   Platelets 246  150 - 400 K/uL  GLUCOSE, CAPILLARY     Status: Abnormal   Collection Time    09/24/13  7:24 AM      Result Value Range   Glucose-Capillary 171 (*) 70 - 99 mg/dL  GLUCOSE, CAPILLARY     Status: Abnormal   Collection Time    09/24/13 11:53 AM      Result Value Range   Glucose-Capillary 159 (*) 70 - 99 mg/dL  GLUCOSE, CAPILLARY     Status: Abnormal   Collection Time    09/24/13  4:52 PM      Result Value Range   Glucose-Capillary 189 (*) 70 - 99 mg/dL  GLUCOSE, CAPILLARY     Status: Abnormal   Collection Time    09/24/13 10:10 PM      Result Value Range   Glucose-Capillary 203 (*) 70 - 99 mg/dL   Comment 1 Documented in Chart     Comment  2 Notify RN    RENAL FUNCTION PANEL     Status: Abnormal   Collection Time    09/25/13  4:47 AM      Result Value Range   Sodium 144  137 - 147 mEq/L   Potassium 3.6 (*) 3.7 - 5.3 mEq/L   Chloride 105  96 - 112 mEq/L   CO2 27  19 - 32 mEq/L   Glucose, Bld 189 (*) 70 - 99 mg/dL   BUN 22  6 - 23 mg/dL   Creatinine, Ser 1.74 (*) 0.50 - 1.35 mg/dL   Calcium 8.6  8.4 - 10.5 mg/dL   Phosphorus 1.4 (*) 2.3 - 4.6 mg/dL   Albumin  2.3 (*) 3.5 - 5.2 g/dL   GFR calc non Af Amer 36 (*) >90 mL/min   GFR calc Af Amer 41 (*) >90 mL/min   Comment: (NOTE)     The eGFR has been calculated using the CKD EPI equation.     This calculation has not been validated in all clinical situations.     eGFR's persistently <90 mL/min signify possible Chronic Kidney     Disease.  CBC     Status: Abnormal   Collection Time    09/25/13  4:47 AM      Result Value Range   WBC 8.2  4.0 - 10.5 K/uL   RBC 2.59 (*) 4.22 - 5.81 MIL/uL   Hemoglobin 7.5 (*) 13.0 - 17.0 g/dL   HCT 23.8 (*) 39.0 - 52.0 %   MCV 91.9  78.0 - 100.0 fL   MCH 29.0  26.0 - 34.0 pg   MCHC 31.5  30.0 - 36.0 g/dL   RDW 15.0  11.5 - 15.5 %   Platelets 254  150 - 400 K/uL  GLUCOSE, CAPILLARY     Status: Abnormal   Collection Time    09/25/13  7:40 AM      Result Value Range   Glucose-Capillary 155 (*) 70 - 99 mg/dL   No results found.  Review of Systems  Constitutional: Negative for fever.  Respiratory: Negative for shortness of breath.   Cardiovascular: Negative for chest pain.  Gastrointestinal: Negative for nausea, vomiting and abdominal pain.  Genitourinary: Positive for hematuria. Negative for flank pain.  Neurological: Negative for dizziness and weakness.    Blood pressure 157/66, pulse 88, temperature 98.1 F (36.7 C), temperature source Oral, resp. rate 20, height $RemoveBe'5\' 9"'[id number]$  (1.753 m), weight 234 lb 12.6 oz (106.5 kg), SpO2 98.00%. Physical Exam  Constitutional: He is oriented to person, place, and time. He appears well-nourished.  Cardiovascular: Normal rate and regular rhythm.   No murmur heard. Respiratory: Effort normal and breath sounds normal. He has no wheezes.  GI: Soft. Bowel sounds are normal. There is no tenderness.  Musculoskeletal: Normal range of motion.  Neurological: He is alert and oriented to person, place, and time.  Skin: Skin is warm and dry.  Psychiatric: He has a normal mood and affect. His behavior is normal. Judgment and thought  content normal.     Assessment/Plan B PCNs placed 09/18/2013 Normalizing renal fxn UOP good and clear yellow from [facility] Scheduled now for nephrostograms and B nephroureteral stents Pt aware of procedure benefits and risks and agreeable to proceed Consent signed andin chart   Aubriegh Minch A 09/25/2013, 8:43 AM

## 2013-09-25 NOTE — Progress Notes (Signed)
  Radiation Oncology         ([phone]) [phone] ________________________________  Name: Samuel Garner MRN: [id number]  Date: 09/24/2013  DOB: 1932/12/19  Weekly Radiation Therapy Management  INPATIENT  Current Dose: 9 Gy     Planned Dose:  30 Gy  Narrative . . . . . . . . The patient presents for routine under treatment assessment.                                   The patient is without complaint.                                 Set-up films were reviewed.                                 The chart was checked. Physical Findings. . .  blood pressure is 147/53 and his pulse is 72. His respiration is 16. . Weight essentially stable.  No significant changes. Impression . . . . . . . The patient is tolerating radiation. Plan . . . . . . . . . . . . Continue treatment as planned.  ________________________________  Sheral Apley. Tammi Klippel, M.D.

## 2013-09-25 NOTE — Progress Notes (Signed)
Patient back from IR post nephroureteral placement, bil site dressing clean dry and intact, Patient denies any pain or distress. Will continue to assess patient.

## 2013-09-26 ENCOUNTER — Encounter (HOSPITAL_COMMUNITY): Payer: Medicare Other

## 2013-09-26 ENCOUNTER — Ambulatory Visit
Admit: 2013-09-26 | Discharge: 2013-09-26 | Disposition: A | Discharge status: Home / Self Care | Payer: Medicare Other | Attending: Radiation Oncology | Admitting: Radiation Oncology

## 2013-09-26 DIAGNOSIS — N3289 Other specified disorders of bladder: Secondary | ICD-10-CM

## 2013-09-26 LAB — RENAL FUNCTION PANEL
Albumin: 2.3 g/dL — ABNORMAL LOW (ref 3.5–5.2)
BUN: 24 mg/dL — ABNORMAL HIGH (ref 6–23)
CALCIUM: 8.6 mg/dL (ref 8.4–10.5)
CO2: 26 mEq/L (ref 19–32)
Chloride: 106 mEq/L (ref 96–112)
Creatinine, Ser: 1.77 mg/dL — ABNORMAL HIGH (ref 0.50–1.35)
GFR calc non Af Amer: 35 mL/min — ABNORMAL LOW (ref >90)
GFR, EST AFRICAN AMERICAN: 41 mL/min — AB (ref >90)
Glucose, Bld: 203 mg/dL — ABNORMAL HIGH (ref 70–99)
PHOSPHORUS: 1.5 mg/dL — AB (ref 2.3–4.6)
Potassium: 4 mEq/L (ref 3.7–5.3)
Sodium: 144 mEq/L (ref 137–147)

## 2013-09-26 LAB — CBC
HCT: 24.4 % — ABNORMAL LOW (ref 39.0–52.0)
Hemoglobin: 7.7 g/dL — ABNORMAL LOW (ref 13.0–17.0)
MCH: 29.1 pg (ref 26.0–34.0)
MCHC: 31.6 g/dL (ref 30.0–36.0)
MCV: 92.1 fL (ref 78.0–100.0)
PLATELETS: 248 10*3/uL (ref 150–400)
RBC: 2.65 MIL/uL — ABNORMAL LOW (ref 4.22–5.81)
RDW: 15 % (ref 11.5–15.5)
WBC: 7.8 10*3/uL (ref 4.0–10.5)

## 2013-09-26 LAB — GLUCOSE, CAPILLARY
GLUCOSE-CAPILLARY: 185 mg/dL — AB (ref 70–99)
GLUCOSE-CAPILLARY: 198 mg/dL — AB (ref 70–99)
Glucose-Capillary: 179 mg/dL — ABNORMAL HIGH (ref 70–99)

## 2013-09-26 MED ORDER — HYDROCODONE-ACETAMINOPHEN 5-325 MG PO TABS: 1.0000 | ORAL_TABLET | ORAL | Status: DC | PRN

## 2013-09-26 MED ORDER — ENSURE PUDDING PO PUDG: 1.0000 | ORAL | Status: DC

## 2013-09-26 NOTE — Discharge Summary (Signed)
Physician Discharge Summary  Samuel Garner [id number] DOB: 12-Jul-1933 DOA: 09/17/2013  PCP: Delphina Cahill, MD  Admit date: 09/17/2013 Discharge date: 09/26/2013  Time spent: 50 minutes  Recommendations for Outpatient Follow-up:   -Obstructive renal failure secondary to aggressive prostate cancer  -Cont with IVF as tolerated, patient's current creatinine= 1.74  -Nephrology signed off the case on 09/22/2013  -Cont to avoid nephrotoxic drugs  -S/P  antegrade pyelograms and attempts at placing nephroureterostomy tubes bilaterally. Spoke with Dr. Louis Meckel (neurology) and the plan will be for discharge on Friday with followup on Tuesday 20 January at 10:30 at [facility] urology.  -Patient will be discharged with Foley catheter in place, and counseled patient that Foley will be removed on Tuesday 20 January at [facility] urology   DM  -Continue moderate SSI for control   HTN  -Stable continue current medication regimen   Gout  -Continue to Hold allopurinol given ARF; patient currently is asymptomatic   Hematuria  -Continued hematuria in Foley catheter; counseled patient that hematuria probably would never fully clear. Patient to continue with close followup with [facility] Dr. Louis Meckel  -Will set up home health care (palliative care) to assure patient's nephrostomy tube sites do not become infected, and Foley does not become infected/obstructed with clots.  Anemia  -s/p PRBC transfusion  -H./H. stable   Prostate cancer  -Oncology notes reviewed. Overall poor prognosis  -Not a candidate for surgery  -Started Radiation tx on 09/22/13. Patient will continue daily radiation therapy upon discharge. Next radiation treatment scheduled for Tuesday. -[facility] will contact Pt and family to set up home visits      Discharge Diagnoses:  Principal Problem:   Obstructive uropathy Active Problems:   Diabetes mellitus, type 2   Hypertension   Chronic kidney disease    Gross hematuria   ARF (acute renal failure)   Discharge Condition: Stable  Diet recommendation: Diabetic diet  Filed Weights   09/17/13 2026 09/23/13 0430  Weight: 114.5 kg (252 lb 6.8 oz) 106.5 kg (234 lb 12.6 oz)    History of present illness:  80 y.o. male PMHx CKD secondary to obstructive prostate cancer, DM, HTN,dyslipidemia, type IV renal tubular acidosis, stage III/IV chronic kidney disease and prostate cancer who was recently discharged on 09/02/13. During his recent stay CT scan of abdomen and pelvis performed 08/15/2013 showed a 6.0 x 4.7 cm irregular mass posteriorly and inferiorly in the urinary bladder concerning for neoplasm. He was also found to have an enlarged right external iliac lymph nodes concerning for metastatic disease. Patient was discharged from the emergency department to follow up with urology as an outpatient. He was seen by Dr. Louis Meckel on 08/29/2013 for obstructive Foley catheter that was replaced. On 08/29/2013 he underwentr cystoscopy with clot back lesion as well as TURP, procedure performed by Dr. Lynann Bologna. Postprocedure he was placed on continuous bladder irrigation. During this hospitalization, his hemoglobin trended down to 7.0 from 10.0 on 08/28/2013, for which he was typed and cross and transfuse with 2 units of packed red blood cells. He was also found to have worsening kidney function, with his creatinine going from 3.73 on 08/28/2013 4.76 on 08/31/2013. He was started on IV fluids, with his creatinine trended down to 3.45 on 09/02/2013 after receiving NS. I suspect acute on chronic renal failure secondary to anemia and volume depletion. His hemoglobin remained stable at 8.0 by 09/02/2013. He was seen by Dr. Louis Meckel who recommended discharging him on a Foley catheter as he  failed a voiding trial. Patient was discharged in stable condition on 09/02/2013 followup with urology in the next week.  Patient returned to ED on 09/17/2013 The patient presented for follow up  with complaints of recurrent hematuria and urinary incontinence. The patient was noted to have just under 500cc of urine via bladder scan and a foley was placed. Bloodwork demonstrated a K of 6.4 with a Cr of over 11 (baseline of around 3). The patient was given glucose with insulin as well as kayexalate. Per Dr. Jamal Maes (nephrology) Epic note 09/21/2013, after placement of bilateral nephrostomy tubes on 09/18/2013 patient showed marked improvement in renal function. 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 On 09/22/2013 nephrology signed off case. Patient received first dose of external beam radiation on 09/22/2013, 09/23/2013 patient received treatment #2. 09/24/2013 patient sitting in chair comfortably, negative abdominal pain negative CVA tenderness. Patient has been advised of plan for XRT for invasive prostate cancer. 09/25/2013 patient and family aware of short-term and long-term plan of care. Patient sitting in chair comfortably NAD. 1 /16/2015 patient sitting in chair comfortably eating pudding cup. States negative pain, negative nausea. Patient S/P Convert bilat perc nephs to antegrade nephroureterals, capped.      Consultants:  Dr. Renaldo Reel (nephrology)  Dr. Louis Meckel (urology)  Dr. Tyler Pita (radiation oncology)    Procedures:  09/25/2013 Convert bilat perc nephs to antegrade nephroureterals, capped. By Dr. Wynona Luna   -placement of bilateral nephrostomy tubes on 09/18/2013   -External beam radiation for progressive prostate cancer started 09/22/2013>>  Antibiotics:     Discharge Exam: Filed Vitals:   09/25/13 2040 09/26/13 0614 09/26/13 0924 09/26/13 1450  BP: 146/60 147/67 142/53 126/51  Pulse: 86 75 92 73  Temp: 98 F (36.7 C) 98.1 F (36.7 C)  98.2 F (36.8 C)  TempSrc: Oral Oral  Oral  Resp: 18 18  20   Height:      Weight:      SpO2: 96% 97%   98%   General: A./O. x4, NAD  Cardiovascular: Regular rhythm and rate, negative murmurs rubs or gallops, DP/PT pulse +1 bilateral  Respiratory: Clear to auscultation bilateral  Abdomen: Soft, nontender, negative CVA tenderness, bilateral nephrostomy tubes cut off capped and covered. negative sign of infection at site of entry, Foley catheter with increased dark urine, although somewhat lighter than yesterday.  Musculoskeletal: Negative pedal edema, , nephrostomy tubes have been cut off and capped, covered and clean negative discharge. Negative erythema/warmth   Discharge Instructions   Future Appointments Provider Department Dept Phone   09/29/2013 12:00 PM [facility] [phone]   09/30/2013 2:40 PM [facility] [phone]   10/01/2013 1:40 PM [facility] [phone]   10/02/2013 4:40 PM [facility] [phone]   10/03/2013 4:40 PM [facility] [phone]       Medication List         acetaminophen 500 MG tablet  Commonly known as:  TYLENOL  Take 500-1,000 mg by mouth every 6 (six) hours as needed for moderate pain or headache.     allopurinol 100 MG tablet  Commonly known as:  ZYLOPRIM  Take 100 mg by mouth at bedtime.     aspirin EC 81 MG tablet  Take 81 mg by mouth  daily.     calcitRIOL 0.25 MCG capsule  Commonly known as:  ROCALTROL  Take 0.25 mcg by mouth every Monday, Wednesday, and Friday.     fenofibrate micronized 43 MG capsule  Commonly known as:  ANTARA  Take 43 mg by mouth every morning.     furosemide 40 MG tablet  Commonly known as:  LASIX  Take 40 mg by mouth every morning.     metoprolol 100 MG tablet  Commonly known as:  LOPRESSOR  Take 100 mg by mouth 2 (two) times daily.      niacin 500 MG tablet  Take 500 mg by mouth at bedtime.     pioglitazone 30 MG tablet  Commonly known as:  ACTOS  Take 30 mg by mouth every morning.     pravastatin 40 MG tablet  Commonly known as:  PRAVACHOL  Take 40 mg by mouth at bedtime.     PRESERVISION/LUTEIN Caps  Take 1 capsule by mouth at bedtime.     verapamil 120 MG CR tablet  Commonly known as:  CALAN-SR  Take 1 tablet (120 mg total) by mouth at bedtime.     Vitamin D3 1000 UNITS Caps  Take 1,000 Units by mouth every morning.       Allergies  Allergen Reactions  . Lisinopril     REACTION: acute renal failure: Patient is not familiar with this medication or an allergy      The results of significant diagnostics from this hospitalization (including imaging, microbiology, ancillary and laboratory) are listed below for reference.    Significant Diagnostic Studies: Dg Chest 2 View  09/01/2013   CLINICAL DATA:  Two-day history of cough, history of previous smoking  EXAM: CHEST  2 VIEW  COMPARISON:  Chest x-ray of August 29, 2013.  FINDINGS: The lungs are adequately inflated. The interstitial markings are mildly increased though stable. The cardiopericardial silhouette is mildly enlarged. The pulmonary vascularity is not engorged. The mediastinum is normal in width. There is no pleural effusion. The observed portions of the bony thorax appear normal.  IMPRESSION: There is no evidence of pneumonia nor CHF. Chronically increased interstitial markings likely reflect scarring though certainly one cannot exclude superimposed acute bronchitis with subsegmental atelectasis. Followup films are recommended if the patient's symptoms do not improve with anticipated therapy.   Electronically Signed   By: David  Martinique   On: 09/01/2013 13:27   Dg Chest 2 View  08/29/2013   CLINICAL DATA:  Preop  EXAM: CHEST  2 VIEW  COMPARISON:  None.  FINDINGS: Lungs are clear. No pleural effusion or pneumothorax.  Cardiomegaly.  Mild degenerative  changes of the visualized thoracolumbar spine.  IMPRESSION: No evidence of acute cardiopulmonary disease.   Electronically Signed   By: Julian Hy M.D.   On: 08/29/2013 14:25   US Renal  09/17/2013   CLINICAL DATA:  Hematuria  EXAM: RENAL/URINARY TRACT ULTRASOUND COMPLETE  COMPARISON:  08/31/2013  FINDINGS: Right Kidney:  Length: 11.5 cm in length. Mild hydronephrosis is not significantly changed. No mass. Moderate cortical thinning.  Left Kidney:  Length: 13.0 cm in length. Mild hydronephrosis is not significantly changed. Moderate cortical thinning. No mass.  Bladder:  Foley catheter decompresses the bladder.  IMPRESSION: Stable mild bilateral hydronephrosis.   Electronically Signed   By: Maryclare Bean M.D.   On: 09/17/2013 20:06   US Renal  08/31/2013   CLINICAL DATA:  Elevated creatinine, hematuria, bladder mass  EXAM: RENAL/URINARY TRACT ULTRASOUND COMPLETE  COMPARISON:  CT abdomen pelvis dated 08/15/2013  FINDINGS: Right Kidney:  Length: 12.5 cm. Cortical thinning. Mild pelviectasis without frank hydronephrosis (image 7).  Left Kidney:  Length: 13.7 cm. Cortical thinning. 4.5 x 5.3 4.4 cm upper pole renal cyst. 2.2 x 2.6 cm lower pole renal cyst. Mild hydronephrosis.  Bladder:  Decompressed by indwelling Foley catheter.  IMPRESSION: Mild left hydronephrosis.  Mild right pelviectasis without frank hydronephrosis.  Bladder is decompressed by indwelling Foley catheter.   Electronically Signed   By: Julian Hy M.D.   On: 08/31/2013 15:09   Ir Nephrostogram Left  09/25/2013   CLINICAL DATA:  Prostate carcinoma with bilateral ureteral obstruction, post bilateral percutaneous nephrostomy catheter placement. Antegrade nephro ureteral stents are requested to allow trial of antegrade drainage.  EXAM: CONVERSION OF BILATERAL PERCUTANEOUS NEPHROSTOMY CATHETERS TO PERCUTANEOUS ANTEGRADE NEPHROURETERAL CATHETERS UNDER FLUOROSCOPY  TECHNIQUE: The procedure, risks (including but not limited to bleeding,  infection, organ damage), benefits, and alternatives were explained to the patient. Questions regarding the procedure were encouraged and answered. The patient understands and consents to the procedure.  The previously placed nephrostomy catheters and surrounding skin prepped and draped usual sterile fashion. Maximal barrier sterile technique was utilized including caps, mask, sterile gowns, sterile gloves, sterile drape, hand hygiene and skin antiseptic.  The patient was already receiving adequate prophylactic antibiotic coverage.  The skin around the tubes was infiltrated with 1% lidocaine.  Intravenous Fentanyl and Versed were administered as conscious sedation during continuous cardiorespiratory monitoring by the radiology RN, with a total moderate sedation time of 40 minutes.  A small amount of water-soluble contrast was injected through the right nephrostomy catheter to opacify the right renal collecting system. The catheter was cut and exchanged over a Benson wire for a 5-French Kumpe catheter, advanced into the urinary bladder. The Kumpe was exchanged over an Amplatz wire for a 26 cm 10 French nephroureteral catheter, positioned with the distal end in the urinary bladder, proximal loop in the central aspect of the renal collecting system. Contrast injection confirmed appropriate positioning and patency. Catheter was capped to allow internal antegrade drainage.  In similar fashion, a small amount of water-soluble contrast was injected through the left nephrostomy catheter to opacify the left renal collecting system. The catheter was cut and exchanged over a Benson wire for a 5-French Kumpe catheter, advanced into the urinary bladder. The Kumpe was exchanged over an Amplatz wire for a 26 cm 10 French nephroureteral catheter, positioned with the distal end in the urinary bladder, proximal loop in the central renal collecting system. Contrast injection confirmed appropriate positioning and patency. Catheter was  capped. The patient tolerated the procedure well. No immediate complication.  COMPARISON:  09/18/2013  IMPRESSION: Technically successful conversion of bilateral percutaneous nephrostomy catheters to bilateral antegrade nephroureteral catheters, capped to allow antegrade internal drainage.   Electronically Signed   By: Arne Cleveland M.D.   On: 09/25/2013 16:57   Ir Nephrostogram Right  09/25/2013   CLINICAL DATA:  Prostate carcinoma with bilateral ureteral obstruction, post bilateral percutaneous nephrostomy catheter placement. Antegrade nephro ureteral stents are requested to allow trial of antegrade drainage.  EXAM: CONVERSION OF BILATERAL PERCUTANEOUS NEPHROSTOMY CATHETERS TO PERCUTANEOUS ANTEGRADE NEPHROURETERAL CATHETERS UNDER FLUOROSCOPY  TECHNIQUE: The procedure, risks (including but not limited to bleeding, infection, organ damage), benefits, and alternatives were explained to the patient. Questions regarding the procedure were encouraged and answered. The patient understands and consents to the procedure.  The previously placed nephrostomy catheters and surrounding skin prepped and draped usual sterile  fashion. Maximal barrier sterile technique was utilized including caps, mask, sterile gowns, sterile gloves, sterile drape, hand hygiene and skin antiseptic.  The patient was already receiving adequate prophylactic antibiotic coverage.  The skin around the tubes was infiltrated with 1% lidocaine.  Intravenous Fentanyl and Versed were administered as conscious sedation during continuous cardiorespiratory monitoring by the radiology RN, with a total moderate sedation time of 40 minutes.  A small amount of water-soluble contrast was injected through the right nephrostomy catheter to opacify the right renal collecting system. The catheter was cut and exchanged over a Benson wire for a 5-French Kumpe catheter, advanced into the urinary bladder. The Kumpe was exchanged over an Amplatz wire for a 26 cm 10  French nephroureteral catheter, positioned with the distal end in the urinary bladder, proximal loop in the central aspect of the renal collecting system. Contrast injection confirmed appropriate positioning and patency. Catheter was capped to allow internal antegrade drainage.  In similar fashion, a small amount of water-soluble contrast was injected through the left nephrostomy catheter to opacify the left renal collecting system. The catheter was cut and exchanged over a Benson wire for a 5-French Kumpe catheter, advanced into the urinary bladder. The Kumpe was exchanged over an Amplatz wire for a 26 cm 10 French nephroureteral catheter, positioned with the distal end in the urinary bladder, proximal loop in the central renal collecting system. Contrast injection confirmed appropriate positioning and patency. Catheter was capped. The patient tolerated the procedure well. No immediate complication.  COMPARISON:  09/18/2013  IMPRESSION: Technically successful conversion of bilateral percutaneous nephrostomy catheters to bilateral antegrade nephroureteral catheters, capped to allow antegrade internal drainage.   Electronically Signed   By: Arne Cleveland M.D.   On: 09/25/2013 16:57   Ir Perc Nephrostomy Left  09/18/2013   CLINICAL DATA:  Prostate carcinoma, acute renal insufficiency, bilateral hydronephrosis.  EXAM: BILATERAL PERCUTANEOUS NEPHROSTOMY CATHETER PLACEMENT UNDER ULTRASOUND AND FLUOROSCOPIC GUIDANCE  TECHNIQUE: The procedure, risks (including but not limited to bleeding, infection, organ damage  ), benefits, and alternatives were explained to the patient. Questions regarding the procedure were encouraged and answered. The patient understands and consents to the procedure.  BILATERALflank regionS prepped with Betadine, draped in usual sterile fashion, infiltrated locally with 1% lidocaine.As antibiotic prophylaxis, CEFAZOLIN 2 G was ordered pre-procedure and administered intravenously within one hour  of incision.  Intravenous Fentanyl and Versed were administered as conscious sedation during continuous cardiorespiratory monitoring by the radiology RN, with a total moderate sedation time of 25 minutes.  Under real-time ultrasound guidance, a 21-gauge trocar needle was advanced into a posterior right renal calyx. Ultrasound image documentation was saved. Urine spontaneously returned through the needle. Needle was exchanged over a guidewire for transitional dilator. Contrast injection confirmed appropriate positioning. Catheter was exchanged over a guidewire for a 10 French pigtail catheter, formed centrally within the right renal collecting system. Contrast injection confirms appropriate positioning and patency.  In similar fashion, under real-time ultrasound guidance, a 21-gauge trocar needle was advanced into a posterior left renal calyx. Ultrasound image documentation was saved. Urine spontaneously returned through the needle. Needle was exchanged over a guidewire for transitional dilator. Contrast injection confirmed appropriate positioning. Catheter was exchanged over a guidewire for a 10 French pigtail catheter, formed centrally within the left renal collecting system. Contrast injection confirms appropriate positioning and patency.  Catheters secured externally with 0 Prolene suture and StatLock and placed to external drain bags. No immediate complication.  FLUOROSCOPY TIME:  4 min 30 seconds  IMPRESSION: 1. Technically successful bilateral percutaneous nephrostomy catheter placement.   Electronically Signed   By: Arne Cleveland M.D.   On: 09/18/2013 15:12   Ir Perc Nephrostomy Right  09/18/2013   CLINICAL DATA:  Prostate carcinoma, acute renal insufficiency, bilateral hydronephrosis.  EXAM: BILATERAL PERCUTANEOUS NEPHROSTOMY CATHETER PLACEMENT UNDER ULTRASOUND AND FLUOROSCOPIC GUIDANCE  TECHNIQUE: The procedure, risks (including but not limited to bleeding, infection, organ damage  ), benefits, and  alternatives were explained to the patient. Questions regarding the procedure were encouraged and answered. The patient understands and consents to the procedure.  BILATERALflank regionS prepped with Betadine, draped in usual sterile fashion, infiltrated locally with 1% lidocaine.As antibiotic prophylaxis, CEFAZOLIN 2 G was ordered pre-procedure and administered intravenously within one hour of incision.  Intravenous Fentanyl and Versed were administered as conscious sedation during continuous cardiorespiratory monitoring by the radiology RN, with a total moderate sedation time of 25 minutes.  Under real-time ultrasound guidance, a 21-gauge trocar needle was advanced into a posterior right renal calyx. Ultrasound image documentation was saved. Urine spontaneously returned through the needle. Needle was exchanged over a guidewire for transitional dilator. Contrast injection confirmed appropriate positioning. Catheter was exchanged over a guidewire for a 10 French pigtail catheter, formed centrally within the right renal collecting system. Contrast injection confirms appropriate positioning and patency.  In similar fashion, under real-time ultrasound guidance, a 21-gauge trocar needle was advanced into a posterior left renal calyx. Ultrasound image documentation was saved. Urine spontaneously returned through the needle. Needle was exchanged over a guidewire for transitional dilator. Contrast injection confirmed appropriate positioning. Catheter was exchanged over a guidewire for a 10 French pigtail catheter, formed centrally within the left renal collecting system. Contrast injection confirms appropriate positioning and patency.  Catheters secured externally with 0 Prolene suture and StatLock and placed to external drain bags. No immediate complication.  FLUOROSCOPY TIME:  4 min 30 seconds  IMPRESSION: 1. Technically successful bilateral percutaneous nephrostomy catheter placement.   Electronically Signed   By:  Arne Cleveland M.D.   On: 09/18/2013 15:12   Ir US Guide Bx Asp/drain  09/18/2013   CLINICAL DATA:  Prostate carcinoma, acute renal insufficiency, bilateral hydronephrosis.  EXAM: BILATERAL PERCUTANEOUS NEPHROSTOMY CATHETER PLACEMENT UNDER ULTRASOUND AND FLUOROSCOPIC GUIDANCE  TECHNIQUE: The procedure, risks (including but not limited to bleeding, infection, organ damage  ), benefits, and alternatives were explained to the patient. Questions regarding the procedure were encouraged and answered. The patient understands and consents to the procedure.  BILATERALflank regionS prepped with Betadine, draped in usual sterile fashion, infiltrated locally with 1% lidocaine.As antibiotic prophylaxis, CEFAZOLIN 2 G was ordered pre-procedure and administered intravenously within one hour of incision.  Intravenous Fentanyl and Versed were administered as conscious sedation during continuous cardiorespiratory monitoring by the radiology RN, with a total moderate sedation time of 25 minutes.  Under real-time ultrasound guidance, a 21-gauge trocar needle was advanced into a posterior right renal calyx. Ultrasound image documentation was saved. Urine spontaneously returned through the needle. Needle was exchanged over a guidewire for transitional dilator. Contrast injection confirmed appropriate positioning. Catheter was exchanged over a guidewire for a 10 French pigtail catheter, formed centrally within the right renal collecting system. Contrast injection confirms appropriate positioning and patency.  In similar fashion, under real-time ultrasound guidance, a 21-gauge trocar needle was advanced into a posterior left renal calyx. Ultrasound image documentation was saved. Urine spontaneously returned through the needle. Needle was exchanged over a guidewire for transitional dilator. Contrast injection confirmed appropriate positioning. Catheter was  exchanged over a guidewire for a 10 French pigtail catheter, formed centrally  within the left renal collecting system. Contrast injection confirms appropriate positioning and patency.  Catheters secured externally with 0 Prolene suture and StatLock and placed to external drain bags. No immediate complication.  FLUOROSCOPY TIME:  4 min 30 seconds  IMPRESSION: 1. Technically successful bilateral percutaneous nephrostomy catheter placement.   Electronically Signed   By: Arne Cleveland M.D.   On: 09/18/2013 15:12   Ir US Guide Bx Asp/drain  09/18/2013   CLINICAL DATA:  Prostate carcinoma, acute renal insufficiency, bilateral hydronephrosis.  EXAM: BILATERAL PERCUTANEOUS NEPHROSTOMY CATHETER PLACEMENT UNDER ULTRASOUND AND FLUOROSCOPIC GUIDANCE  TECHNIQUE: The procedure, risks (including but not limited to bleeding, infection, organ damage  ), benefits, and alternatives were explained to the patient. Questions regarding the procedure were encouraged and answered. The patient understands and consents to the procedure.  BILATERALflank regionS prepped with Betadine, draped in usual sterile fashion, infiltrated locally with 1% lidocaine.As antibiotic prophylaxis, CEFAZOLIN 2 G was ordered pre-procedure and administered intravenously within one hour of incision.  Intravenous Fentanyl and Versed were administered as conscious sedation during continuous cardiorespiratory monitoring by the radiology RN, with a total moderate sedation time of 25 minutes.  Under real-time ultrasound guidance, a 21-gauge trocar needle was advanced into a posterior right renal calyx. Ultrasound image documentation was saved. Urine spontaneously returned through the needle. Needle was exchanged over a guidewire for transitional dilator. Contrast injection confirmed appropriate positioning. Catheter was exchanged over a guidewire for a 10 French pigtail catheter, formed centrally within the right renal collecting system. Contrast injection confirms appropriate positioning and patency.  In similar fashion, under real-time  ultrasound guidance, a 21-gauge trocar needle was advanced into a posterior left renal calyx. Ultrasound image documentation was saved. Urine spontaneously returned through the needle. Needle was exchanged over a guidewire for transitional dilator. Contrast injection confirmed appropriate positioning. Catheter was exchanged over a guidewire for a 10 French pigtail catheter, formed centrally within the left renal collecting system. Contrast injection confirms appropriate positioning and patency.  Catheters secured externally with 0 Prolene suture and StatLock and placed to external drain bags. No immediate complication.  FLUOROSCOPY TIME:  4 min 30 seconds  IMPRESSION: 1. Technically successful bilateral percutaneous nephrostomy catheter placement.   Electronically Signed   By: Arne Cleveland M.D.   On: 09/18/2013 15:12   Ir Oris Drone Cath Perc Left  09/25/2013   CLINICAL DATA:  Prostate carcinoma with bilateral ureteral obstruction, post bilateral percutaneous nephrostomy catheter placement. Antegrade nephro ureteral stents are requested to allow trial of antegrade drainage.  EXAM: CONVERSION OF BILATERAL PERCUTANEOUS NEPHROSTOMY CATHETERS TO PERCUTANEOUS ANTEGRADE NEPHROURETERAL CATHETERS UNDER FLUOROSCOPY  TECHNIQUE: The procedure, risks (including but not limited to bleeding, infection, organ damage), benefits, and alternatives were explained to the patient. Questions regarding the procedure were encouraged and answered. The patient understands and consents to the procedure.  The previously placed nephrostomy catheters and surrounding skin prepped and draped usual sterile fashion. Maximal barrier sterile technique was utilized including caps, mask, sterile gowns, sterile gloves, sterile drape, hand hygiene and skin antiseptic.  The patient was already receiving adequate prophylactic antibiotic coverage.  The skin around the tubes was infiltrated with 1% lidocaine.  Intravenous Fentanyl and Versed were  administered as conscious sedation during continuous cardiorespiratory monitoring by the radiology RN, with a total moderate sedation time of 40 minutes.  A small amount of water-soluble contrast was injected through the right nephrostomy catheter to opacify the right  renal collecting system. The catheter was cut and exchanged over a Benson wire for a 5-French Kumpe catheter, advanced into the urinary bladder. The Kumpe was exchanged over an Amplatz wire for a 26 cm 10 French nephroureteral catheter, positioned with the distal end in the urinary bladder, proximal loop in the central aspect of the renal collecting system. Contrast injection confirmed appropriate positioning and patency. Catheter was capped to allow internal antegrade drainage.  In similar fashion, a small amount of water-soluble contrast was injected through the left nephrostomy catheter to opacify the left renal collecting system. The catheter was cut and exchanged over a Benson wire for a 5-French Kumpe catheter, advanced into the urinary bladder. The Kumpe was exchanged over an Amplatz wire for a 26 cm 10 French nephroureteral catheter, positioned with the distal end in the urinary bladder, proximal loop in the central renal collecting system. Contrast injection confirmed appropriate positioning and patency. Catheter was capped. The patient tolerated the procedure well. No immediate complication.  COMPARISON:  09/18/2013  IMPRESSION: Technically successful conversion of bilateral percutaneous nephrostomy catheters to bilateral antegrade nephroureteral catheters, capped to allow antegrade internal drainage.   Electronically Signed   By: Arne Cleveland M.D.   On: 09/25/2013 16:57   Ir Patrice Paradise Colman Cater Cath Perc Right  09/25/2013   CLINICAL DATA:  Prostate carcinoma with bilateral ureteral obstruction, post bilateral percutaneous nephrostomy catheter placement. Antegrade nephro ureteral stents are requested to allow trial of antegrade drainage.  EXAM:  CONVERSION OF BILATERAL PERCUTANEOUS NEPHROSTOMY CATHETERS TO PERCUTANEOUS ANTEGRADE NEPHROURETERAL CATHETERS UNDER FLUOROSCOPY  TECHNIQUE: The procedure, risks (including but not limited to bleeding, infection, organ damage), benefits, and alternatives were explained to the patient. Questions regarding the procedure were encouraged and answered. The patient understands and consents to the procedure.  The previously placed nephrostomy catheters and surrounding skin prepped and draped usual sterile fashion. Maximal barrier sterile technique was utilized including caps, mask, sterile gowns, sterile gloves, sterile drape, hand hygiene and skin antiseptic.  The patient was already receiving adequate prophylactic antibiotic coverage.  The skin around the tubes was infiltrated with 1% lidocaine.  Intravenous Fentanyl and Versed were administered as conscious sedation during continuous cardiorespiratory monitoring by the radiology RN, with a total moderate sedation time of 40 minutes.  A small amount of water-soluble contrast was injected through the right nephrostomy catheter to opacify the right renal collecting system. The catheter was cut and exchanged over a Benson wire for a 5-French Kumpe catheter, advanced into the urinary bladder. The Kumpe was exchanged over an Amplatz wire for a 26 cm 10 French nephroureteral catheter, positioned with the distal end in the urinary bladder, proximal loop in the central aspect of the renal collecting system. Contrast injection confirmed appropriate positioning and patency. Catheter was capped to allow internal antegrade drainage.  In similar fashion, a small amount of water-soluble contrast was injected through the left nephrostomy catheter to opacify the left renal collecting system. The catheter was cut and exchanged over a Benson wire for a 5-French Kumpe catheter, advanced into the urinary bladder. The Kumpe was exchanged over an Amplatz wire for a 26 cm 10 French  nephroureteral catheter, positioned with the distal end in the urinary bladder, proximal loop in the central renal collecting system. Contrast injection confirmed appropriate positioning and patency. Catheter was capped. The patient tolerated the procedure well. No immediate complication.  COMPARISON:  09/18/2013  IMPRESSION: Technically successful conversion of bilateral percutaneous nephrostomy catheters to bilateral antegrade nephroureteral catheters, capped to allow antegrade internal  drainage.   Electronically Signed   By: Arne Cleveland M.D.   On: 09/25/2013 16:57    Microbiology: Recent Results (from the past 240 hour(s))  URINE CULTURE     Status: None   Collection Time    09/17/13  4:44 PM      Result Value Range Status   Specimen Description URINE, CATHETERIZED   Final   Special Requests NONE   Final   Culture  Setup Time     Final   Value: 09/17/2013 21:43     Performed at [facility]     Final   Value: NO GROWTH     Performed at [facility]   Culture     Final   Value: NO GROWTH     Performed at [facility]   Report Status 09/18/2013 FINAL   Final     Labs: Basic Metabolic Panel:  Recent Labs Lab 09/22/13 0446 09/23/13 0418 09/24/13 0420 09/25/13 0447 09/26/13 0541  NA 143 145 144 144 144  K 3.5* 3.5* 3.9 3.6* 4.0  CL 103 104 106 105 106  CO2 27 27 29 27 26   GLUCOSE 204* 220* 184* 189* 203*  BUN 33* 27* 23 22 24*  CREATININE 2.44* 2.06* 1.85* 1.74* 1.77*  CALCIUM 8.3* 8.4 8.6 8.6 8.6  PHOS 2.0* 1.4* 1.6* 1.4* 1.5*   Liver Function Tests:  Recent Labs Lab 09/22/13 0446 09/23/13 0418 09/24/13 0420 09/25/13 0447 09/26/13 0541  ALBUMIN 2.1* 2.1* 2.2* 2.3* 2.3*   No results found for this basename: LIPASE, AMYLASE,  in the last 168 hours No results found for this basename: AMMONIA,  in the last 168 hours CBC:  Recent Labs Lab 09/22/13 0446 09/23/13 0418 09/24/13 0420 09/25/13 0447 09/26/13 0541  WBC 7.1  8.2 7.5 8.2 7.8  HGB 7.6* 7.5* 7.5* 7.5* 7.7*  HCT 23.9* 23.6* 24.0* 23.8* 24.4*  MCV 90.5 90.4 92.0 91.9 92.1  PLT 265 270 246 254 248   Cardiac Enzymes: No results found for this basename: CKTOTAL, CKMB, CKMBINDEX, TROPONINI,  in the last 168 hours BNP: BNP (last 3 results)  Recent Labs  09/01/13 0513  PROBNP 2536.0*   CBG:  Recent Labs Lab 09/25/13 1136 09/25/13 1704 09/25/13 2141 09/26/13 0800 09/26/13 1205  GLUCAP 131* 140* 185* 179* 185*       Signed:  Dia Crawford, MD [facility] [phone] pager

## 2013-09-26 NOTE — Progress Notes (Signed)
Subjective: Pt doing fairly well; walking in hallway at present; has some mild flank soreness  Objective: Vital signs in last 24 hours: Temp:  [97.6 F (36.4 C)-98.2 F (36.8 C)] 98.1 F (36.7 C) (01/16 0614) Pulse Rate:  [73-92] 92 (01/16 0924) Resp:  [13-22] 18 (01/16 0614) BP: (123-150)/(51-70) 142/53 mmHg (01/16 0924) SpO2:  [93 %-99 %] 97 % (01/16 0614) Last BM Date: 09/25/13  Intake/Output from previous day: 01/15 0701 - 01/16 0700 In: 680 [P.O.:480; IV Piggyback:200] Out: 870 [Urine:870] Intake/Output this shift: Total I/O In: 240 [P.O.:240] Out: 450 [Urine:450]  bilat PCN's intact and capped, dressings dry, sites with minimal tenderness, foley with dark red urine in bag  Lab Results:   Recent Labs  09/25/13 0447 09/26/13 0541  WBC 8.2 7.8  HGB 7.5* 7.7*  HCT 23.8* 24.4*  PLT 254 248   BMET  Recent Labs  09/25/13 0447 09/26/13 0541  NA 144 144  K 3.6* 4.0  CL 105 106  CO2 27 26  GLUCOSE 189* 203*  BUN 22 24*  CREATININE 1.74* 1.77*  CALCIUM 8.6 8.6   PT/INR No results found for this basename: LABPROT, INR,  in the last 72 hours ABG No results found for this basename: PHART, PCO2, PO2, HCO3,  in the last 72 hours  Studies/Results: Ir Nephrostogram Left  09/25/2013   CLINICAL DATA:  Prostate carcinoma with bilateral ureteral obstruction, post bilateral percutaneous nephrostomy catheter placement. Antegrade nephro ureteral stents are requested to allow trial of antegrade drainage.  EXAM: CONVERSION OF BILATERAL PERCUTANEOUS NEPHROSTOMY CATHETERS TO PERCUTANEOUS ANTEGRADE NEPHROURETERAL CATHETERS UNDER FLUOROSCOPY  TECHNIQUE: The procedure, risks (including but not limited to bleeding, infection, organ damage), benefits, and alternatives were explained to the patient. Questions regarding the procedure were encouraged and answered. The patient understands and consents to the procedure.  The previously placed nephrostomy catheters and surrounding skin  prepped and draped usual sterile fashion. Maximal barrier sterile technique was utilized including caps, mask, sterile gowns, sterile gloves, sterile drape, hand hygiene and skin antiseptic.  The patient was already receiving adequate prophylactic antibiotic coverage.  The skin around the tubes was infiltrated with 1% lidocaine.  Intravenous Fentanyl and Versed were administered as conscious sedation during continuous cardiorespiratory monitoring by the radiology RN, with a total moderate sedation time of 40 minutes.  A small amount of water-soluble contrast was injected through the right nephrostomy catheter to opacify the right renal collecting system. The catheter was cut and exchanged over a Benson wire for a 5-French Kumpe catheter, advanced into the urinary bladder. The Kumpe was exchanged over an Amplatz wire for a 26 cm 10 French nephroureteral catheter, positioned with the distal end in the urinary bladder, proximal loop in the central aspect of the renal collecting system. Contrast injection confirmed appropriate positioning and patency. Catheter was capped to allow internal antegrade drainage.  In similar fashion, a small amount of water-soluble contrast was injected through the left nephrostomy catheter to opacify the left renal collecting system. The catheter was cut and exchanged over a Benson wire for a 5-French Kumpe catheter, advanced into the urinary bladder. The Kumpe was exchanged over an Amplatz wire for a 26 cm 10 French nephroureteral catheter, positioned with the distal end in the urinary bladder, proximal loop in the central renal collecting system. Contrast injection confirmed appropriate positioning and patency. Catheter was capped. The patient tolerated the procedure well. No immediate complication.  COMPARISON:  09/18/2013  IMPRESSION: Technically successful conversion of bilateral percutaneous nephrostomy catheters to bilateral  antegrade nephroureteral catheters, capped to allow  antegrade internal drainage.   Electronically Signed   By: Arne Cleveland M.D.   On: 09/25/2013 16:57   Ir Nephrostogram Right  09/25/2013   CLINICAL DATA:  Prostate carcinoma with bilateral ureteral obstruction, post bilateral percutaneous nephrostomy catheter placement. Antegrade nephro ureteral stents are requested to allow trial of antegrade drainage.  EXAM: CONVERSION OF BILATERAL PERCUTANEOUS NEPHROSTOMY CATHETERS TO PERCUTANEOUS ANTEGRADE NEPHROURETERAL CATHETERS UNDER FLUOROSCOPY  TECHNIQUE: The procedure, risks (including but not limited to bleeding, infection, organ damage), benefits, and alternatives were explained to the patient. Questions regarding the procedure were encouraged and answered. The patient understands and consents to the procedure.  The previously placed nephrostomy catheters and surrounding skin prepped and draped usual sterile fashion. Maximal barrier sterile technique was utilized including caps, mask, sterile gowns, sterile gloves, sterile drape, hand hygiene and skin antiseptic.  The patient was already receiving adequate prophylactic antibiotic coverage.  The skin around the tubes was infiltrated with 1% lidocaine.  Intravenous Fentanyl and Versed were administered as conscious sedation during continuous cardiorespiratory monitoring by the radiology RN, with a total moderate sedation time of 40 minutes.  A small amount of water-soluble contrast was injected through the right nephrostomy catheter to opacify the right renal collecting system. The catheter was cut and exchanged over a Benson wire for a 5-French Kumpe catheter, advanced into the urinary bladder. The Kumpe was exchanged over an Amplatz wire for a 26 cm 10 French nephroureteral catheter, positioned with the distal end in the urinary bladder, proximal loop in the central aspect of the renal collecting system. Contrast injection confirmed appropriate positioning and patency. Catheter was capped to allow internal  antegrade drainage.  In similar fashion, a small amount of water-soluble contrast was injected through the left nephrostomy catheter to opacify the left renal collecting system. The catheter was cut and exchanged over a Benson wire for a 5-French Kumpe catheter, advanced into the urinary bladder. The Kumpe was exchanged over an Amplatz wire for a 26 cm 10 French nephroureteral catheter, positioned with the distal end in the urinary bladder, proximal loop in the central renal collecting system. Contrast injection confirmed appropriate positioning and patency. Catheter was capped. The patient tolerated the procedure well. No immediate complication.  COMPARISON:  09/18/2013  IMPRESSION: Technically successful conversion of bilateral percutaneous nephrostomy catheters to bilateral antegrade nephroureteral catheters, capped to allow antegrade internal drainage.   Electronically Signed   By: Arne Cleveland M.D.   On: 09/25/2013 16:57   Ir Patrice Paradise Colman Cater Cath Perc Left  09/25/2013   CLINICAL DATA:  Prostate carcinoma with bilateral ureteral obstruction, post bilateral percutaneous nephrostomy catheter placement. Antegrade nephro ureteral stents are requested to allow trial of antegrade drainage.  EXAM: CONVERSION OF BILATERAL PERCUTANEOUS NEPHROSTOMY CATHETERS TO PERCUTANEOUS ANTEGRADE NEPHROURETERAL CATHETERS UNDER FLUOROSCOPY  TECHNIQUE: The procedure, risks (including but not limited to bleeding, infection, organ damage), benefits, and alternatives were explained to the patient. Questions regarding the procedure were encouraged and answered. The patient understands and consents to the procedure.  The previously placed nephrostomy catheters and surrounding skin prepped and draped usual sterile fashion. Maximal barrier sterile technique was utilized including caps, mask, sterile gowns, sterile gloves, sterile drape, hand hygiene and skin antiseptic.  The patient was already receiving adequate prophylactic antibiotic  coverage.  The skin around the tubes was infiltrated with 1% lidocaine.  Intravenous Fentanyl and Versed were administered as conscious sedation during continuous cardiorespiratory monitoring by the radiology RN, with a total moderate sedation  time of 40 minutes.  A small amount of water-soluble contrast was injected through the right nephrostomy catheter to opacify the right renal collecting system. The catheter was cut and exchanged over a Benson wire for a 5-French Kumpe catheter, advanced into the urinary bladder. The Kumpe was exchanged over an Amplatz wire for a 26 cm 10 French nephroureteral catheter, positioned with the distal end in the urinary bladder, proximal loop in the central aspect of the renal collecting system. Contrast injection confirmed appropriate positioning and patency. Catheter was capped to allow internal antegrade drainage.  In similar fashion, a small amount of water-soluble contrast was injected through the left nephrostomy catheter to opacify the left renal collecting system. The catheter was cut and exchanged over a Benson wire for a 5-French Kumpe catheter, advanced into the urinary bladder. The Kumpe was exchanged over an Amplatz wire for a 26 cm 10 French nephroureteral catheter, positioned with the distal end in the urinary bladder, proximal loop in the central renal collecting system. Contrast injection confirmed appropriate positioning and patency. Catheter was capped. The patient tolerated the procedure well. No immediate complication.  COMPARISON:  09/18/2013  IMPRESSION: Technically successful conversion of bilateral percutaneous nephrostomy catheters to bilateral antegrade nephroureteral catheters, capped to allow antegrade internal drainage.   Electronically Signed   By: Arne Cleveland M.D.   On: 09/25/2013 16:57   Ir Patrice Paradise Colman Cater Cath Perc Right  09/25/2013   CLINICAL DATA:  Prostate carcinoma with bilateral ureteral obstruction, post bilateral percutaneous nephrostomy  catheter placement. Antegrade nephro ureteral stents are requested to allow trial of antegrade drainage.  EXAM: CONVERSION OF BILATERAL PERCUTANEOUS NEPHROSTOMY CATHETERS TO PERCUTANEOUS ANTEGRADE NEPHROURETERAL CATHETERS UNDER FLUOROSCOPY  TECHNIQUE: The procedure, risks (including but not limited to bleeding, infection, organ damage), benefits, and alternatives were explained to the patient. Questions regarding the procedure were encouraged and answered. The patient understands and consents to the procedure.  The previously placed nephrostomy catheters and surrounding skin prepped and draped usual sterile fashion. Maximal barrier sterile technique was utilized including caps, mask, sterile gowns, sterile gloves, sterile drape, hand hygiene and skin antiseptic.  The patient was already receiving adequate prophylactic antibiotic coverage.  The skin around the tubes was infiltrated with 1% lidocaine.  Intravenous Fentanyl and Versed were administered as conscious sedation during continuous cardiorespiratory monitoring by the radiology RN, with a total moderate sedation time of 40 minutes.  A small amount of water-soluble contrast was injected through the right nephrostomy catheter to opacify the right renal collecting system. The catheter was cut and exchanged over a Benson wire for a 5-French Kumpe catheter, advanced into the urinary bladder. The Kumpe was exchanged over an Amplatz wire for a 26 cm 10 French nephroureteral catheter, positioned with the distal end in the urinary bladder, proximal loop in the central aspect of the renal collecting system. Contrast injection confirmed appropriate positioning and patency. Catheter was capped to allow internal antegrade drainage.  In similar fashion, a small amount of water-soluble contrast was injected through the left nephrostomy catheter to opacify the left renal collecting system. The catheter was cut and exchanged over a Benson wire for a 5-French Kumpe catheter,  advanced into the urinary bladder. The Kumpe was exchanged over an Amplatz wire for a 26 cm 10 French nephroureteral catheter, positioned with the distal end in the urinary bladder, proximal loop in the central renal collecting system. Contrast injection confirmed appropriate positioning and patency. Catheter was capped. The patient tolerated the procedure well. No immediate complication.  COMPARISON:  09/18/2013  IMPRESSION: Technically successful conversion of bilateral percutaneous nephrostomy catheters to bilateral antegrade nephroureteral catheters, capped to allow antegrade internal drainage.   Electronically Signed   By: Arne Cleveland M.D.   On: 09/25/2013 16:57    Anti-infectives: Anti-infectives   Start     Dose/Rate Route Frequency Ordered Stop   09/25/13 0930  ciprofloxacin (CIPRO) IVPB 400 mg    Comments:  Hang ON CALL to xray 1/15   400 mg 200 mL/hr over 60 Minutes Intravenous  Once 09/25/13 0852 09/25/13 1549   09/18/13 1030  ceFAZolin (ANCEF) IVPB 2 g/50 mL premix    Comments:  On call to IR for procedure 09/18/13   2 g 100 mL/hr over 30 Minutes Intravenous  Once 09/18/13 1010 09/18/13 1130      Assessment/Plan: s/p bilat PCN's 1/8, ureteral stents 1/15 secondary to obstructive hydro/ARF from prostate ca; plans as per urology/IM  LOS: 9 days    Joniqua Sidle,D [facility] 09/26/2013

## 2013-09-26 NOTE — Progress Notes (Signed)
[facility] Therapy Treatment Record Phone [phone]   Radiation Therapy was administered to Samuel Garner on: 09/26/2013  11:30 AM and was treatment # 5 out of a planned course of 10 treatments.

## 2013-09-26 NOTE — Progress Notes (Signed)
64M Admitted on 09/17/13 for gross hematuria, acute on chronic renal failure, anemia, and hyperkalemia.  Patient recently diagnosed with aggressive prostate cancer and will be started on a course of external beam radiation for control of local symptoms - bleeding.  Oncology will re-evaluate once creatinine nadirs.  Interval: nephro-ureteral stents placed, capped at skin. Urine in foley is dark brown - likely old blood.  Filed Vitals:   09/25/13 1816 09/25/13 2040 09/26/13 0614 09/26/13 0924  BP: 141/51 146/60 147/67 142/53  Pulse: 85 86 75 92  Temp: 97.7 F (36.5 C) 98 F (36.7 C) 98.1 F (36.7 C)   TempSrc: Oral Oral Oral   Resp: 16 18 18    Height:      Weight:      SpO2: 99% 96% 97%    NAD Abdomen is soft Foley catheter dark bloody drainage.   Recent Labs  09/24/13 0420 09/25/13 0447 09/26/13 0541  WBC 7.5 8.2 7.8  HGB 7.5* 7.5* 7.7*  HCT 24.0* 23.8* 24.4*    Recent Labs  09/24/13 0420 09/25/13 0447 09/26/13 0541  NA 144 144 144  K 3.9 3.6* 4.0  CL 106 105 106  CO2 29 27 26   GLUCOSE 184* 189* 203*  BUN 23 22 24*  CREATININE 1.85* 1.74* 1.77*  CALCIUM 8.6 8.6 8.6   No results found for this basename: LABPT, INR,  in the last 72 hours No results found for this basename: LABURIN,  in the last 72 hours Results for orders placed during the hospital encounter of 09/17/13  URINE CULTURE     Status: None   Collection Time    09/17/13  4:44 PM      Result Value Range Status   Specimen Description URINE, CATHETERIZED   Final   Special Requests NONE   Final   Culture  Setup Time     Final   Value: 09/17/2013 21:43     Performed at [facility]     Final   Value: NO GROWTH     Performed at [facility]   Culture     Final   Value: NO GROWTH     Performed at [facility]   Report Status 09/18/2013 FINAL   Final   Imp: Aggressive CaP resulting in gross hematuria - still requires catheter, if for nothing else it is helping  with hemostasis. Anemia secondary to acute blood loss and chronic disease Acute on chronic renal failure - likely component of pre-renal from reduced hemoglobin and obstructive from prostate DM/CAD/PVD, but is improving.  Recommendations: Keep neph tubes capped/protected at skin Leave foley in and to gravity drainage. I will schedule patient for foley removal early next week in clinic.  I am off this weekend, someone from [facility] will be available on an as needed basis.

## 2013-09-26 NOTE — Progress Notes (Signed)
Spoke with pt's wife via phone. She selected [facility]. A call to [facility] revealed that they do not take new clients after hours, related to checking to see if pt qualify, PCP following pt. However, pt can transition to them from [facility]. A call back to pt's wife to explain this information. She understood and selected [place] for [facility]. Referral was given to in house rep with [facility].

## 2013-09-26 NOTE — Progress Notes (Signed)
Spoke with pt and wife about OT evaluation.  Wife has been assisting pt with adls for some time now and is comfortable with this set up. Pt was observed in hallway walking with wife.  No OT needs at this time per OT adn family discussion. Jinger Neighbors, [place] [phone]

## 2013-09-26 NOTE — Progress Notes (Signed)
Patient discharged home with wife/daughter. Discharge instructions given and explained to patient/family and they verbalized understanding. Patient denies any pain/distress. Bil surgical incision site clean dry intact dress. Discharged with foley, draining clear bloody urine, F/U out patient with urology. No other wound noted. Accompanied home by wife and daughter.

## 2013-09-29 ENCOUNTER — Ambulatory Visit
Admit: 2013-09-29 | Discharge: 2013-09-29 | Disposition: A | Discharge status: Home / Self Care | Payer: Medicare Other | Attending: Radiation Oncology | Admitting: Radiation Oncology

## 2013-09-30 ENCOUNTER — Ambulatory Visit
Admit: 2013-09-30 | Discharge: 2013-09-30 | Disposition: A | Discharge status: Home / Self Care | Payer: Medicare Other | Attending: Radiation Oncology | Admitting: Radiation Oncology

## 2013-09-30 NOTE — Addendum Note (Signed)
Encounter addended by: Heywood Footman, RN on: 09/30/2013 11:06 AM<BR>     Documentation filed: Inpatient Document Flowsheet, Inpatient Patient Education, Notes Section, Chief Complaint Section

## 2013-10-01 ENCOUNTER — Ambulatory Visit
Admit: 2013-10-01 | Discharge: 2013-10-01 | Disposition: A | Discharge status: Home / Self Care | Payer: Medicare Other | Attending: Radiation Oncology | Admitting: Radiation Oncology

## 2013-10-02 ENCOUNTER — Ambulatory Visit
Admit: 2013-10-02 | Discharge: 2013-10-02 | Disposition: A | Discharge status: Home / Self Care | Payer: Medicare Other | Attending: Radiation Oncology | Admitting: Radiation Oncology

## 2013-10-03 ENCOUNTER — Encounter: Payer: Self-pay | Admitting: Radiation Oncology

## 2013-10-03 ENCOUNTER — Ambulatory Visit
Admission: RE | Admit: 2013-10-03 | Discharge: 2013-10-03 | Disposition: A | Discharge status: Home / Self Care | Payer: Medicare Other | Source: Ambulatory Visit | Attending: Radiation Oncology | Admitting: Radiation Oncology

## 2013-10-03 ENCOUNTER — Ambulatory Visit: Admission: RE | Admit: 2013-10-03 | Payer: Medicare Other | Source: Ambulatory Visit | Admitting: Radiation Oncology

## 2013-10-03 VITALS — BP 132/56 | HR 86

## 2013-10-03 DIAGNOSIS — C61 Malignant neoplasm of prostate: Secondary | ICD-10-CM

## 2013-10-03 NOTE — Progress Notes (Signed)
  Radiation Oncology         [phone] ________________________________  Name: Samuel Garner MRN: [id number]  Date: 10/03/2013  DOB: 1932/10/09  End of Treatment Note  Diagnosis:   80 y.o. 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  Narrative: The patient tolerated radiation treatment relatively well.   His Foley output became slightly less bloody and more blood-tinged during radiation.  Plan: The patient has completed radiation treatment. The patient will return to radiation oncology clinic for routine followup in one month. I advised them to call or return sooner if they have any questions or concerns related to their recovery or treatment. ________________________________  Sheral Apley. Tammi Klippel, M.D.

## 2013-10-03 NOTE — Progress Notes (Signed)
Samuel Garner has had 10 fractions to his bladder.  He is in a wheelchair today.  He denies pain.  He has a foley catheter. It is draining dark, red urine. He should have the foley dc'd next week per Dr. Louis Meckel.  He denies diarrhea.  He states his fatigue is improving.  He has lost 9 lbs since 1/13.   He reports that he is not hungry.  Will set him up with a dietician appointment.

## 2013-10-05 ENCOUNTER — Encounter: Payer: Self-pay | Admitting: Radiation Oncology

## 2013-10-05 NOTE — Progress Notes (Signed)
  Radiation Oncology         [phone] ________________________________  Name: Samuel Garner MRN: [id number]  Date: 10/03/2013  DOB: 05-21-1933  Weekly Radiation Therapy Management  Current Dose: 30 Gy     Planned Dose:  30 Gy  Narrative . . . . . . . . The patient presents for the final under treatment assessment.                                           The patient has had some continuation of previously noted symptoms.                                 Set-up films were reviewed.                                 The chart was checked. Physical Findings. . . Weight essentially stable.  No significant changes. Impression . . . . . . . The patient tolerated radiation relatively well. Plan . . . . . . . . . . . . Complete radiation today as scheduled, and follow-up in one month. The patient was encouraged to call or return to the clinic in the interim for any worsening symptoms.  ________________________________  Sheral Apley Tammi Klippel, M.D.

## 2013-10-06 ENCOUNTER — Other Ambulatory Visit: Payer: Self-pay | Admitting: Oncology

## 2013-10-06 ENCOUNTER — Telehealth: Payer: Self-pay | Admitting: *Deleted

## 2013-10-06 DIAGNOSIS — C61 Malignant neoplasm of prostate: Secondary | ICD-10-CM

## 2013-10-06 NOTE — Telephone Encounter (Signed)
Called patient to inform of  Appt. With the nutritionist on 10-14-13 at 9:00 am, spoke with patient and he is aware of this appt. And he said that it was good.

## 2013-10-07 ENCOUNTER — Other Ambulatory Visit (HOSPITAL_COMMUNITY): Payer: Self-pay | Admitting: Urology

## 2013-10-07 ENCOUNTER — Telehealth: Payer: Self-pay | Admitting: *Deleted

## 2013-10-07 DIAGNOSIS — C61 Malignant neoplasm of prostate: Secondary | ICD-10-CM

## 2013-10-07 DIAGNOSIS — N179 Acute kidney failure, unspecified: Secondary | ICD-10-CM

## 2013-10-07 NOTE — Telephone Encounter (Signed)
sw pt spouse gv appt for 10/17/13 w/labs@ 3:30pm and ov @ 4pm. She is aware of her husbands appt...td

## 2013-10-09 ENCOUNTER — Other Ambulatory Visit: Payer: Self-pay | Admitting: Oncology

## 2013-10-10 ENCOUNTER — Telehealth: Payer: Self-pay | Admitting: Oncology

## 2013-10-10 NOTE — Telephone Encounter (Signed)
per 1/30 pof moved 2/6 appt to 2/5 @ 3:30pm (hosp f/u).

## 2013-10-14 ENCOUNTER — Encounter: Payer: Medicare Other | Admitting: Nutrition

## 2013-10-14 ENCOUNTER — Encounter: Payer: Self-pay | Admitting: Nutrition

## 2013-10-14 NOTE — Progress Notes (Signed)
Patient canceled nutrition appointment.  He did not reschedule at this time.

## 2013-10-15 ENCOUNTER — Other Ambulatory Visit (HOSPITAL_COMMUNITY): Payer: Self-pay | Admitting: Urology

## 2013-10-15 ENCOUNTER — Telehealth (HOSPITAL_COMMUNITY): Payer: Self-pay | Admitting: Radiology

## 2013-10-15 DIAGNOSIS — N179 Acute kidney failure, unspecified: Secondary | ICD-10-CM

## 2013-10-15 DIAGNOSIS — C61 Malignant neoplasm of prostate: Secondary | ICD-10-CM

## 2013-10-15 NOTE — Telephone Encounter (Signed)
Received call from Dr. Louis Meckel to go over the plan for Mr. Cahn for his 10-21-13 appointment with IR.  Dr. Louis Meckel would like to replace the current nephroureteral stents with new JJ stents and PCNs that are to be capped.  Dr. Louis Meckel would like IR to bring the patient back one week later for nephrostograms and possible nephrostomy removals.

## 2013-10-16 ENCOUNTER — Ambulatory Visit (HOSPITAL_BASED_OUTPATIENT_CLINIC_OR_DEPARTMENT_OTHER): Payer: Medicare Other

## 2013-10-16 ENCOUNTER — Telehealth: Payer: Self-pay | Admitting: Oncology

## 2013-10-16 ENCOUNTER — Encounter: Payer: Self-pay | Admitting: Oncology

## 2013-10-16 ENCOUNTER — Other Ambulatory Visit (HOSPITAL_BASED_OUTPATIENT_CLINIC_OR_DEPARTMENT_OTHER): Payer: Medicare Other

## 2013-10-16 ENCOUNTER — Ambulatory Visit (HOSPITAL_BASED_OUTPATIENT_CLINIC_OR_DEPARTMENT_OTHER): Payer: Medicare Other | Admitting: Oncology

## 2013-10-16 VITALS — BP 126/70 | HR 75 | Temp 96.6°F | Resp 19 | Ht 69.0 in | Wt 212.5 lb

## 2013-10-16 DIAGNOSIS — C61 Malignant neoplasm of prostate: Secondary | ICD-10-CM

## 2013-10-16 DIAGNOSIS — C7A098 Malignant carcinoid tumors of other sites: Secondary | ICD-10-CM

## 2013-10-16 DIAGNOSIS — N19 Unspecified kidney failure: Secondary | ICD-10-CM

## 2013-10-16 LAB — CBC WITH DIFFERENTIAL/PLATELET
BASO%: 0.2 % (ref 0.0–2.0)
Basophils Absolute: 0 10*3/uL (ref 0.0–0.1)
EOS%: 0.5 % (ref 0.0–7.0)
Eosinophils Absolute: 0 10*3/uL (ref 0.0–0.5)
HCT: 25.8 % — ABNORMAL LOW (ref 38.4–49.9)
HEMOGLOBIN: 8.5 g/dL — AB (ref 13.0–17.1)
LYMPH#: 0.3 10*3/uL — AB (ref 0.9–3.3)
LYMPH%: 4.7 % — ABNORMAL LOW (ref 14.0–49.0)
MCH: 28.8 pg (ref 27.2–33.4)
MCHC: 33 g/dL (ref 32.0–36.0)
MCV: 87.2 fL (ref 79.3–98.0)
MONO#: 0.2 10*3/uL (ref 0.1–0.9)
MONO%: 3.5 % (ref 0.0–14.0)
NEUT#: 5.3 10*3/uL (ref 1.5–6.5)
NEUT%: 91.1 % — AB (ref 39.0–75.0)
Platelets: 389 10*3/uL (ref 140–400)
RBC: 2.96 10*6/uL — ABNORMAL LOW (ref 4.20–5.82)
RDW: 16.5 % — AB (ref 11.0–14.6)
WBC: 5.8 10*3/uL (ref 4.0–10.3)

## 2013-10-16 LAB — COMPREHENSIVE METABOLIC PANEL (CC13)
ALBUMIN: 2.1 g/dL — AB (ref 3.5–5.0)
ALT: 31 U/L (ref 0–55)
ANION GAP: 15 meq/L — AB (ref 3–11)
AST: 38 U/L — ABNORMAL HIGH (ref 5–34)
Alkaline Phosphatase: 113 U/L (ref 40–150)
BUN: 91.9 mg/dL — AB (ref 7.0–26.0)
CHLORIDE: 99 meq/L (ref 98–109)
CO2: 21 meq/L — AB (ref 22–29)
CREATININE: 4.7 mg/dL — AB (ref 0.7–1.3)
Calcium: 9.6 mg/dL (ref 8.4–10.4)
Glucose: 274 mg/dl — ABNORMAL HIGH (ref 70–140)
POTASSIUM: 4.4 meq/L (ref 3.5–5.1)
Sodium: 135 mEq/L — ABNORMAL LOW (ref 136–145)
Total Bilirubin: 0.55 mg/dL (ref 0.20–1.20)
Total Protein: 6.5 g/dL (ref 6.4–8.3)

## 2013-10-16 NOTE — Progress Notes (Signed)
Checked in new patient with no financial issues. He has appt card. °

## 2013-10-16 NOTE — Telephone Encounter (Signed)
gv adn printed appt scehd adn avs for pt for March...gv pt barim

## 2013-10-16 NOTE — Progress Notes (Signed)
Hematology and Oncology Follow Up Visit  Samuel Garner [id number] 1933/05/14 80 y.o. 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   Prior Therapy: He is status post pelvic radiation between 09/22/2013 to 10/03/2013. He received a total of 30 gray in 10 fractions under the care of Dr. Tammi Klippel.  Current therapy: Observation and surveillance.  Interim History: Samuel Garner presents today for a followup visit. He is a pleasant gentleman I saw in consultation in January of 2015. He presented with obstructive uropathy and found to have a large pelvic mass arising from the prostate and a biopsy confirmed the presence of small cell cancer probably originating from the prostate. After a prolonged hospitalization in January of 2015, he was discharged home and completed his radiation therapy as mentioned on 10/03/2013. He has percutaneous nephrostomy tube which will be internalized in the future. He tolerated the radiation therapy without any major incident. Unfortunately his overall performance status continued to decline. His activity level is very limited and he is limited to a chair for the most part. He has not reported any bleeding or any discomfort.  Medications: I have reviewed the patient's current medications.  Current Outpatient Prescriptions  Medication Sig Dispense Refill  . acetaminophen (TYLENOL) 500 MG tablet Take 500-1,000 mg by mouth every 6 (six) hours as needed for moderate pain or headache.       Marland Kitchen aspirin EC 81 MG tablet Take 81 mg by mouth daily.      . calcitRIOL (ROCALTROL) 0.25 MCG capsule Take 0.25 mcg by mouth every Monday, Wednesday, and Friday.       . Cholecalciferol (VITAMIN D3) 1000 UNITS CAPS Take 1,000 Units by mouth every morning.       . feeding supplement, ENSURE, (ENSURE) PUDG Take 1  Container by mouth daily.  30 Can  0  . fenofibrate micronized (ANTARA) 43 MG capsule Take 43 mg by mouth every morning.       . furosemide (LASIX) 40 MG tablet Take 40 mg by mouth every morning.      Marland Kitchen HYDROcodone-acetaminophen (NORCO/VICODIN) 5-325 MG per tablet Take 1-2 tablets by mouth every 4 (four) hours as needed for moderate pain.  30 tablet  0  . metoprolol (LOPRESSOR) 100 MG tablet Take 100 mg by mouth 2 (two) times daily.      . Multiple Vitamins-Minerals (PRESERVISION/LUTEIN) CAPS Take 1 capsule by mouth at bedtime.       . niacin 500 MG tablet Take 500 mg by mouth at bedtime.      . pioglitazone (ACTOS) 30 MG tablet Take 30 mg by mouth every morning.       . pravastatin (PRAVACHOL) 40 MG tablet Take 40 mg by mouth at bedtime.      . verapamil (CALAN-SR) 120 MG CR tablet Take 1 tablet (120 mg total) by mouth at bedtime.  30 tablet  11   No current facility-administered medications for this visit.     Allergies:  Allergies  Allergen Reactions  . Lisinopril     REACTION: acute renal failure: Patient is not familiar with this medication or an allergy    Past Medical History, Surgical history, Social history, and Family History were reviewed and updated.  Review of Systems: Constitutional:  Negative for fever, chills, night sweats, anorexia, weight loss, pain.  Remaining ROS negative. Physical Exam: Blood pressure 126/70,  pulse 75, temperature 96.6 F (35.9 C), temperature source Oral, resp. rate 19, height 5\' 9"  (1.753 m), weight 212 lb 8 oz (96.389 kg). ECOG:3  General appearance: alert and cooperative Head: Normocephalic, without obvious abnormality, atraumatic Neck: no adenopathy, no carotid bruit, no JVD, supple, symmetrical, trachea midline and thyroid not enlarged, symmetric, no tenderness/mass/nodules Lymph nodes: Cervical, supraclavicular, and axillary nodes normal. Heart:regular rate and rhythm, S1, S2 normal, no murmur, click, rub or gallop Lung:chest clear, no  wheezing, rales, normal symmetric air entry Abdomin: soft, non-tender, without masses or organomegaly EXT:no erythema, induration, or nodules   Lab Results: Lab Results  Component Value Date   WBC 5.8 10/16/2013   HGB 8.5* 10/16/2013   HCT 25.8* 10/16/2013   MCV 87.2 10/16/2013   PLT 389 10/16/2013     Chemistry      Component Value Date/Time   NA 144 09/26/2013 0541   K 4.0 09/26/2013 0541   CL 106 09/26/2013 0541   CO2 26 09/26/2013 0541   BUN 24* 09/26/2013 0541   CREATININE 1.77* 09/26/2013 0541   CREATININE 2.39* 01/28/2013 1133      Component Value Date/Time   CALCIUM 8.6 09/26/2013 0541   ALKPHOS 46 09/18/2013 0412   AST 9 09/18/2013 0412   ALT 6 09/18/2013 0412   BILITOT 0.2* 09/18/2013 0412       Impression and Plan:   80 year old gentleman with the following issues:  1. Locally advanced prostate cancer presented with a pelvic tumor with the pathology confirmed the presence of poorly differentiated cancer with small cell features. He is status post radiation therapy and currently under observation. I discussed the treatment options with him at this time and I still do not think his performance status and overall kidney function allows for administration of completely effective chemotherapy. My plan at this point, is to restage him in a few months and we'll assess his clinical status at that time. I am very skeptical about his ability to withstand any further therapy I think he is heading towards hospice care sooner rather than later.  2. Renal failure: He has an element of obstructive uropathy but his baseline kidney function is poor to start with which makes his prognosis even worse at this time.  3. Followup: Will be in March of 2015 after repeating imaging studies.   [facility], MD 2/5/20154:24 PM

## 2013-10-17 ENCOUNTER — Encounter (HOSPITAL_COMMUNITY): Payer: Self-pay | Admitting: Pharmacy Technician

## 2013-10-17 ENCOUNTER — Other Ambulatory Visit: Payer: Self-pay | Admitting: Radiology

## 2013-10-17 ENCOUNTER — Other Ambulatory Visit: Payer: Medicare Other

## 2013-10-17 ENCOUNTER — Ambulatory Visit: Payer: Medicare Other

## 2013-10-17 ENCOUNTER — Inpatient Hospital Stay: Payer: Medicare Other | Admitting: Oncology

## 2013-10-21 ENCOUNTER — Other Ambulatory Visit (HOSPITAL_COMMUNITY): Payer: Self-pay | Admitting: Urology

## 2013-10-21 ENCOUNTER — Ambulatory Visit (HOSPITAL_COMMUNITY)
Admission: RE | Admit: 2013-10-21 | Discharge: 2013-10-21 | Disposition: A | Discharge status: Home / Self Care | Payer: Medicare Other | Source: Ambulatory Visit | Attending: Urology | Admitting: Urology

## 2013-10-21 ENCOUNTER — Encounter (HOSPITAL_COMMUNITY): Payer: Self-pay

## 2013-10-21 DIAGNOSIS — Z9079 Acquired absence of other genital organ(s): Secondary | ICD-10-CM | POA: Insufficient documentation

## 2013-10-21 DIAGNOSIS — Y833 Surgical operation with formation of external stoma as the cause of abnormal reaction of the patient, or of later complication, without mention of misadventure at the time of the procedure: Secondary | ICD-10-CM | POA: Insufficient documentation

## 2013-10-21 DIAGNOSIS — N179 Acute kidney failure, unspecified: Secondary | ICD-10-CM | POA: Insufficient documentation

## 2013-10-21 DIAGNOSIS — I2510 Atherosclerotic heart disease of native coronary artery without angina pectoris: Secondary | ICD-10-CM | POA: Insufficient documentation

## 2013-10-21 DIAGNOSIS — N135 Crossing vessel and stricture of ureter without hydronephrosis: Secondary | ICD-10-CM | POA: Insufficient documentation

## 2013-10-21 DIAGNOSIS — Z794 Long term (current) use of insulin: Secondary | ICD-10-CM | POA: Insufficient documentation

## 2013-10-21 DIAGNOSIS — C61 Malignant neoplasm of prostate: Secondary | ICD-10-CM | POA: Insufficient documentation

## 2013-10-21 DIAGNOSIS — Z79899 Other long term (current) drug therapy: Secondary | ICD-10-CM | POA: Insufficient documentation

## 2013-10-21 DIAGNOSIS — Z87891 Personal history of nicotine dependence: Secondary | ICD-10-CM | POA: Insufficient documentation

## 2013-10-21 DIAGNOSIS — R300 Dysuria: Secondary | ICD-10-CM | POA: Insufficient documentation

## 2013-10-21 DIAGNOSIS — IMO0002 Reserved for concepts with insufficient information to code with codable children: Secondary | ICD-10-CM | POA: Insufficient documentation

## 2013-10-21 DIAGNOSIS — E119 Type 2 diabetes mellitus without complications: Secondary | ICD-10-CM | POA: Insufficient documentation

## 2013-10-21 DIAGNOSIS — E785 Hyperlipidemia, unspecified: Secondary | ICD-10-CM | POA: Insufficient documentation

## 2013-10-21 DIAGNOSIS — N189 Chronic kidney disease, unspecified: Secondary | ICD-10-CM | POA: Insufficient documentation

## 2013-10-21 DIAGNOSIS — N9989 Other postprocedural complications and disorders of genitourinary system: Secondary | ICD-10-CM | POA: Insufficient documentation

## 2013-10-21 DIAGNOSIS — R109 Unspecified abdominal pain: Secondary | ICD-10-CM | POA: Insufficient documentation

## 2013-10-21 DIAGNOSIS — I129 Hypertensive chronic kidney disease with stage 1 through stage 4 chronic kidney disease, or unspecified chronic kidney disease: Secondary | ICD-10-CM | POA: Insufficient documentation

## 2013-10-21 DIAGNOSIS — I252 Old myocardial infarction: Secondary | ICD-10-CM | POA: Insufficient documentation

## 2013-10-21 DIAGNOSIS — N2581 Secondary hyperparathyroidism of renal origin: Secondary | ICD-10-CM | POA: Insufficient documentation

## 2013-10-21 LAB — CBC WITH DIFFERENTIAL/PLATELET
BASOS ABS: 0 10*3/uL (ref 0.0–0.1)
BASOS PCT: 0 % (ref 0–1)
EOS ABS: 0.1 10*3/uL (ref 0.0–0.7)
Eosinophils Relative: 1 % (ref 0–5)
HCT: 28.2 % — ABNORMAL LOW (ref 39.0–52.0)
HEMOGLOBIN: 8.9 g/dL — AB (ref 13.0–17.0)
LYMPHS PCT: 6 % — AB (ref 12–46)
Lymphs Abs: 0.4 10*3/uL — ABNORMAL LOW (ref 0.7–4.0)
MCH: 27.8 pg (ref 26.0–34.0)
MCHC: 31.6 g/dL (ref 30.0–36.0)
MCV: 88.1 fL (ref 78.0–100.0)
MONO ABS: 0.4 10*3/uL (ref 0.1–1.0)
Monocytes Relative: 7 % (ref 3–12)
NEUTROS PCT: 86 % — AB (ref 43–77)
Neutro Abs: 5.3 10*3/uL (ref 1.7–7.7)
Platelets: 353 10*3/uL (ref 150–400)
RBC: 3.2 MIL/uL — ABNORMAL LOW (ref 4.22–5.81)
RDW: 16.2 % — AB (ref 11.5–15.5)
WBC: 6.2 10*3/uL (ref 4.0–10.5)

## 2013-10-21 LAB — PROTIME-INR
INR: 1.13 (ref 0.00–1.49)
PROTHROMBIN TIME: 14.3 s (ref 11.6–15.2)

## 2013-10-21 LAB — BASIC METABOLIC PANEL
BUN: 94 mg/dL — AB (ref 6–23)
CO2: 22 mEq/L (ref 19–32)
Calcium: 9.3 mg/dL (ref 8.4–10.5)
Chloride: 98 mEq/L (ref 96–112)
Creatinine, Ser: 4.6 mg/dL — ABNORMAL HIGH (ref 0.50–1.35)
GFR calc non Af Amer: 11 mL/min — ABNORMAL LOW (ref >90)
GFR, EST AFRICAN AMERICAN: 13 mL/min — AB (ref >90)
Glucose, Bld: 183 mg/dL — ABNORMAL HIGH (ref 70–99)
POTASSIUM: 4.6 meq/L (ref 3.7–5.3)
SODIUM: 137 meq/L (ref 137–147)

## 2013-10-21 LAB — APTT: aPTT: 33 seconds (ref 24–37)

## 2013-10-21 LAB — GLUCOSE, CAPILLARY: Glucose-Capillary: 171 mg/dL — ABNORMAL HIGH (ref 70–99)

## 2013-10-21 MED ORDER — MIDAZOLAM HCL 2 MG/2ML IJ SOLN
INTRAMUSCULAR | Status: AC
  Filled 2013-10-21: qty 6

## 2013-10-21 MED ORDER — FENTANYL CITRATE 0.05 MG/ML IJ SOLN
INTRAMUSCULAR | Status: AC
  Filled 2013-10-21: qty 6

## 2013-10-21 MED ORDER — SODIUM CHLORIDE 0.9 % IV SOLN
INTRAVENOUS | Status: DC
  Administered 2013-10-21: 08:00:00 via INTRAVENOUS

## 2013-10-21 MED ORDER — IOHEXOL 300 MG/ML  SOLN
10.0000 mL | Freq: Once | INTRAMUSCULAR | Status: AC | PRN
  Administered 2013-10-21: 15 mL

## 2013-10-21 MED ORDER — CIPROFLOXACIN IN D5W 400 MG/200ML IV SOLN: 400.0000 mg | Freq: Once | INTRAVENOUS | Status: DC

## 2013-10-21 NOTE — H&P (Signed)
Chief Complaint: "kidney tube exchange." Referring Physician: Dr. Louis Meckel HPI: Samuel Garner is an 80 y.o. male who is scheduled today for internalization of bilateral nephroureteral stents. He has a history of prostate cancer s/p bilateral nephroureteral stents placed 09/25/13 and external PCN capped at that time. Cr 1.7 on 09/26/13, upon reviewing today's labs Cr 4.6, Dr. Louis Meckel was contacted and patient will now have exchange to bilateral PCN uncapped. The patient also recently had his foley catheter removed last Monday 2/2 and placed on amoxicillin on 2/4 for complaints of suprapubic pain, dysuria, and hesitancy. He does admit to urinating daily. He states today these symptoms still persist, sometimes he feels better and other times the pain is the same as before. He denies any fever or chills. He does also complain of his left PCN leaking urine and the right PCN with occasional pus at site. He denies any chest pain, palpitations or shortness of breath. He admits to overall fatigue with no new recent change.  Past Medical History:  Past Medical History  Diagnosis Date  . Diabetes mellitus, type 2     with microalbuminuria  . Hypertension   . Arteriosclerotic cardiovascular disease (ASCVD)      2 vessel percutaneous transluminal coronary angioplasty in 3/93  . Hyperlipidemia     low HDL, high triglycerides  . Tobacco abuse, in remission     discontinued in 1993  . Gout   . Chronic kidney disease     creatinine 1.8 in 2002, 1.6 1n 2007 and 1.0 and 2008; deterioration in renal function; Secondary hyperparathyroidism  . Type IV renal tubular acidosis     With hyperkalemia  . Myocardial infarction     1993  . Small cell carcinoma of prostate     Past Surgical History:  Past Surgical History  Procedure Laterality Date  . Hernia repair    . Coronary angioplasty  1993  . Knee arthroscopy    . Transurethral resection of prostate N/A 08/29/2013    Procedure: TRANSURETHRAL RESECTION OF THE  PROSTATE (TURP) and Clot evacuation;  Surgeon: Ardis Hughs, MD;  Location: [facility];  Service: Urology;  Laterality: N/A;    Family History:  Family History  Problem Relation Age of Onset  . Diabetes Mother   . Heart attack Father     Social History:  reports that he quit smoking about 22 years ago. He has never used smokeless tobacco. He reports that he does not drink alcohol or use illicit drugs.  Allergies:  Allergies  Allergen Reactions  . Lisinopril     REACTION: acute renal failure: Patient is not familiar with this medication or an allergy    Medications:   Medication List    ASK your doctor about these medications       acetaminophen 500 MG tablet  Commonly known as:  TYLENOL  Take 500-1,000 mg by mouth every 6 (six) hours as needed for moderate pain or headache.     amoxicillin 500 MG tablet  Commonly known as:  AMOXIL  Take 500 mg by mouth 2 (two) times daily.     fenofibrate micronized 43 MG capsule  Commonly known as:  ANTARA  Take 43 mg by mouth every morning.     furosemide 40 MG tablet  Commonly known as:  LASIX  Take 40 mg by mouth every morning.     insulin glargine 100 UNIT/ML injection  Commonly known as:  LANTUS  Inject 10 Units into the skin at bedtime.  metoprolol 100 MG tablet  Commonly known as:  LOPRESSOR  Take 100 mg by mouth 2 (two) times daily.     niacin 500 MG tablet  Take 500 mg by mouth at bedtime.     pioglitazone 30 MG tablet  Commonly known as:  ACTOS  Take 30 mg by mouth every morning.     pravastatin 40 MG tablet  Commonly known as:  PRAVACHOL  Take 40 mg by mouth at bedtime.     PRESERVISION/LUTEIN Caps  Take 1 capsule by mouth at bedtime.     verapamil 120 MG CR tablet  Commonly known as:  CALAN-SR  Take 1 tablet (120 mg total) by mouth at bedtime.       Please HPI for pertinent positives, otherwise complete 10 system ROS negative.  Physical Exam: BP 113/49  Pulse 70  Temp(Src) 98 F (36.7 C)  (Oral)  Resp 16  SpO2 93% There is no weight on file to calculate BMI.  General Appearance:  Alert, cooperative, no distress  Head:  Normocephalic, without obvious abnormality, atraumatic  Neck: Supple, symmetrical, trachea midline  Lungs:   Clear to auscultation bilaterally, no w/r/r, respirations unlabored without use of accessory muscles.  Back: Right PCN dressing C/D/I, site with erythema and purulent drainage, Left PCN dressing C/with damp urine on gauze/I, site with erythema   Chest Wall:  No tenderness or deformity  Heart:  Regular rate and rhythm, S1, S2 normal, no murmur, rub or gallop.  Abdomen:   Soft, suprapubic tenderness, non distended, (+) BS  Extremities: Extremities normal, atraumatic, no cyanosis or edema  Pulses: 1+ and symmetric  Neurologic: Normal affect, no gross deficits.   Results for orders placed during the hospital encounter of 10/21/13 (from the past 48 hour(s))  APTT     Status: None   Collection Time    10/21/13  8:00 AM      Result Value Range   aPTT 33  24 - 37 seconds  BASIC METABOLIC PANEL     Status: Abnormal   Collection Time    10/21/13  8:00 AM      Result Value Range   Sodium 137  137 - 147 mEq/L   Potassium 4.6  3.7 - 5.3 mEq/L   Chloride 98  96 - 112 mEq/L   CO2 22  19 - 32 mEq/L   Glucose, Bld 183 (*) 70 - 99 mg/dL   BUN 94 (*) 6 - 23 mg/dL   Creatinine, Ser 4.60 (*) 0.50 - 1.35 mg/dL   Calcium 9.3  8.4 - 10.5 mg/dL   GFR calc non Af Amer 11 (*) >90 mL/min   GFR calc Af Amer 13 (*) >90 mL/min   Comment: (NOTE)     The eGFR has been calculated using the CKD EPI equation.     This calculation has not been validated in all clinical situations.     eGFR's persistently <90 mL/min signify possible Chronic Kidney     Disease.  CBC WITH DIFFERENTIAL     Status: Abnormal   Collection Time    10/21/13  8:00 AM      Result Value Range   WBC 6.2  4.0 - 10.5 K/uL   RBC 3.20 (*) 4.22 - 5.81 MIL/uL   Hemoglobin 8.9 (*) 13.0 - 17.0 g/dL   HCT  28.2 (*) 39.0 - 52.0 %   MCV 88.1  78.0 - 100.0 fL   MCH 27.8  26.0 - 34.0 pg   MCHC 31.6  30.0 - 36.0 g/dL   RDW 16.2 (*) 11.5 - 15.5 %   Platelets 353  150 - 400 K/uL   Neutrophils Relative % 86 (*) 43 - 77 %   Lymphocytes Relative 6 (*) 12 - 46 %   Monocytes Relative 7  3 - 12 %   Eosinophils Relative 1  0 - 5 %   Basophils Relative 0  0 - 1 %   Neutro Abs 5.3  1.7 - 7.7 K/uL   Lymphs Abs 0.4 (*) 0.7 - 4.0 K/uL   Monocytes Absolute 0.4  0.1 - 1.0 K/uL   Eosinophils Absolute 0.1  0.0 - 0.7 K/uL   Basophils Absolute 0.0  0.0 - 0.1 K/uL   WBC Morphology TOXIC GRANULATION    PROTIME-INR     Status: None   Collection Time    10/21/13  8:00 AM      Result Value Range   Prothrombin Time 14.3  11.6 - 15.2 seconds   INR 1.13  0.00 - 1.49   No results found.  Assessment/Plan Prostate cancer Acute on chronic renal failure Cr 4.6 (1/16 Cr 1.7) Patient with bilateral nephroureteral stents placed 09/25/13 Patient was scheduled for internalization today given worsening renal function, will exchange to bilaterally uncapped PCN's now per Dr. Louis Meckel. PCN sites erythematous, on amoxicillin, afebrile. Patient will contact Dr. Louis Meckel end of this week if persist or worsens after PCN change. Suprapubic pain. Dysuria on amoxicillin since 2/4 Patient and family have spoke with Dr. Louis Meckel and aware of the plan, patient to return 6-8 weeks for routine exchange. Labs reviewed. Risks and Benefits discussed with the patient. All of the patient's questions were answered, patient is agreeable to proceed. Consent signed and in chart.    Tsosie Billing D PA-C 10/21/2013, 9:23 AM

## 2013-10-21 NOTE — Discharge Instructions (Signed)
Percutaneous Nephrostomy Home Guide °A nephrostomy tube allows urine to leave your body when a medical condition prevents it from leaving your kidney normally. Urine is normally carried from the kidneys to the bladder through narrow tubes called ureters. The ureter can become obstructed due to conditions such as kidney stones, tumors, infection, or blood clots. A nephrostomy tube is a hollow, flexible tube placed into the kidney to restore the flow of urine. The tube is placed on the right or left side of your lower back and is connected to an external drainage bag. °PERSONAL HYGIENE °· You may shower unless otherwise told by your health care provider. Prepare for a shower by placing a plastic covering over the nephrostomy tube dressing. °· Change the dressing immediately after showering. Make sure your skin around the nephrostomy tub exit site is dry. °· Avoid immersing your nephrostomy tube in water, such as taking a bath or swimming. °NEPHROSTOMY TUBE CARE °· Your nephrostomy tube is connected to a leg bag or bedside drainage bag. Always keep your tubing, leg bag, or bedside drainage bag below the level of your kidney so that your urine drains freely. °· Your activity is not restricted as long as your activity does not pull or tug on your tube. °· During the day, if you are connecting your nephrostomy tube to a leg bag, ensure that your tubing does not have any kinks and that your urine is draining freely. One helpful technique to prevent kinking or inadvertent dislodging of your nephrostomy tubing is to gently wrap an elsatic bandage over the tubing. This will help to secure the tubing in place. Make sure there is no tension on the tubing so it does not become dislodged. °· At night, you may want to connect your nephrostomy tube to a larger bedside drainage bag. °EMPTYING THE LEG BAG OR BEDSIDE DRAINAGE BAG  °The leg bag or bedside drainage bag should be emptied when it becomes  full and before going to sleep.  Most leg bags and bedside drainage bags have a drain at the bottom that allows urine to be emptied. °1. Hold the leg bag or bedside drainage bag over a toilet or collection container. Use a measuring container if you are directed to measure your urine. °2. Open the drain and allow the urine to drain. °3. Once all the urine is drained from the leg bag or bedside drainage bag, close the drain fully to avoid urine leakage. °4. Flush urine down toilet. If a collection container was used, rinse the container. °NEPHROSTOMY TUBE EXIT SITE CARE °The exit site for your nephrostomy tube is covered with a bandage (dressing). Clean your exit site and change your dressing as directed by your health care provider or if your dressing becomes wet. °Supplies Needed: °· Mild soap and water. °· 4×4 inch (10x10 cm) split gauze pads. °· 4×4 inch (10x10 cm) gauze pads. °· Paper tape. °Exit Site Care and Dressing Change: For the first 2 weeks after having a nephrostomy tube inserted, you should change the dressing every day. After 2 weeks, you can change the dressing 2 times per week, whenever the dressing becomes wet, or as told by your health care provider. Because of the location of your nephrostomy tube, you may need help from another person to complete dressing changes. The steps to changing a dressing are: °1. Wash hands well with soap and water. °2. Gently remove the tapes and dressing from around the nephrostomy tube. Be careful not to pull on the   tube while removing the dressing. Avoid using scissors to remove the dressing since this may lead to accidental damage to the tube. °3. Wash the skin around the tube with the mild soap water, rinse well, and dry with a clean cloth. °4. Inspect the skin around the drain for redness, swelling, and foul-smelling yellow or green discharge. °5. If the drain was sutured to the skin, inspect the suture to verify that it is still anchored in the skin. °6. Place two split gauze pads in and around  the tube exit site. Do not apply ointments or alcohol to the site. °7. Place a gauze pad on top of the split gauze pad. °8. Coil the tube on top of the gauze. The tubing should rest on the gauze, not on the skin. °9. Place tape around each edge of the gauze pad. °10. Secure the nephrostomy tubing. Ensure that the nephrostomy tube does not kink or become pinched. The tubing should rest on the gauze pad and not on the skin. °11. Dispose of used supplies properly. °FLUSHING YOUR NEPHROSTOMY TUBE  °Flush your nephrostomy tube as directed by your health care provider. Flushing of a nephrostomy tube is easier if a three-way stopcock is placed between the tube and the leg bag or bedside drainage bag. One connection of the three-way stopcock connects to your tube, the second connects to the leg bag or bedside drainage bag, and the third connection is usually covered with a cap. The three-way stopcock lever points to the direction on the stopcock that is closed to flow. Normally, the lever points in the direction of the cap to allow urine to drain from the tube to the leg bag or bedside drainage bag. °Supplies Needed: °· Rubbing alcohol wipe. °· 10 mL 0.9% saline syringe. °Flushing Your Nephrostomy Tube °1. Gather needed supplies. °2. Move the lever of the three-way stopcock so that it points towards the leg bag or bedside drainage bag. °3. Clean the cap with a rubbing alcohol wipe and then screw the tip of a 10 mL 0.9% saline syringe onto the cap. °4. Using the syringe plunger, slowly push the 10 mL 0.9% saline in the syringe over 5 10 seconds. If resistance is met or pain occurs while pushing, stop pushing the saline immediately. °5. Remove the syringe from the cap. °6. Return the stopcock lever to the usual position which is pointing in the direction of the cap. °7. Dispose of used supplies properly. °REPLACING YOUR LEG BAG OR BEDSIDE DRAINAGE BAG  °Replace your leg bag or bedside drainage bag, three-way stopcock, and any  extension tubing as directed by your health care provider. Make sure you always have an extra drainage bag and connecting tubing available. °1. Empty urine from your leg bag or bedside drainage bag. °2. Gather new leg bag or bedside drainage bag, three-way stopcock, and any extension tubing. °3. Remove the leg bag or bedside drainage bag, three-way stopcock, and any extension tubing from the nephrostomy tube. °4. Attach the new leg bag or bedside drainage bag, three-way stopcock, and any extension tubing to the nephrostomy tube. °5. Dispose of the used leg bag or bedside drainage bag, three-way stopcock, and any extension tubing from the nephrostomy tube. °SEEK IMMEDIATE MEDICAL CARE IF °· You have a fever or chills. °· You have abdominal pain during the first week after nephrostomy tube insertion. °· You have new appearance of blood in your urine. °· You have back pain, redness, swelling or drainage at the nephrostomy tube insertion   site. °· You have difficulty or pain with flushing the tube. °· You notice a decrease in your urine output not explained by drinking less fluids. °· Your nephrostomy tube comes out or the suture securing the tube comes free. °Document Released: 06/18/2013 Document Reviewed: 04/15/2013 °[employer]® Patient Information ©2014 [employer]. ° °

## 2013-10-21 NOTE — Procedures (Signed)
Bilat percutaneous nephroureteral to nephrostomy exchange under fluoro No complication No blood loss. See complete dictation in [facility].

## 2013-10-21 NOTE — Progress Notes (Signed)
Patient to be discharged by Radiology Dept. Will not return to short stay past procedure.

## 2013-10-28 ENCOUNTER — Inpatient Hospital Stay (HOSPITAL_COMMUNITY): Admission: RE | Admit: 2013-10-28 | Payer: Medicare Other | Source: Ambulatory Visit

## 2013-11-06 ENCOUNTER — Ambulatory Visit: Payer: Medicare Other | Admitting: Radiation Oncology

## 2013-11-13 ENCOUNTER — Ambulatory Visit
Admission: RE | Admit: 2013-11-13 | Discharge: 2013-11-13 | Disposition: A | Discharge status: Home / Self Care | Payer: Medicare Other | Source: Ambulatory Visit | Attending: Radiation Oncology | Admitting: Radiation Oncology

## 2013-11-13 ENCOUNTER — Encounter: Payer: Self-pay | Admitting: Radiation Oncology

## 2013-11-13 VITALS — BP 101/64 | HR 76 | Temp 97.7°F | Resp 20 | Wt 214.0 lb

## 2013-11-13 DIAGNOSIS — C61 Malignant neoplasm of prostate: Secondary | ICD-10-CM

## 2013-11-13 NOTE — Progress Notes (Signed)
Radiation Oncology         [phone] ________________________________  Name: Samuel Garner MRN: [id number]  Date: 11/13/2013  DOB: 11/19/1932  Follow-Up Visit Note  CC: Delphina Cahill, MD  Delphina Cahill, MD  Diagnosis:   80 y.o. 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  His hematuria has nearly resolved and urination has improved.  His performance status remains poor.                              ALLERGIES:  is allergic to lisinopril.  Meds: Current Outpatient Prescriptions  Medication Sig Dispense Refill  . acetaminophen (TYLENOL) 500 MG tablet Take 500-1,000 mg by mouth every 6 (six) hours as needed for moderate pain or headache.       . calcitRIOL (ROCALTROL) 0.25 MCG capsule       . docusate sodium (COLACE) 100 MG capsule Take 100 mg by mouth 2 (two) times daily.      . fenofibrate micronized (ANTARA) 43 MG capsule Take 43 mg by mouth every morning.       . furosemide (LASIX) 40 MG tablet Take 40 mg by mouth every morning.      . insulin glargine (LANTUS) 100 UNIT/ML injection Inject 10 Units into the skin at bedtime.      . metoprolol (LOPRESSOR) 100 MG tablet Take 100 mg by mouth 2 (two) times daily.      . Multiple Vitamins-Minerals (PRESERVISION/LUTEIN) CAPS Take 1 capsule by mouth at bedtime.       . niacin 500 MG tablet Take 500 mg by mouth at bedtime.      . pioglitazone (ACTOS) 30 MG tablet Take 30 mg by mouth every morning.       . pravastatin (PRAVACHOL) 40 MG tablet Take 40 mg by mouth at bedtime.      . traMADol (ULTRAM) 50 MG tablet Take 50 mg by mouth every 6 (six) hours as needed for moderate pain.      . verapamil (CALAN-SR) 120 MG CR tablet Take 1 tablet (120 mg total) by mouth at bedtime.   30 tablet  11   No current facility-administered medications for this encounter.    Physical Findings: The patient is in no acute distress. Patient is alert and oriented.  weight is 214 lb (97.07 kg). His oral temperature is 97.7 F (36.5 C). His blood pressure is 101/64 and his pulse is 76. His respiration is 20. Marland Kitchen  No significant changes.  Lab Findings: Lab Results  Component Value Date   WBC 6.2 10/21/2013   HGB 8.9* 10/21/2013   HCT 28.2* 10/21/2013   MCV 88.1 10/21/2013   PLT 353 10/21/2013    @LASTCHEM @  Radiographic Findings: Ir Nephrostomy Tube Change  10/22/2013   CLINICAL DATA:  Prostate carcinoma with bilateral ureteral obstruction. Patient has bilateral indwelling antegrade nephro ureteral catheters , which were capped as a trial. This resulted in marked elevation of the creatinine and leaking around the catheters.  EXAM: BILATERAL PERCUTANEOUS NEPHROSTOMY CATHETER EXCHANGE UNDER FLUOROSCOPY  TECHNIQUE: The procedure, risks (including but not limited to bleeding, infection, organ damage ), benefits, and alternatives were explained to the patient. Questions regarding the procedure were  encouraged and answered. The patient understands and consents to the procedure. The bilateral antegrade nephro ureteral catheters and surrounding skin were prepped with Betadine, draped in usual sterile fashion.  A small amount of contrast was injected through the right catheter to opacify the renal collecting system. The catheter was cut and exchanged over a 0.035" angiographic wire for a new 10-French pigtail catheter, formed centrally within the collecting system under fluoroscopy. Contrast injection confirms appropriate positioning.  In a similar fashion, the left nephro ureteral catheter was injected, cut, and exchanged for a new 10-French pigtail catheter, formed centrally within the left renal collecting system. Injection confirms appropriate positioning and patency. Both catheters were secured  externally with a Statlock devices and additional 0 Prolene sutures. The patient tolerated the procedure well, with no immediate complication.  FLUOROSCOPY TIME:  36 seconds  IMPRESSION: 1. Technically successful exchange of bilateral nephroureteral catheters for bilateral percutaneous nephrostomy catheters under fluoroscopy   Electronically Signed   By: Arne Cleveland M.D.   On: 10/22/2013 14:25   Ir Nephrostomy Tube Change  10/22/2013   CLINICAL DATA:  Prostate carcinoma with bilateral ureteral obstruction. Patient has bilateral indwelling antegrade nephro ureteral catheters , which were capped as a trial. This resulted in marked elevation of the creatinine and leaking around the catheters.  EXAM: BILATERAL PERCUTANEOUS NEPHROSTOMY CATHETER EXCHANGE UNDER FLUOROSCOPY  TECHNIQUE: The procedure, risks (including but not limited to bleeding, infection, organ damage ), benefits, and alternatives were explained to the patient. Questions regarding the procedure were encouraged and answered. The patient understands and consents to the procedure. The bilateral antegrade nephro ureteral catheters and surrounding skin were prepped with Betadine, draped in usual sterile fashion.  A small amount of contrast was injected through the right catheter to opacify the renal collecting system. The catheter was cut and exchanged over a 0.035" angiographic wire for a new 10-French pigtail catheter, formed centrally within the collecting system under fluoroscopy. Contrast injection confirms appropriate positioning.  In a similar fashion, the left nephro ureteral catheter was injected, cut, and exchanged for a new 10-French pigtail catheter, formed centrally within the left renal collecting system. Injection confirms appropriate positioning and patency. Both catheters were secured externally with a Statlock devices and additional 0 Prolene sutures. The patient tolerated the procedure well, with no immediate complication.  FLUOROSCOPY  TIME:  36 seconds  IMPRESSION: 1. Technically successful exchange of bilateral nephroureteral catheters for bilateral percutaneous nephrostomy catheters under fluoroscopy   Electronically Signed   By: Arne Cleveland M.D.   On: 10/22/2013 14:25    Impression:  The patient is recovering from the effects of radiation.  His hematuria has responed.  Plan:  He'll see Dr. Alen Blew in 2 weeks with CT imaging.  _____________________________________  Sheral Apley. Tammi Klippel, M.D.

## 2013-11-13 NOTE — Progress Notes (Signed)
Pt saw Dr Louis Meckel, urologist earlier today. Pt states this past Sat and again yesterday he had one episode of hematuria with blood clots. He also experienced pain with this event. Pt denies any hematuria today. Pt states his stents are draining less, and he is urinating more, with some frequency. He states his urine is clear yellow today. Pt denies other urinary problems. He is taking stool softeners to prevent constipation. Pt fatigued, appetite comes and goes. Pt states he has had increasing lower back pain "across my lower back" x 2 weeks. He was prescribed Tramadol yesterday by his PCP. He states he took it last night w/good relief of back pain. He states he has had difficulty sleeping in recent weeks, but "slept all night last night".

## 2013-11-24 ENCOUNTER — Other Ambulatory Visit: Payer: Self-pay | Admitting: Radiation Oncology

## 2013-11-24 ENCOUNTER — Telehealth: Payer: Self-pay | Admitting: Radiation Oncology

## 2013-11-24 DIAGNOSIS — C61 Malignant neoplasm of prostate: Secondary | ICD-10-CM

## 2013-11-24 DIAGNOSIS — N189 Chronic kidney disease, unspecified: Secondary | ICD-10-CM

## 2013-11-24 MED ORDER — PROCHLORPERAZINE MALEATE 10 MG PO TABS: 10.0000 mg | ORAL_TABLET | Freq: Four times a day (QID) | ORAL | Status: AC | PRN

## 2013-11-24 NOTE — Telephone Encounter (Signed)
Phoned patient's wife. Explained we are diligently working to find a facility to complete MRI of brain this week. Explained we will resume search tomorrow and call her back in the morning with direction. Again, advised if her husband's condition worsen to call the ambulance or take him for evaluation in the emergency room.

## 2013-11-24 NOTE — Telephone Encounter (Signed)
Returned message left by patient's wife. She explains her husband vomits "after everything he eats and when he takes his medications." She states, "what happens is he coughs until he throws up." She reports he has tried mucinex for 72 hours without relief. She explains he is complaining of increased low back pain and even fell this morning "because his knees gave way." Reports denies any drainage is coming out of her husband's stents. She reports occasional when he void he passes small blood clots. She reports that he denies dysuria or headaches. She reports he has been in the bed all day and isn't eating. She reports his blood sugar today was 156 and denies that he has a fever. She reports he finished up a seven day course of antibiotics this morning. Wife denies the patient complaints of headaches. Wife is requesting an order for a hospital bed. Inquired about hospice but, patient reports, "we haven't contacted them ..i spoke with the children about it this weekend and they say we aren't there yet." Patient scheduled for a follow up with [facility] on 3/31 after 3/24 CT of body. Informed Dr. Tammi Klippel of these findings. He escribed compazine. Advised patient's wife to pick up compazine and give it to her husband every six hours as directed on the bottle. Explained if the nausea could be controlled he would begin to eat and hydrate again thus, have more energy. Ordered MRI of brain without contrast per Dr. Johny Shears order. Explained to the patient's wife Samuel Garner will contact them with scan date and time. Advised patient's wife of 01/01/2014 1100 follow up with Dr. Tammi Klippel to review scan followed by meeting with Wadie Lessen, NP of palliative care. Advise wife if husband's condition got worse to take him to the emergency room or call ambulance. She verbalized understanding.

## 2013-11-25 ENCOUNTER — Telehealth: Payer: Self-pay | Admitting: *Deleted

## 2013-11-25 NOTE — Telephone Encounter (Signed)
Called patient to inform of test for 11-25-13- arrival time - 2:30 pm for his MRI @ Dr. Melven Sartorius Office and his fu visit on 12-01-13 @ 11:00 am with Dr. Johny Shears Office, spoke with patient's wife and they are aware of these appts.

## 2013-11-26 ENCOUNTER — Telehealth: Payer: Self-pay | Admitting: Radiation Oncology

## 2013-11-26 ENCOUNTER — Encounter: Payer: Self-pay | Admitting: Radiation Oncology

## 2013-11-26 NOTE — Telephone Encounter (Signed)
Good ideas.  I will decide when I see him.  MM

## 2013-11-26 NOTE — Telephone Encounter (Signed)
Phoned patient's wife. Explained the MRI yesterday did not show brain mets. She expressed relief. Encouraged they keep Monday's follow up. She reports that they plan to. She explains he is no better. Explains he continues to be weak and isn't eating. However, she does report that the compazine otc has resolved his nausea. She explains he doesn't want to eat anything but, a few bites of pureed peaches saying everything else taste bad. She reports they have tried ensure, boost, carnation instant breakfast, and milkshakes without success.

## 2013-11-26 NOTE — Progress Notes (Signed)
Placed copy of MRI on Dr. Johny Shears desk. Informed him of results via phone.

## 2013-11-28 ENCOUNTER — Inpatient Hospital Stay (HOSPITAL_COMMUNITY)
Admission: EM | Admit: 2013-11-28 | Discharge: 2013-12-01 | DRG: 682 | Disposition: A | Discharge status: Hospice | Payer: Medicare Other | Attending: Internal Medicine | Admitting: Internal Medicine

## 2013-11-28 ENCOUNTER — Other Ambulatory Visit: Payer: Self-pay

## 2013-11-28 ENCOUNTER — Encounter (HOSPITAL_COMMUNITY): Payer: Self-pay | Admitting: Emergency Medicine

## 2013-11-28 ENCOUNTER — Telehealth: Payer: Self-pay | Admitting: Radiology

## 2013-11-28 ENCOUNTER — Telehealth: Payer: Self-pay | Admitting: Radiation Oncology

## 2013-11-28 ENCOUNTER — Emergency Department (HOSPITAL_COMMUNITY): Payer: Medicare Other

## 2013-11-28 ENCOUNTER — Inpatient Hospital Stay (HOSPITAL_COMMUNITY): Payer: Medicare Other

## 2013-11-28 DIAGNOSIS — N3289 Other specified disorders of bladder: Secondary | ICD-10-CM

## 2013-11-28 DIAGNOSIS — R4182 Altered mental status, unspecified: Secondary | ICD-10-CM

## 2013-11-28 DIAGNOSIS — E785 Hyperlipidemia, unspecified: Secondary | ICD-10-CM | POA: Diagnosis present

## 2013-11-28 DIAGNOSIS — I10 Essential (primary) hypertension: Secondary | ICD-10-CM

## 2013-11-28 DIAGNOSIS — R5381 Other malaise: Secondary | ICD-10-CM

## 2013-11-28 DIAGNOSIS — B370 Candidal stomatitis: Secondary | ICD-10-CM | POA: Diagnosis present

## 2013-11-28 DIAGNOSIS — Z79899 Other long term (current) drug therapy: Secondary | ICD-10-CM

## 2013-11-28 DIAGNOSIS — Z923 Personal history of irradiation: Secondary | ICD-10-CM

## 2013-11-28 DIAGNOSIS — N390 Urinary tract infection, site not specified: Secondary | ICD-10-CM | POA: Diagnosis present

## 2013-11-28 DIAGNOSIS — Z794 Long term (current) use of insulin: Secondary | ICD-10-CM

## 2013-11-28 DIAGNOSIS — R339 Retention of urine, unspecified: Secondary | ICD-10-CM | POA: Diagnosis present

## 2013-11-28 DIAGNOSIS — R0602 Shortness of breath: Secondary | ICD-10-CM | POA: Diagnosis not present

## 2013-11-28 DIAGNOSIS — E43 Unspecified severe protein-calorie malnutrition: Secondary | ICD-10-CM | POA: Diagnosis present

## 2013-11-28 DIAGNOSIS — Z8249 Family history of ischemic heart disease and other diseases of the circulatory system: Secondary | ICD-10-CM

## 2013-11-28 DIAGNOSIS — Z6826 Body mass index (BMI) 26.0-26.9, adult: Secondary | ICD-10-CM

## 2013-11-28 DIAGNOSIS — R338 Other retention of urine: Secondary | ICD-10-CM

## 2013-11-28 DIAGNOSIS — Z515 Encounter for palliative care: Secondary | ICD-10-CM

## 2013-11-28 DIAGNOSIS — Z436 Encounter for attention to other artificial openings of urinary tract: Secondary | ICD-10-CM

## 2013-11-28 DIAGNOSIS — R52 Pain, unspecified: Secondary | ICD-10-CM

## 2013-11-28 DIAGNOSIS — R5383 Other fatigue: Secondary | ICD-10-CM

## 2013-11-28 DIAGNOSIS — E860 Dehydration: Secondary | ICD-10-CM | POA: Diagnosis present

## 2013-11-28 DIAGNOSIS — M109 Gout, unspecified: Secondary | ICD-10-CM | POA: Diagnosis present

## 2013-11-28 DIAGNOSIS — N2581 Secondary hyperparathyroidism of renal origin: Secondary | ICD-10-CM | POA: Diagnosis present

## 2013-11-28 DIAGNOSIS — C61 Malignant neoplasm of prostate: Secondary | ICD-10-CM | POA: Diagnosis present

## 2013-11-28 DIAGNOSIS — L97509 Non-pressure chronic ulcer of other part of unspecified foot with unspecified severity: Secondary | ICD-10-CM | POA: Diagnosis present

## 2013-11-28 DIAGNOSIS — N189 Chronic kidney disease, unspecified: Secondary | ICD-10-CM | POA: Diagnosis present

## 2013-11-28 DIAGNOSIS — E1169 Type 2 diabetes mellitus with other specified complication: Secondary | ICD-10-CM | POA: Diagnosis present

## 2013-11-28 DIAGNOSIS — N1330 Unspecified hydronephrosis: Secondary | ICD-10-CM | POA: Diagnosis present

## 2013-11-28 DIAGNOSIS — I252 Old myocardial infarction: Secondary | ICD-10-CM

## 2013-11-28 DIAGNOSIS — R531 Weakness: Secondary | ICD-10-CM

## 2013-11-28 DIAGNOSIS — N179 Acute kidney failure, unspecified: Principal | ICD-10-CM | POA: Diagnosis present

## 2013-11-28 DIAGNOSIS — I129 Hypertensive chronic kidney disease with stage 1 through stage 4 chronic kidney disease, or unspecified chronic kidney disease: Secondary | ICD-10-CM | POA: Diagnosis present

## 2013-11-28 DIAGNOSIS — R110 Nausea: Secondary | ICD-10-CM

## 2013-11-28 DIAGNOSIS — N139 Obstructive and reflux uropathy, unspecified: Secondary | ICD-10-CM

## 2013-11-28 DIAGNOSIS — Z66 Do not resuscitate: Secondary | ICD-10-CM | POA: Diagnosis present

## 2013-11-28 DIAGNOSIS — Z833 Family history of diabetes mellitus: Secondary | ICD-10-CM

## 2013-11-28 DIAGNOSIS — D649 Anemia, unspecified: Secondary | ICD-10-CM

## 2013-11-28 DIAGNOSIS — I2510 Atherosclerotic heart disease of native coronary artery without angina pectoris: Secondary | ICD-10-CM | POA: Diagnosis present

## 2013-11-28 DIAGNOSIS — Z87891 Personal history of nicotine dependence: Secondary | ICD-10-CM

## 2013-11-28 DIAGNOSIS — E119 Type 2 diabetes mellitus without complications: Secondary | ICD-10-CM | POA: Diagnosis present

## 2013-11-28 LAB — URINALYSIS, ROUTINE W REFLEX MICROSCOPIC
Bilirubin Urine: NEGATIVE
Glucose, UA: NEGATIVE mg/dL
Ketones, ur: NEGATIVE mg/dL
Nitrite: NEGATIVE
Protein, ur: 30 mg/dL — AB
Specific Gravity, Urine: 1.012 (ref 1.005–1.030)
Urobilinogen, UA: 0.2 mg/dL (ref 0.0–1.0)
pH: 5.5 (ref 5.0–8.0)

## 2013-11-28 LAB — POCT I-STAT, CHEM 8
BUN: 119 mg/dL — AB (ref 6–23)
CHLORIDE: 107 meq/L (ref 96–112)
Calcium, Ion: 1.37 mmol/L — ABNORMAL HIGH (ref 1.13–1.30)
Creatinine, Ser: 4.3 mg/dL — ABNORMAL HIGH (ref 0.50–1.35)
Glucose, Bld: 140 mg/dL — ABNORMAL HIGH (ref 70–99)
HCT: 32 % — ABNORMAL LOW (ref 39.0–52.0)
Hemoglobin: 10.9 g/dL — ABNORMAL LOW (ref 13.0–17.0)
POTASSIUM: 5.5 meq/L — AB (ref 3.7–5.3)
SODIUM: 142 meq/L (ref 137–147)
TCO2: 27 mmol/L (ref 0–100)

## 2013-11-28 LAB — GLUCOSE, CAPILLARY
Glucose-Capillary: 130 mg/dL — ABNORMAL HIGH (ref 70–99)
Glucose-Capillary: 106 mg/dL — ABNORMAL HIGH (ref 70–99)
Glucose-Capillary: 122 mg/dL — ABNORMAL HIGH (ref 70–99)
Glucose-Capillary: 87 mg/dL (ref 70–99)

## 2013-11-28 LAB — CBC WITH DIFFERENTIAL/PLATELET
Basophils Absolute: 0 10*3/uL (ref 0.0–0.1)
Basophils Relative: 0 % (ref 0–1)
EOS PCT: 1 % (ref 0–5)
Eosinophils Absolute: 0.1 10*3/uL (ref 0.0–0.7)
HCT: 33.7 % — ABNORMAL LOW (ref 39.0–52.0)
Hemoglobin: 10.5 g/dL — ABNORMAL LOW (ref 13.0–17.0)
LYMPHS ABS: 0.7 10*3/uL (ref 0.7–4.0)
LYMPHS PCT: 9 % — AB (ref 12–46)
MCH: 28.2 pg (ref 26.0–34.0)
MCHC: 31.2 g/dL (ref 30.0–36.0)
MCV: 90.3 fL (ref 78.0–100.0)
Monocytes Absolute: 0.6 10*3/uL (ref 0.1–1.0)
Monocytes Relative: 8 % (ref 3–12)
Neutro Abs: 6.6 10*3/uL (ref 1.7–7.7)
Neutrophils Relative %: 83 % — ABNORMAL HIGH (ref 43–77)
PLATELETS: 297 10*3/uL (ref 150–400)
RBC: 3.73 MIL/uL — ABNORMAL LOW (ref 4.22–5.81)
RDW: 16.9 % — ABNORMAL HIGH (ref 11.5–15.5)
WBC: 8 10*3/uL (ref 4.0–10.5)

## 2013-11-28 LAB — COMPREHENSIVE METABOLIC PANEL
ALK PHOS: 158 U/L — AB (ref 39–117)
ALT: 19 U/L (ref 0–53)
AST: 45 U/L — AB (ref 0–37)
Albumin: 2.8 g/dL — ABNORMAL LOW (ref 3.5–5.2)
BILIRUBIN TOTAL: 0.8 mg/dL (ref 0.3–1.2)
BUN: 104 mg/dL — ABNORMAL HIGH (ref 6–23)
CHLORIDE: 98 meq/L (ref 96–112)
CO2: 22 meq/L (ref 19–32)
Calcium: 11.6 mg/dL — ABNORMAL HIGH (ref 8.4–10.5)
Creatinine, Ser: 3.9 mg/dL — ABNORMAL HIGH (ref 0.50–1.35)
GFR calc Af Amer: 16 mL/min — ABNORMAL LOW (ref >90)
GFR, EST NON AFRICAN AMERICAN: 13 mL/min — AB (ref >90)
Glucose, Bld: 146 mg/dL — ABNORMAL HIGH (ref 70–99)
POTASSIUM: 4.7 meq/L (ref 3.7–5.3)
SODIUM: 142 meq/L (ref 137–147)
Total Protein: 7 g/dL (ref 6.0–8.3)

## 2013-11-28 LAB — URINE MICROSCOPIC-ADD ON

## 2013-11-28 MED ORDER — CALCITRIOL 0.25 MCG PO CAPS
0.2500 ug | ORAL_CAPSULE | Freq: Every day | ORAL | Status: DC
  Administered 2013-11-28 – 2013-11-29 (×2): 0.25 ug via ORAL
  Filled 2013-11-28 (×3): qty 1

## 2013-11-28 MED ORDER — HEPARIN SODIUM (PORCINE) 5000 UNIT/ML IJ SOLN
5000.0000 [IU] | Freq: Three times a day (TID) | INTRAMUSCULAR | Status: DC
  Administered 2013-11-28 – 2013-12-01 (×10): 5000 [IU] via SUBCUTANEOUS
  Filled 2013-11-28 (×13): qty 1

## 2013-11-28 MED ORDER — TRAMADOL HCL 50 MG PO TABS
50.0000 mg | ORAL_TABLET | Freq: Four times a day (QID) | ORAL | Status: DC | PRN
  Administered 2013-11-28: 50 mg via ORAL
  Filled 2013-11-28: qty 1

## 2013-11-28 MED ORDER — SODIUM CHLORIDE 0.9 % IV SOLN
Freq: Once | INTRAVENOUS | Status: AC
  Administered 2013-11-28: 01:00:00 via INTRAVENOUS

## 2013-11-28 MED ORDER — ACETAMINOPHEN 500 MG PO TABS
500.0000 mg | ORAL_TABLET | Freq: Four times a day (QID) | ORAL | Status: DC | PRN
  Administered 2013-11-29: 500 mg via ORAL
  Administered 2013-11-30: 1000 mg via ORAL
  Filled 2013-11-28 (×2): qty 1
  Filled 2013-11-28: qty 2

## 2013-11-28 MED ORDER — DEXTROSE 5 % IV SOLN
1.0000 g | INTRAVENOUS | Status: DC
  Administered 2013-11-29 – 2013-12-01 (×3): 1 g via INTRAVENOUS
  Filled 2013-11-28 (×3): qty 10

## 2013-11-28 MED ORDER — ENSURE PUDDING PO PUDG
1.0000 | Freq: Three times a day (TID) | ORAL | Status: DC
  Administered 2013-11-28 – 2013-11-30 (×6): 1 via ORAL
  Filled 2013-11-28 (×11): qty 1

## 2013-11-28 MED ORDER — DEXTROSE 5 % IV SOLN
1.0000 g | Freq: Once | INTRAVENOUS | Status: AC
  Administered 2013-11-28: 1 g via INTRAVENOUS
  Filled 2013-11-28: qty 10

## 2013-11-28 MED ORDER — IOHEXOL 300 MG/ML  SOLN: 25.0000 mL | Freq: Once | INTRAMUSCULAR | Status: AC | PRN

## 2013-11-28 MED ORDER — NALOXONE HCL 0.4 MG/ML IJ SOLN
0.4000 mg | Freq: Once | INTRAMUSCULAR | Status: AC
  Administered 2013-11-28: 0.4 mg via INTRAVENOUS

## 2013-11-28 MED ORDER — ONDANSETRON HCL 4 MG/2ML IJ SOLN
4.0000 mg | Freq: Once | INTRAMUSCULAR | Status: AC
  Administered 2013-11-28: 4 mg via INTRAVENOUS
  Filled 2013-11-28: qty 2

## 2013-11-28 MED ORDER — SIMVASTATIN 20 MG PO TABS
20.0000 mg | ORAL_TABLET | Freq: Every day | ORAL | Status: DC
Start: 2013-11-28 — End: 2013-11-28
  Filled 2013-11-28: qty 1

## 2013-11-28 MED ORDER — DOCUSATE SODIUM 100 MG PO CAPS
100.0000 mg | ORAL_CAPSULE | Freq: Two times a day (BID) | ORAL | Status: DC | PRN
  Administered 2013-11-30: 100 mg via ORAL
  Filled 2013-11-28 (×2): qty 1

## 2013-11-28 MED ORDER — VITAMIN D3 25 MCG (1000 UNIT) PO TABS
1000.0000 [IU] | ORAL_TABLET | Freq: Every day | ORAL | Status: DC
  Administered 2013-11-28 – 2013-11-29 (×2): 1000 [IU] via ORAL
  Filled 2013-11-28 (×3): qty 1

## 2013-11-28 MED ORDER — SODIUM CHLORIDE 0.9 % IV SOLN
INTRAVENOUS | Status: DC
  Administered 2013-11-28 – 2013-12-01 (×6): via INTRAVENOUS

## 2013-11-28 MED ORDER — PRAVASTATIN SODIUM 40 MG PO TABS
40.0000 mg | ORAL_TABLET | Freq: Every day | ORAL | Status: DC
  Administered 2013-11-28 – 2013-11-29 (×2): 40 mg via ORAL
  Filled 2013-11-28 (×3): qty 1

## 2013-11-28 MED ORDER — OXYCODONE HCL 5 MG PO TABS: 2.5000 mg | ORAL_TABLET | Freq: Four times a day (QID) | ORAL | Status: DC | PRN

## 2013-11-28 MED ORDER — INSULIN ASPART 100 UNIT/ML ~~LOC~~ SOLN
0.0000 [IU] | Freq: Three times a day (TID) | SUBCUTANEOUS | Status: DC
  Administered 2013-11-28 – 2013-11-29 (×5): 1 [IU] via SUBCUTANEOUS
  Administered 2013-11-30: 2 [IU] via SUBCUTANEOUS
  Administered 2013-11-30: 1 [IU] via SUBCUTANEOUS
  Administered 2013-12-01: 2 [IU] via SUBCUTANEOUS

## 2013-11-28 MED ORDER — ONDANSETRON HCL 4 MG/2ML IJ SOLN
4.0000 mg | Freq: Four times a day (QID) | INTRAMUSCULAR | Status: DC | PRN
  Administered 2013-11-28 – 2013-12-01 (×7): 4 mg via INTRAVENOUS
  Filled 2013-11-28 (×6): qty 2

## 2013-11-28 MED ORDER — METOPROLOL TARTRATE 100 MG PO TABS
100.0000 mg | ORAL_TABLET | Freq: Two times a day (BID) | ORAL | Status: DC
  Administered 2013-11-28 – 2013-12-01 (×7): 100 mg via ORAL
  Filled 2013-11-28 (×8): qty 1

## 2013-11-28 MED ORDER — PROCHLORPERAZINE MALEATE 10 MG PO TABS
10.0000 mg | ORAL_TABLET | Freq: Four times a day (QID) | ORAL | Status: DC | PRN
  Filled 2013-11-28: qty 1

## 2013-11-28 MED ORDER — INSULIN ASPART 100 UNIT/ML ~~LOC~~ SOLN: 0.0000 [IU] | SUBCUTANEOUS | Status: DC

## 2013-11-28 MED ORDER — VERAPAMIL HCL ER 120 MG PO TBCR
120.0000 mg | EXTENDED_RELEASE_TABLET | Freq: Every day | ORAL | Status: DC
  Administered 2013-11-28 – 2013-11-29 (×2): 120 mg via ORAL
  Filled 2013-11-28 (×4): qty 1

## 2013-11-28 NOTE — Progress Notes (Signed)
INITIAL NUTRITION ASSESSMENT  Pt meets criteria for SEVERE malnutrition in the context of acute illness or injury as evidenced by weight loss of 8.5% in 3 months, energy intake </= 50% for >/= 5 days, and moderate muscle mass depletion.   DOCUMENTATION CODES Per approved criteria  -Severe malnutrition in the context of acute illness or injury -Obesity Unspecified   INTERVENTION: Provide Ensure pudding TID between meals, each container provides 170 kcals and 4 grams of protein.  Will continue to monitor.  NUTRITION DIAGNOSIS: Inadequate oral intake related to decreased appetite as evidenced by meal completion 5-10%.   Goal: Pt to meet >/= 90% of their estimated nutrition needs.  Monitor:  PO intake, weight trends, labs, I/O's  Reason for Assessment: MST  80 y.o. male  Admitting Dx: Altered mental status  ASSESSMENT: 80 y.o. male with PMH of diabetes, HTN, ASCVD, CKD who presents to the ED with 4 day history of worsening mental status. Per family he has been lethargic and sleeping all the time, very poor PO intake, drinking essentially no water at all. He has been unable to eat, drink, or get out of bed for the past 4 days until today. He was recently diagnosed with prostate cancer which has caused urinary retention and bilateral hydronephrosis and kidney failure in January. After failing bilateral urinary stents in January, he ultimately had B nephrostomy tubes placed.  Wife was at pt bedside. Wife reports pt has no appetite and has had nothing to eat or drink in the past 4 days. Wife reports pt would not have an appetite and would sleep all day. Wife has been trying to make pt eat or drink, but no PO intake.Wife reports pts altered mental status is likely due to his medication, she reports MD are taking him off it. Pt's usual body weight is reported to be 240 lbs. Pt currently still has no appetite with meal completion 5-10%. Pt refuses Glucerna as it upsets his stomach. Pt reports  liking pudding, will order Ensure pudding. Pt denies are stomach pains, nausea, or difficulties chewing or swallowing.  Nutrition Focused Physical Exam:  Subcutaneous Fat:  Orbital Region: N/A Upper Arm Region: WNL Thoracic and Lumbar Region: WNL  Muscle:  Temple Region: WNL Clavicle Bone Region: Moderate depletion Clavicle and Acromion Bone Region: Moderate depletion Scapular Bone Region: WNL Dorsal Hand: Moderate depletion Patellar Region: Moderate depletion Anterior Thigh Region: Moderate depletion Posterior Calf Region: Moderate depletion  Edema: none  Labs: Low albumin and GFR High glucose, BUN, creatinine, calcium, AST, alkaline phosphatase  Height: Ht Readings from Last 1 Encounters:  10/16/13 5\' 9"  (1.753 m)    Weight: Wt Readings from Last 1 Encounters:  11/13/13 214 lb (97.07 kg)  No current weight. Per RN, will weigh pt today.  Ideal Body Weight: 160 lb  % Ideal Body Weight: 134%  Wt Readings from Last 10 Encounters:  11/13/13 214 lb (97.07 kg)  10/16/13 212 lb 8 oz (96.389 kg)  10/03/13 225 lb (102.059 kg)  09/23/13 234 lb 12.6 oz (106.5 kg)  08/29/13 236 lb 15.9 oz (107.5 kg)  08/29/13 236 lb 15.9 oz (107.5 kg)  08/28/13 230 lb (104.327 kg)  01/28/13 231 lb (104.781 kg)  06/04/12 220 lb (99.791 kg)  01/29/12 221 lb (100.245 kg)    Usual Body Weight: 240 lb  % Usual Body Weight: 89%  BMI:  There is no weight on file to calculate BMI.  Estimated Nutritional Needs: Kcal: 2200-2400 Protein: 80-95 grams Fluid: 2.2 -2.4 L/day  Skin: no issues noted  Diet Order: Carb Control  EDUCATION NEEDS: -No education needs identified at this time  No intake or output data in the 24 hours ending 11/28/13 1127  Last BM: PTA   Labs:   Recent Labs Lab 11/28/13 0100 11/28/13 0115  NA 142 142  K 4.7 5.5*  CL 98 107  CO2 22  --   BUN 104* 119*  CREATININE 3.90* 4.30*  CALCIUM 11.6*  --   GLUCOSE 146* 140*    CBG (last 3)   Recent Labs   11/28/13 0731  GLUCAP 130*    Scheduled Meds: . calcitRIOL  0.25 mcg Oral Daily  . [START ON 11/29/2013] cefTRIAXone (ROCEPHIN)  IV  1 g Intravenous Q24H  . cholecalciferol  1,000 Units Oral Daily  . heparin  5,000 Units Subcutaneous 3 times per day  . insulin aspart  0-9 Units Subcutaneous TID WC  . metoprolol  100 mg Oral BID  . pravastatin  40 mg Oral QHS  . verapamil  120 mg Oral QHS    Continuous Infusions: . sodium chloride 125 mL/hr at 11/28/13 1025    Past Medical History  Diagnosis Date  . Diabetes mellitus, type 2     with microalbuminuria  . Hypertension   . Arteriosclerotic cardiovascular disease (ASCVD)      2 vessel percutaneous transluminal coronary angioplasty in 3/93  . Hyperlipidemia     low HDL, high triglycerides  . Tobacco abuse, in remission     discontinued in 1993  . Gout   . Chronic kidney disease     creatinine 1.8 in 2002, 1.6 1n 2007 and 1.0 and 2008; deterioration in renal function; Secondary hyperparathyroidism  . Type IV renal tubular acidosis     With hyperkalemia  . Myocardial infarction     1993  . Small cell carcinoma of prostate     Past Surgical History  Procedure Laterality Date  . Hernia repair    . Coronary angioplasty  1993  . Knee arthroscopy    . Transurethral resection of prostate N/A 08/29/2013    Procedure: TRANSURETHRAL RESECTION OF THE PROSTATE (TURP) and Clot evacuation;  Surgeon: Ardis Hughs, MD;  Location: [facility];  Service: Urology;  Laterality: N/A;   Kallie Locks Dietetic Intern Pager: [phone]

## 2013-11-28 NOTE — Progress Notes (Addendum)
Additional Interventions: -Recommend outpatient referral to oncology RD  -Monitor renal profile and modify estimated protein needs as warranted -Continue with liberalized diet to encourage PO intake  I have reviewed and agree with nutrition assessment completed by Kallie Locks, [place] MS RD LDN Clinical Dietitian [id number]

## 2013-11-28 NOTE — Telephone Encounter (Signed)
Phoned patient's wife in hospital room to inquire about meeting with palliative care. She reports the meeting was a good one and that Wadie Lessen, NP set up a time to meet with all the family on Sunday morning. Wife reports that in the mean time Stanton Kidney has asked they read a book she provided. Wife feels comfortable about cancelling follow up with Dr. Tammi Klippel for Monday. Wife reports all needs have been addressed. Wife reports appreciation for the call.

## 2013-11-28 NOTE — Progress Notes (Signed)
Patient ID: Samuel Garner, male   DOB: 1933-08-17, 80 y.o.   MRN: [id number] Pt will be scheduled for bilateral percutaneous nephrostomy tube exchanges today. Plan d/w pt /wife with their understanding and consent.

## 2013-11-28 NOTE — ED Provider Notes (Signed)
CSN: [id number]     Arrival date & time 11/28/13  0051 History   First MD Initiated Contact with Patient 11/28/13 0055     Chief Complaint  Patient presents with  . Shortness of Breath  . Fatigue  . Weakness     (Consider location/radiation/quality/duration/timing/severity/associated sxs/prior Treatment) HPI Comments: 80 year old male with a history of altered mental status. The patient's family states that over the last 4 days he has had a progressive decline in his mental status. They state that this correlates with the use of oxycodone which was recently prescribed. He has also recently been placed on an antibiotic because of the urinary infection. The altered mental status is gradually worsening, persistent, nothing seems to make it better or worse. He has not had associated fevers or chills but has had some shortness of breath this evening that caused the family members to call 911. According to the medical record the patient was recently diagnosed with a poorly differentiated carcinoma of the prostate which had caused urinary retention and bilateral hydronephrosis, acute on chronic renal failure requiring hospitalization and bilateral nephrostomy tube placement. He has since been improving in the outpatient setting. The family member states that today he was able to get him up out of bed into the table but for a few minutes before he had to go back to bed. Essentially he has been unable to eat drink or get out of bed for the last 5 days.  Patient is a 80 y.o. male presenting with shortness of breath and weakness. The history is provided by a relative, medical records and the EMS personnel.  Shortness of Breath Weakness Associated symptoms include shortness of breath.    Past Medical History  Diagnosis Date  . Diabetes mellitus, type 2     with microalbuminuria  . Hypertension   . Arteriosclerotic cardiovascular disease (ASCVD)      2 vessel percutaneous transluminal coronary  angioplasty in 3/93  . Hyperlipidemia     low HDL, high triglycerides  . Tobacco abuse, in remission     discontinued in 1993  . Gout   . Chronic kidney disease     creatinine 1.8 in 2002, 1.6 1n 2007 and 1.0 and 2008; deterioration in renal function; Secondary hyperparathyroidism  . Type IV renal tubular acidosis     With hyperkalemia  . Myocardial infarction     1993  . Small cell carcinoma of prostate    Past Surgical History  Procedure Laterality Date  . Hernia repair    . Coronary angioplasty  1993  . Knee arthroscopy    . Transurethral resection of prostate N/A 08/29/2013    Procedure: TRANSURETHRAL RESECTION OF THE PROSTATE (TURP) and Clot evacuation;  Surgeon: Ardis Hughs, MD;  Location: [facility];  Service: Urology;  Laterality: N/A;   Family History  Problem Relation Age of Onset  . Diabetes Mother   . Heart attack Father    History  Substance Use Topics  . Smoking status: Former Smoker -- 1.00 packs/day for 40 years    Quit date: 09/12/1991  . Smokeless tobacco: Never Used  . Alcohol Use: No    Review of Systems  Unable to perform ROS: Mental status change  Respiratory: Positive for shortness of breath.   Neurological: Positive for weakness.      Allergies  Lisinopril  Home Medications   Current Outpatient Rx  Name  Route  Sig  Dispense  Refill  . acetaminophen (TYLENOL) 500 MG tablet  Oral   Take 500-1,000 mg by mouth every 6 (six) hours as needed for moderate pain or headache.          . calcitRIOL (ROCALTROL) 0.25 MCG capsule   Oral   Take 0.25 mcg by mouth daily.          . cholecalciferol (VITAMIN D) 1000 UNITS tablet   Oral   Take 1,000 Units by mouth daily.         . fenofibrate micronized (ANTARA) 43 MG capsule   Oral   Take 43 mg by mouth every morning.          . furosemide (LASIX) 40 MG tablet   Oral   Take 40 mg by mouth every morning.         . insulin glargine (LANTUS) 100 UNIT/ML injection   Subcutaneous    Inject 10 Units into the skin at bedtime.         . metoprolol (LOPRESSOR) 100 MG tablet   Oral   Take 100 mg by mouth 2 (two) times daily.         . Multiple Vitamins-Minerals (PRESERVISION/LUTEIN) CAPS   Oral   Take 1 capsule by mouth at bedtime.          . niacin 500 MG tablet   Oral   Take 500 mg by mouth at bedtime.         Marland Kitchen oxyCODONE (OXY IR/ROXICODONE) 5 MG immediate release tablet   Oral   Take 5 mg by mouth every 6 (six) hours as needed for severe pain.         . pioglitazone (ACTOS) 30 MG tablet   Oral   Take 30 mg by mouth every morning.          . pravastatin (PRAVACHOL) 40 MG tablet   Oral   Take 40 mg by mouth at bedtime.         . prochlorperazine (COMPAZINE) 10 MG tablet   Oral   Take 1 tablet (10 mg total) by mouth every 6 (six) hours as needed for nausea or vomiting.   30 tablet   0   . traMADol (ULTRAM) 50 MG tablet   Oral   Take 50 mg by mouth every 6 (six) hours as needed for moderate pain.         . verapamil (CALAN-SR) 120 MG CR tablet   Oral   Take 1 tablet (120 mg total) by mouth at bedtime.   30 tablet   11   . docusate sodium (COLACE) 100 MG capsule   Oral   Take 100 mg by mouth 2 (two) times daily as needed for mild constipation.           BP 99/63  Pulse 68  Temp(Src) 97.7 F (36.5 C) (Oral)  Resp 19  SpO2 95% Physical Exam  Nursing note and vitals reviewed. Constitutional: He appears well-developed and well-nourished.  Somnolent  HENT:  Head: Normocephalic and atraumatic.  Mouth/Throat: No oropharyngeal exudate.  Mucous membranes are dehydrated  Eyes: Conjunctivae and EOM are normal. Pupils are equal, round, and reactive to light. Right eye exhibits no discharge. Left eye exhibits no discharge. No scleral icterus.  Neck: Normal range of motion. Neck supple. No JVD present. No thyromegaly present.  Cardiovascular: Normal rate, regular rhythm, normal heart sounds and intact distal pulses.  Exam reveals no  gallop and no friction rub.   No murmur heard. Pulmonary/Chest: Effort normal and breath sounds normal. No respiratory distress.  He has no wheezes. He has no rales.  Abdominal: Soft. Bowel sounds are normal. He exhibits no distension and no mass. There is no tenderness.  Musculoskeletal: Normal range of motion. He exhibits no edema and no tenderness.  Lymphadenopathy:    He has no cervical adenopathy.  Neurological:  Somnolent, arousable to voice, able to assist in setting up in the bed but extremely generally weak. The patient is unable to say more than one or 2 words  Skin: Skin is warm and dry. No rash noted. No erythema.  Psychiatric: He has a normal mood and affect. His behavior is normal.    ED Course  Procedures (including critical care time) Labs Review Labs Reviewed  CBC WITH DIFFERENTIAL - Abnormal; Notable for the following:    RBC 3.73 (*)    Hemoglobin 10.5 (*)    HCT 33.7 (*)    RDW 16.9 (*)    Neutrophils Relative % 83 (*)    Lymphocytes Relative 9 (*)    All other components within normal limits  URINALYSIS, ROUTINE W REFLEX MICROSCOPIC - Abnormal; Notable for the following:    APPearance TURBID (*)    Hgb urine dipstick LARGE (*)    Protein, ur 30 (*)    Leukocytes, UA LARGE (*)    All other components within normal limits  COMPREHENSIVE METABOLIC PANEL - Abnormal; Notable for the following:    Glucose, Bld 146 (*)    BUN 104 (*)    Creatinine, Ser 3.90 (*)    Calcium 11.6 (*)    Albumin 2.8 (*)    AST 45 (*)    Alkaline Phosphatase 158 (*)    GFR calc non Af Amer 13 (*)    GFR calc Af Amer 16 (*)    All other components within normal limits  URINE MICROSCOPIC-ADD ON - Abnormal; Notable for the following:    Bacteria, UA MANY (*)    All other components within normal limits  URINE CULTURE  I-STAT CHEM 8, ED  I-STAT TROPOININ, ED  I-STAT CG4 LACTIC ACID, ED   Imaging Review Dg Chest Port 1 View  11/28/2013   CLINICAL DATA:  Shortness of breath and  nausea.  EXAM: PORTABLE CHEST - 1 VIEW  COMPARISON:  PA and lateral chest 09/01/2013.  FINDINGS: Lungs clear. Heart size normal. No pneumothorax or pleural effusion.  IMPRESSION: No acute disease.   Electronically Signed   By: Inge Rise M.D.   On: 11/28/2013 01:27    ED ECG REPORT  I personally interpreted this EKG   Date: 11/28/2013   Rate: 64  Rhythm: normal sinus rhythm  QRS Axis: left  Intervals: normal  ST/T Wave abnormalities: nonspecific T wave changes  Conduction Disutrbances:nonspecific intraventricular conduction delay  Narrative Interpretation:   Old EKG Reviewed: unchanged   MDM   Final diagnoses:  Altered mental status  UTI (lower urinary tract infection)    The nephrostomy tube sites appear clean and dry, no redness or drainage. The urine in the collecting bag appears dark and concentrated. His mental status is depressed which I suspect is related to both dehydration, possible progressive renal dysfunction or hyperkalemia and possibly urinary infection. I would also consider that this is medication related due to the recent starting of opiate medications. His EKG shows a left axis deviation and a normal sinus rhythm with a widened QRS.  Laboratory workup shows no leukocytosis, mild anemia, baseline renal failure since his last check one month ago. Urinalysis has large leukocytes  with many bacteria and too numerous to count white blood and red blood cells. My concern is that the patient has a urinary infection which is complicated given the presence of nephrostomy tubes. His altered mental status did improve somewhat with Narcan however this is likely multifactorial, antibiotics ordered, discussed with hospitalist for admission.   Meds given in ED:  The patient was given medications in the emergency department including Rocephin and IV fluids  Johnna Acosta, MD 11/28/13 [phone]

## 2013-11-28 NOTE — ED Notes (Signed)
CG4 Lactic results given to [facility].

## 2013-11-28 NOTE — ED Notes (Signed)
Per EMS, family sts pt has not been eating or drinking very well the past 2-3 days. Pt has hx of prostate cancer that has almost totally occluded his ureters. Pt has been producing a lot less urine in leg bags. Pt has been lethargic. A&Ox4.

## 2013-11-28 NOTE — Procedures (Signed)
PCN exchange 

## 2013-11-28 NOTE — ED Notes (Signed)
Admitting MD at bedside.

## 2013-11-28 NOTE — ED Notes (Signed)
Bed: WA17 Expected date:  Expected time:  Means of arrival:  Comments: EMS/resp distress/cancer pt

## 2013-11-28 NOTE — Progress Notes (Signed)
[facility] PROGRESS NOTE  Samuel Garner HBZ:[id number] DOB: 31-May-1933 DOA: 11/28/2013 PCP: Delphina Cahill, MD  Assessment/Plan: 1. AMS - likely multiple etiologies including dehydration, suspected UTI, active malignancy. 1. Cont on IVF hydration 2. Cont on empiric rocephin 3. There were concerns of possible narcotic overdose with pain meds weaned. Discussed with Palliative Care with recs for readdressing pain meds, especially given pt's cancer (see below) 2. AKI - mostly secondary to dehydration. Nephrostomy tubes changed out 11/28/13. Will cont to follow renal fx closely. Monitor i/o. 3. CKD - baseline Cr of around 2.5, presently 4.3 4. DM2 - given poor intake, holding actos and lantus. Cont SSI 5. Small Cell Prostate Ca 1. Greatly appreciate Palliative Care input. Situation appears grave at this time. Pt has since been declared DNR. Goals of care to be addressed following family meeting this weekend.  Code Status: DNR Family Communication: Pt in room (indicate person spoken with, relationship, and if by phone, the number) Disposition Plan: Pending   Consultants:  Palliative Care  IR  Procedures:  Nephrostomy tube change 11/28/13  Antibiotics:  Rocephin 11/27/13>>>  HPI/Subjective: No acute events noted overnight. Reports feeling well  Objective: Filed Vitals:   11/28/13 0330 11/28/13 0400 11/28/13 0450 11/28/13 1219  BP: 92/63 106/59 114/57   Pulse: 68  76   Temp:   97.5 F (36.4 C)   TempSrc:   Oral   Resp: 18 17 18    Height:    5\' 10"  (1.778 m)  Weight:    84.5 kg (186 lb 4.6 oz)  SpO2: 95%  96%    No intake or output data in the 24 hours ending 11/28/13 1333 Filed Weights   11/28/13 1219  Weight: 84.5 kg (186 lb 4.6 oz)    Exam:   General:  Awake, in nad  Cardiovascular: regular, s1, s2  Respiratory: normal resp effort, no wheezing  Abdomen: soft, nondistended  Musculoskeletal: perfused, no clubbing   Data Reviewed: Basic Metabolic  Panel:  Recent Labs Lab 11/28/13 0100 11/28/13 0115  NA 142 142  K 4.7 5.5*  CL 98 107  CO2 22  --   GLUCOSE 146* 140*  BUN 104* 119*  CREATININE 3.90* 4.30*  CALCIUM 11.6*  --    Liver Function Tests:  Recent Labs Lab 11/28/13 0100  AST 45*  ALT 19  ALKPHOS 158*  BILITOT 0.8  PROT 7.0  ALBUMIN 2.8*   No results found for this basename: LIPASE, AMYLASE,  in the last 168 hours No results found for this basename: AMMONIA,  in the last 168 hours CBC:  Recent Labs Lab 11/28/13 0100 11/28/13 0115  WBC 8.0  --   NEUTROABS 6.6  --   HGB 10.5* 10.9*  HCT 33.7* 32.0*  MCV 90.3  --   PLT 297  --    Cardiac Enzymes: No results found for this basename: CKTOTAL, CKMB, CKMBINDEX, TROPONINI,  in the last 168 hours BNP (last 3 results)  Recent Labs  09/01/13 0513  PROBNP 2536.0*   CBG:  Recent Labs Lab 11/28/13 0731 11/28/13 1215  GLUCAP 130* 106*    No results found for this or any previous visit (from the past 240 hour(s)).   Studies: Dg Chest Port 1 View  11/28/2013   CLINICAL DATA:  Shortness of breath and nausea.  EXAM: PORTABLE CHEST - 1 VIEW  COMPARISON:  PA and lateral chest 09/01/2013.  FINDINGS: Lungs clear. Heart size normal. No pneumothorax or pleural effusion.  IMPRESSION: No acute disease.  Electronically Signed   By: Inge Rise M.D.   On: 11/28/2013 01:27    Scheduled Meds: . calcitRIOL  0.25 mcg Oral Daily  . [START ON 11/29/2013] cefTRIAXone (ROCEPHIN)  IV  1 g Intravenous Q24H  . cholecalciferol  1,000 Units Oral Daily  . feeding supplement (ENSURE)  1 Container Oral TID BM  . heparin  5,000 Units Subcutaneous 3 times per day  . insulin aspart  0-9 Units Subcutaneous TID WC  . metoprolol  100 mg Oral BID  . pravastatin  40 mg Oral QHS  . verapamil  120 mg Oral QHS   Continuous Infusions: . sodium chloride 125 mL/hr at 11/28/13 1025    Principal Problem:   Altered mental status Active Problems:   Diabetes mellitus, type 2    Chronic kidney disease   Small cell carcinoma of prostate   ARF (acute renal failure)   UTI (lower urinary tract infection)   Dehydration   Protein-calorie malnutrition, severe  Time spent: 10min  [facility], [facility] Pager [phone]. If 7PM-7AM, please contact night-coverage at [email or web address], password [facility] 11/28/2013, 1:33 PM  LOS: 0 days

## 2013-11-28 NOTE — H&P (Signed)
[facility] History and Physical  Samuel Garner [id number] DOB: 14-Aug-1933 DOA: 11/28/2013  Referring physician: EDP PCP: Delphina Cahill, MD   Chief Complaint: AMS   HPI: Samuel Garner is a 80 y.o. male who presents to the ED with 4 day history of worsening mental status.  Per family he has been lethargic and sleeping all the time, very poor PO intake, drinking essentially no water at all.  Family states that this correlates with use of oxycodone which was recently prescribed for pain.  AMS has been gradually worsening during this time course.  He has been unable to eat, drink, or get out of bed for the past 4 days, until finally today in the ED he was administered narcan which woke him up.  Patient has complex medical history: he was recently diagnosed with prostate cancer which has caused urinary retention and bilateral hydronephrosis and kidney failure in January.  After failing bilateral urinary stents in January, he ultimately had B nephrostomy tubes placed.  These were supposed to be internalized in Feb but due to infection at one of the sites they were not and instead plans were made for follow up in 6-8 weeks at that time (in the next few days or so at this point he as appointment to be seen with IR according to family).  Review of Systems: Systems reviewed.  As above, otherwise negative  Past Medical History  Diagnosis Date  . Diabetes mellitus, type 2     with microalbuminuria  . Hypertension   . Arteriosclerotic cardiovascular disease (ASCVD)      2 vessel percutaneous transluminal coronary angioplasty in 3/93  . Hyperlipidemia     low HDL, high triglycerides  . Tobacco abuse, in remission     discontinued in 1993  . Gout   . Chronic kidney disease     creatinine 1.8 in 2002, 1.6 1n 2007 and 1.0 and 2008; deterioration in renal function; Secondary hyperparathyroidism  . Type IV renal tubular acidosis     With hyperkalemia  . Myocardial infarction     1993  .  Small cell carcinoma of prostate    Past Surgical History  Procedure Laterality Date  . Hernia repair    . Coronary angioplasty  1993  . Knee arthroscopy    . Transurethral resection of prostate N/A 08/29/2013    Procedure: TRANSURETHRAL RESECTION OF THE PROSTATE (TURP) and Clot evacuation;  Surgeon: Ardis Hughs, MD;  Location: [facility];  Service: Urology;  Laterality: N/A;   Social History:  reports that he quit smoking about 22 years ago. He has never used smokeless tobacco. He reports that he does not drink alcohol or use illicit drugs.  Allergies  Allergen Reactions  . Lisinopril     REACTION: acute renal failure: Patient is not familiar with this medication or an allergy    Family History  Problem Relation Age of Onset  . Diabetes Mother   . Heart attack Father      Prior to Admission medications   Medication Sig Start Date End Date Taking? Authorizing Provider  acetaminophen (TYLENOL) 500 MG tablet Take 500-1,000 mg by mouth every 6 (six) hours as needed for moderate pain or headache.    Yes Historical Provider, MD  calcitRIOL (ROCALTROL) 0.25 MCG capsule Take 0.25 mcg by mouth daily.  10/22/13  Yes Historical Provider, MD  cholecalciferol (VITAMIN D) 1000 UNITS tablet Take 1,000 Units by mouth daily.   Yes Historical Provider, MD  fenofibrate micronized Wendi Maya)  43 MG capsule Take 43 mg by mouth every morning.  01/08/13  Yes Historical Provider, MD  furosemide (LASIX) 40 MG tablet Take 40 mg by mouth every morning.   Yes Historical Provider, MD  insulin glargine (LANTUS) 100 UNIT/ML injection Inject 10 Units into the skin at bedtime.   Yes Historical Provider, MD  metoprolol (LOPRESSOR) 100 MG tablet Take 100 mg by mouth 2 (two) times daily.   Yes Historical Provider, MD  Multiple Vitamins-Minerals (PRESERVISION/LUTEIN) CAPS Take 1 capsule by mouth at bedtime.    Yes Historical Provider, MD  niacin 500 MG tablet Take 500 mg by mouth at bedtime.   Yes Historical Provider,  MD  oxyCODONE (OXY IR/ROXICODONE) 5 MG immediate release tablet Take 5 mg by mouth every 6 (six) hours as needed for severe pain.   Yes Historical Provider, MD  pioglitazone (ACTOS) 30 MG tablet Take 30 mg by mouth every morning.    Yes Historical Provider, MD  pravastatin (PRAVACHOL) 40 MG tablet Take 40 mg by mouth at bedtime.   Yes Historical Provider, MD  prochlorperazine (COMPAZINE) 10 MG tablet Take 1 tablet (10 mg total) by mouth every 6 (six) hours as needed for nausea or vomiting. 11/24/13  Yes Lora Paula, MD  traMADol (ULTRAM) 50 MG tablet Take 50 mg by mouth every 6 (six) hours as needed for moderate pain.   Yes Michelene Gardener., MD  verapamil (CALAN-SR) 120 MG CR tablet Take 1 tablet (120 mg total) by mouth at bedtime. 12/19/12  Yes Yehuda Savannah, MD  docusate sodium (COLACE) 100 MG capsule Take 100 mg by mouth 2 (two) times daily as needed for mild constipation.     Historical Provider, MD   Physical Exam: Filed Vitals:   11/28/13 0330  BP: 92/63  Pulse: 68  Temp:   Resp: 18    BP 92/63  Pulse 68  Temp(Src) 97.7 F (36.5 C) (Oral)  Resp 18  SpO2 95%  General Appearance:    Alert, oriented, no distress, appears stated age  Head:    Normocephalic, atraumatic  Eyes:    PERRL, EOMI, sclera non-icteric        Nose:   Nares without drainage or epistaxis. Mucosa, turbinates normal  Throat:   Dry mucous membranes. Oropharynx without erythema or exudate.  Neck:   Supple. No carotid bruits.  No thyromegaly.  No lymphadenopathy.   Back:     No CVA tenderness, no spinal tenderness  Lungs:     Clear to auscultation bilaterally, without wheezes, rhonchi or rales  Chest wall:    No tenderness to palpitation  Heart:    Regular rate and rhythm without murmurs, gallops, rubs  Abdomen:     Soft, non-tender, nondistended, normal bowel sounds, no organomegaly  Genitalia:    deferred  Rectal:    deferred  Extremities:   No clubbing, cyanosis or edema.  Pulses:   2+ and  symmetric all extremities  Skin:   Severely dehydrated.  Lymph nodes:   Cervical, supraclavicular, and axillary nodes normal  Neurologic:   CNII-XII intact. Normal strength, sensation and reflexes      throughout    Labs on Admission:  Basic Metabolic Panel:  Recent Labs Lab 11/28/13 0100  NA 142  K 4.7  CL 98  CO2 22  GLUCOSE 146*  BUN 104*  CREATININE 3.90*  CALCIUM 11.6*   Liver Function Tests:  Recent Labs Lab 11/28/13 0100  AST 45*  ALT 19  ALKPHOS  158*  BILITOT 0.8  PROT 7.0  ALBUMIN 2.8*   No results found for this basename: LIPASE, AMYLASE,  in the last 168 hours No results found for this basename: AMMONIA,  in the last 168 hours CBC:  Recent Labs Lab 11/28/13 0100  WBC 8.0  NEUTROABS 6.6  HGB 10.5*  HCT 33.7*  MCV 90.3  PLT 297   Cardiac Enzymes: No results found for this basename: CKTOTAL, CKMB, CKMBINDEX, TROPONINI,  in the last 168 hours  BNP (last 3 results)  Recent Labs  09/01/13 0513  PROBNP 2536.0*   CBG: No results found for this basename: GLUCAP,  in the last 168 hours  Radiological Exams on Admission: Dg Chest Port 1 View  11/28/2013   CLINICAL DATA:  Shortness of breath and nausea.  EXAM: PORTABLE CHEST - 1 VIEW  COMPARISON:  PA and lateral chest 09/01/2013.  FINDINGS: Lungs clear. Heart size normal. No pneumothorax or pleural effusion.  IMPRESSION: No acute disease.   Electronically Signed   By: Inge Rise M.D.   On: 11/28/2013 01:27    EKG: Independently reviewed.  Assessment/Plan Principal Problem:   Altered mental status Active Problems:   Diabetes mellitus, type 2   Chronic kidney disease   ARF (acute renal failure)   UTI (lower urinary tract infection)   Dehydration   1. AMS - multifactorial, essentially what likely happened is the patient began to take narcotics for mild pain (previously opiod naieve), and the dose of these was too much.  He likely has built up a significant level of the oxycodone in his  system as demonstrated by the fact that despite not taking any oxycodone at all today, he demonstrates marked improvement in mental status in the ED with administration of Narcan.  Additionally his AMS is complicated by AKI resulting from severe dehydration due to "essentially no fluid intake" over the past couple of days.  His UA is also suggestive of a UTI as well. 1. Unintentional opiod overdose - Stopping oxycodone completely for the moment, use tramadol if absolutely needed, patient is currently in no pain right now he states.  This may take a couple of days to clear out of his system with poor renal function. 2. UTI - rocephin daily, cultures pending 3. Dehydration - Patient severely dehydrated on exam, holding lasix, IVF at 125 cc/hr ordered 2. AKI - mostly secondary to dehydration, but there may be an element of post-renal obstruction too although the nephrostomy tubes do seem to both have at least some output.  Regarding his nephrostomy tubes, have put in the computer system for IR to evaluate the patient (and possibly change out tubes if this is needed) since per their note on Feb 10th it sounds like he would be due for this in the next couple of days or so. 3. CKD - patients baseline creatinine would seem to be about 2.5, apparently it was this last week at his nephrologists visit. 4. DM2 - given poor intake, holding actos and lantus (he wasn't taking lantus anyhow due to no PO intake), will instead cover with low dose SSI AC/HS.   Code Status: Full Code  Family Communication: Wife and son at bedside Disposition Plan: Admit to inpatient   Time spent: 46 min  GARDNER, JARED M. [facility] Pager [phone]  If 7AM-7PM, please contact the day team taking care of the patient [email or web address] Password [email or web address] 11/28/2013, 4:03 AM

## 2013-11-28 NOTE — Consult Note (Signed)
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  Prior Therapy: He is status post pelvic radiation between 09/22/2013 to 10/03/2013. He received a total of 30 gray in 10 fractions under the care of Dr. Tammi Klippel.  Current therapy: Observation and surveillance. MRI scheduled for 12-02-13 Interim History: Samuel Garner presents today for a followup visit. He is a pleasant gentleman I saw in consultation in January of 2015. He presented with obstructive uropathy and found to have a large pelvic mass arising from the prostate and a biopsy confirmed the presence of small cell cancer probably originating from the prostate. After a prolonged hospitalization in January of 2015, he was discharged home and completed his radiation therapy as mentioned on 10/03/2013. He has percutaneous nephrostomy tube which will be internalized in the future. He tolerated the radiation therapy without any major incident. Unfortunately his overall performance status continued to decline. His activity level is very limited and he is limited to a chair for the most part. He has not reported any bleeding or any discomfort.  Family reports continued physical and functional decline, and poor po intake.  Faced with advanced directive questions and anticipatory care needs   This NP Samuel Garner reviewed medical records, received report from team, assessed the patient and then meet at the patient's bedside along with his wife Samuel Garner to discuss diagnosis prognosis, GOC, EOL wishes  disposition and options.  A detailed discussion was had today regarding advanced directives.  Concepts specific to code status, artifical feeding and hydration,   rehospitalization was had.  The difference between a aggressive medical intervention path  and a palliative comfort care path for this patient at this time was had.  Values and goals of care important to patient and family were attempted to be elicited.  Concept of Hospice and Palliative Care were discussed  Natural trajectory and expectations at EOL were discussed.  Questions and concerns addressed.  Hard Choices booklet left for review. Family encouraged to call with questions or concerns.  PMT will continue to support holistically.   Goals of Care: 1.  Code Status: DNR/DNI   2. Scope of Treatment: 1. Vital Signs: per unit 2. Respiratory/Oxygen:as needed  3. Nutritional Support/Tube Feeds: no artifical feeding now or in the future 4. Antibiotics:yes 5. Blood Products:yes 6. IVF:yes 7. Review of Medications to be discontinued: continued present medical managemnt 8. Labs: yes  Hopeful for extended quality of life   4. Disposition: Dependant on clarifiction of [place] after Re meet with family on Sunday morning at 1000  3. Symptom Management:   1. Weakness: continue to treat the treatable until further clarification of [place] 2. Pain: Ultram 50 mg every 6 hrs prn  4. Psychosocial:  Emotional support offered to patient and wife at bedside.  This has been very difficult; patient has rapidly declined since recent diagnosis in Dec 2014  5. 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  Prior Therapy: He is status post pelvic radiation between 09/22/2013 to 10/03/2013. He received a total of 30 gray in 10 fractions under the care of Dr. Tammi Klippel.  Current therapy: Observation and surveillance. MRI scheduled for 12-02-13 Interim History: Samuel Garner presents today for a followup visit. He is a pleasant gentleman I saw in consultation in January of 2015. He presented with obstructive uropathy and found to have a large pelvic mass arising from the prostate and a biopsy confirmed the presence of small cell cancer probably originating from the prostate. After a prolonged hospitalization in January of 2015, he was discharged home and completed his radiation therapy as mentioned on 10/03/2013. He has percutaneous nephrostomy tube which will be internalized in the future. He tolerated the radiation therapy without any major incident. Unfortunately his overall performance status continued to decline. His activity level is very limited and he is limited to a chair for the most part. He has not reported any bleeding or any discomfort.  Family reports continued physical and functional decline, and poor po intake.    ROS: weakness, poor po intake, increased "sleepiness"     PMH:  Past Medical History  Diagnosis Date  . Diabetes mellitus, type 2     with microalbuminuria  . Hypertension   . Arteriosclerotic cardiovascular disease (ASCVD)      2 vessel percutaneous transluminal coronary angioplasty in 3/93  . Hyperlipidemia     low HDL, high triglycerides  . Tobacco abuse, in remission     discontinued in 1993  . Gout   . Chronic kidney disease     creatinine 1.8 in 2002, 1.6 1n 2007 and 1.0 and 2008; deterioration in renal function; Secondary hyperparathyroidism  . Type IV renal tubular acidosis     With hyperkalemia  . Myocardial infarction     1993  . Small cell carcinoma of prostate      PSH: Past Surgical History  Procedure Laterality Date  . Hernia repair    .  Coronary angioplasty  1993  . Knee arthroscopy    . Transurethral resection of prostate N/A 08/29/2013    Procedure: TRANSURETHRAL RESECTION OF THE PROSTATE (TURP) and Clot evacuation;  Surgeon: Ardis Hughs, MD;  Location: [facility];  Service: Urology;  Laterality: N/A;   I have reviewed the [place] and SH and  If appropriate update it with new information. Allergies  Allergen Reactions  . Lisinopril     REACTION: acute renal failure: Patient is not familiar with this medication or an allergy   Scheduled Meds: . calcitRIOL  0.25 mcg Oral Daily  . [START ON 11/29/2013] cefTRIAXone (ROCEPHIN)  IV  1 g Intravenous Q24H  . cholecalciferol  1,000 Units Oral Daily  . feeding supplement (ENSURE)  1 Container Oral TID BM  . heparin  5,000 Units Subcutaneous 3 times per day  . insulin aspart  0-9 Units Subcutaneous TID WC  . metoprolol  100 mg Oral BID  . pravastatin  40 mg Oral QHS  . verapamil  120 mg Oral QHS   Continuous Infusions: . sodium chloride 125 mL/hr at 11/28/13 1025   PRN Meds:.acetaminophen, docusate sodium, iohexol, ondansetron (ZOFRAN) IV, prochlorperazine, traMADol    BP 114/57  Pulse 76  Temp(Src)  97.5 F (36.4 C) (Oral)  Resp 18  SpO2 96%   PPS:  30 % at best  No intake or output data in the 24 hours ending 11/28/13 1204  Physical Exam:  General: chronically ill appearing, lethargic, barely audible voice  HEENT:  Dry buccal membranes, no exudate Chest: decreased in bases, CTA CVS: RRR Abdomen soft NT +BS Ext: moves all four, no edema Neuro: oriented X3  Labs: CBC    Component Value Date/Time   WBC 8.0 11/28/2013 0100   WBC 5.8 10/16/2013 1543   RBC 3.73* 11/28/2013 0100   RBC 2.96* 10/16/2013 1543   HGB 10.9* 11/28/2013 0115   HGB 8.5* 10/16/2013 1543   HCT 32.0* 11/28/2013 0115   HCT 25.8* 10/16/2013 1543   PLT 297 11/28/2013 0100   PLT 389 10/16/2013 1543   MCV 90.3 11/28/2013 0100   MCV 87.2 10/16/2013 1543   MCH 28.2 11/28/2013 0100   MCH 28.8 10/16/2013  1543   MCHC 31.2 11/28/2013 0100   MCHC 33.0 10/16/2013 1543   RDW 16.9* 11/28/2013 0100   RDW 16.5* 10/16/2013 1543   LYMPHSABS 0.7 11/28/2013 0100   LYMPHSABS 0.3* 10/16/2013 1543   MONOABS 0.6 11/28/2013 0100   MONOABS 0.2 10/16/2013 1543   EOSABS 0.1 11/28/2013 0100   EOSABS 0.0 10/16/2013 1543   BASOSABS 0.0 11/28/2013 0100   BASOSABS 0.0 10/16/2013 1543    BMET    Component Value Date/Time   NA 142 11/28/2013 0115   NA 135* 10/16/2013 1543   K 5.5* 11/28/2013 0115   K 4.4 10/16/2013 1543   CL 107 11/28/2013 0115   CO2 22 11/28/2013 0100   CO2 21* 10/16/2013 1543   GLUCOSE 140* 11/28/2013 0115   GLUCOSE 274* 10/16/2013 1543   BUN 119* 11/28/2013 0115   BUN 91.9* 10/16/2013 1543   CREATININE 4.30* 11/28/2013 0115   CREATININE 4.7* 10/16/2013 1543   CREATININE 2.39* 01/28/2013 1133   CALCIUM 11.6* 11/28/2013 0100   CALCIUM 9.6 10/16/2013 1543   GFRNONAA 13* 11/28/2013 0100   GFRAA 16* 11/28/2013 0100    CMP     Component Value Date/Time   NA 142 11/28/2013 0115   NA 135* 10/16/2013 1543   K 5.5* 11/28/2013 0115   K 4.4 10/16/2013 1543   CL 107 11/28/2013 0115   CO2 22 11/28/2013 0100   CO2 21* 10/16/2013 1543   GLUCOSE 140* 11/28/2013 0115   GLUCOSE 274* 10/16/2013 1543   BUN 119* 11/28/2013 0115   BUN 91.9* 10/16/2013 1543   CREATININE 4.30* 11/28/2013 0115   CREATININE 4.7* 10/16/2013 1543   CREATININE 2.39* 01/28/2013 1133   CALCIUM 11.6* 11/28/2013 0100   CALCIUM 9.6 10/16/2013 1543   PROT 7.0 11/28/2013 0100   PROT 6.5 10/16/2013 1543   ALBUMIN 2.8* 11/28/2013 0100   ALBUMIN 2.1* 10/16/2013 1543   AST 45* 11/28/2013 0100   AST 38* 10/16/2013 1543   ALT 19 11/28/2013 0100   ALT 31 10/16/2013 1543   ALKPHOS 158* 11/28/2013 0100   ALKPHOS 113 10/16/2013 1543   BILITOT 0.8 11/28/2013 0100   BILITOT 0.55 10/16/2013 1543   GFRNONAA 13* 11/28/2013 0100   GFRAA 16* 11/28/2013 0100       Time In Time Out Total Time Spent with Patient Total Overall Time  1300 1430 80 min 90 min    Greater than 50%  of this time was spent  counseling and coordinating care related to the above assessment and plan.  Samuel Lessen NP  Palliative Medicine Team Team Phone # [phone] Pager [phone]  Discussed with Dr Earlie Counts

## 2013-11-29 ENCOUNTER — Inpatient Hospital Stay (HOSPITAL_COMMUNITY): Payer: Medicare Other

## 2013-11-29 DIAGNOSIS — R531 Weakness: Secondary | ICD-10-CM

## 2013-11-29 DIAGNOSIS — R338 Other retention of urine: Secondary | ICD-10-CM

## 2013-11-29 DIAGNOSIS — N3289 Other specified disorders of bladder: Secondary | ICD-10-CM

## 2013-11-29 DIAGNOSIS — Z515 Encounter for palliative care: Secondary | ICD-10-CM

## 2013-11-29 LAB — BASIC METABOLIC PANEL
BUN: 108 mg/dL — ABNORMAL HIGH (ref 6–23)
CALCIUM: 10.3 mg/dL (ref 8.4–10.5)
CO2: 20 mEq/L (ref 19–32)
Chloride: 106 mEq/L (ref 96–112)
Creatinine, Ser: 4.5 mg/dL — ABNORMAL HIGH (ref 0.50–1.35)
GFR calc non Af Amer: 11 mL/min — ABNORMAL LOW (ref >90)
GFR, EST AFRICAN AMERICAN: 13 mL/min — AB (ref >90)
GLUCOSE: 119 mg/dL — AB (ref 70–99)
Potassium: 4.8 mEq/L (ref 3.7–5.3)
Sodium: 145 mEq/L (ref 137–147)

## 2013-11-29 LAB — GLUCOSE, CAPILLARY
Glucose-Capillary: 121 mg/dL — ABNORMAL HIGH (ref 70–99)
Glucose-Capillary: 125 mg/dL — ABNORMAL HIGH (ref 70–99)
Glucose-Capillary: 130 mg/dL — ABNORMAL HIGH (ref 70–99)
Glucose-Capillary: 137 mg/dL — ABNORMAL HIGH (ref 70–99)
Glucose-Capillary: 94 mg/dL (ref 70–99)

## 2013-11-29 MED ORDER — HYDROCERIN EX CREA
TOPICAL_CREAM | Freq: Two times a day (BID) | CUTANEOUS | Status: DC
  Administered 2013-11-29 – 2013-12-01 (×5): via TOPICAL
  Filled 2013-11-29: qty 113

## 2013-11-29 NOTE — Progress Notes (Signed)
[facility] PROGRESS NOTE  Samuel Garner ENI:[id number] DOB: Dec 03, 1932 DOA: 11/28/2013 PCP: Delphina Cahill, MD  Assessment/Plan: 1. AMS - likely multiple etiologies including dehydration, suspected UTI, active malignancy. 1. Cont on IVF hydration 2. Cont on empiric rocephin 3. There were initial concerns of possible narcotic overdose with pain meds subsequently weaned. Discussed with Palliative Care with recs for readdressing pain meds, especially given pt's active cancer (see below) 2. AKI - mostly secondary to dehydration. Nephrostomy tubes changed out 11/28/13. Will cont to follow renal fx closely. Monitor i/o. Remains on 125cc/hr saline. Will hold nephrotoxic agents. Tramadol d/c'd 3. CKD - baseline Cr of around 2.5, presently 4.5 4. DM2 - given poor intake, holding actos and lantus. Cont SSI. Glucose holding steady. 5. Small Cell Prostate Ca 1. Greatly appreciate Palliative Care input. Situation appears grave at this time. Pt has since been declared DNR. Goals of care to be addressed following family meeting this tomorrow. 6. Diabetic foot ulcer 1. Open fissure under 3rd toe of L foot 2. No active draining 3. No purulence 4. No surrounding erythema 5. Suspect caused by dry, chapped and cracked skin 6. Add eucerin cream for remaining foot 7. Will consult Wound Care for assistance 7. Subjective SOB 1. In the setting of 125cc/hr IVF, will obtained cxr to r/o flash pulm edema - no evidence of pulm edema per my read 2. On minimal o2 support 3. No crackles on exam  Code Status: DNR Family Communication: Pt in room Disposition Plan: Pending   Consultants:  Palliative Care  IR  Procedures:  Nephrostomy tube change 11/28/13  Antibiotics:  Rocephin 11/27/13>>>  HPI/Subjective: No overnight events noted.  Objective: Filed Vitals:   11/28/13 1219 11/28/13 1345 11/28/13 2152 11/29/13 0639  BP:  101/56 121/56 114/60  Pulse:  62 69 67  Temp:  97.5 F (36.4 C) 98.4 F  (36.9 C) 97.3 F (36.3 C)  TempSrc:  Oral Oral Oral  Resp:  16 17 18   Height: 5\' 10"  (1.778 m)     Weight: 84.5 kg (186 lb 4.6 oz)     SpO2:  98% 94% 98%    Intake/Output Summary (Last 24 hours) at 11/29/13 1157 Last data filed at 11/29/13 1052  Gross per 24 hour  Intake 1975.41 ml  Output    300 ml  Net 1675.41 ml   Filed Weights   11/28/13 1219  Weight: 84.5 kg (186 lb 4.6 oz)    Exam:   General:  Awake, in nad  Cardiovascular: regular, s1, s2  Respiratory: normal resp effort, no wheezing  Abdomen: soft, nondistended  Musculoskeletal: perfused, no clubbing   Data Reviewed: Basic Metabolic Panel:  Recent Labs Lab 11/28/13 0100 11/28/13 0115 11/29/13 0421  NA 142 142 145  K 4.7 5.5* 4.8  CL 98 107 106  CO2 22  --  20  GLUCOSE 146* 140* 119*  BUN 104* 119* 108*  CREATININE 3.90* 4.30* 4.50*  CALCIUM 11.6*  --  10.3   Liver Function Tests:  Recent Labs Lab 11/28/13 0100  AST 45*  ALT 19  ALKPHOS 158*  BILITOT 0.8  PROT 7.0  ALBUMIN 2.8*   No results found for this basename: LIPASE, AMYLASE,  in the last 168 hours No results found for this basename: AMMONIA,  in the last 168 hours CBC:  Recent Labs Lab 11/28/13 0100 11/28/13 0115  WBC 8.0  --   NEUTROABS 6.6  --   HGB 10.5* 10.9*  HCT 33.7* 32.0*  MCV 90.3  --  PLT 297  --    Cardiac Enzymes: No results found for this basename: CKTOTAL, CKMB, CKMBINDEX, TROPONINI,  in the last 168 hours BNP (last 3 results)  Recent Labs  09/01/13 0513  PROBNP 2536.0*   CBG:  Recent Labs Lab 11/28/13 1215 11/28/13 1702 11/28/13 2146 11/29/13 0019 11/29/13 0752  GLUCAP 106* 122* 87 94 121*    Recent Results (from the past 240 hour(s))  URINE CULTURE     Status: None   Collection Time    11/28/13  1:34 AM      Result Value Ref Range Status   Specimen Description URINE, RANDOM   Final   Special Requests NONE   Final   Culture  Setup Time     Final   Value: 11/28/2013 08:59      Performed at [facility] PENDING   Incomplete   Culture     Final   Value: Culture reincubated for better growth     Performed at [facility]   Report Status PENDING   Incomplete     Studies: Ir Nephrostomy Tube Change  11/28/2013   CLINICAL DATA:  Bilateral nephrostomy catheter.  Prostate carcinoma.  EXAM: PERC TUBE CHG W/CM  FLUOROSCOPY TIME:  42 seconds.  MEDICATIONS AND MEDICAL HISTORY: None  ANESTHESIA/SEDATION: None  CONTRAST:  20 cc Omnipaque 300  PROCEDURE: The procedure, risks, benefits, and alternatives were explained to the patient. Questions regarding the procedure were encouraged and answered. The patient understands and consents to the procedure.  The back was prepped with Betadine in a sterile fashion, and a sterile drape was applied covering the operative field. A sterile gown and sterile gloves were used for the procedure.  1% lidocaine was utilized for local anesthesia. The existing left nephrostomy was cut and exchanged over a Bentson wire for a new 10 French nephrostomy catheter. It was coiled, string fixed, and sewn to the skin, positioned in the renal pelvis. Contrast was injected. The identical procedure was performed for the right kidney.  FINDINGS: Images document exchange of the right and left percutaneous nephrostomy catheters.  COMPLICATIONS: None  IMPRESSION: Successful bilateral percutaneous nephrostomy catheter exchange.   Electronically Signed   By: Maryclare Bean M.D.   On: 11/28/2013 16:32   Ir Nephrostomy Tube Change  11/28/2013   CLINICAL DATA:  Bilateral nephrostomy catheter.  Prostate carcinoma.  EXAM: PERC TUBE CHG W/CM  FLUOROSCOPY TIME:  42 seconds.  MEDICATIONS AND MEDICAL HISTORY: None  ANESTHESIA/SEDATION: None  CONTRAST:  20 cc Omnipaque 300  PROCEDURE: The procedure, risks, benefits, and alternatives were explained to the patient. Questions regarding the procedure were encouraged and answered. The patient understands and consents  to the procedure.  The back was prepped with Betadine in a sterile fashion, and a sterile drape was applied covering the operative field. A sterile gown and sterile gloves were used for the procedure.  1% lidocaine was utilized for local anesthesia. The existing left nephrostomy was cut and exchanged over a Bentson wire for a new 10 French nephrostomy catheter. It was coiled, string fixed, and sewn to the skin, positioned in the renal pelvis. Contrast was injected. The identical procedure was performed for the right kidney.  FINDINGS: Images document exchange of the right and left percutaneous nephrostomy catheters.  COMPLICATIONS: None  IMPRESSION: Successful bilateral percutaneous nephrostomy catheter exchange.   Electronically Signed   By: Maryclare Bean M.D.   On: 11/28/2013 16:32   Dg Chest Port 1  View  11/28/2013   CLINICAL DATA:  Shortness of breath and nausea.  EXAM: PORTABLE CHEST - 1 VIEW  COMPARISON:  PA and lateral chest 09/01/2013.  FINDINGS: Lungs clear. Heart size normal. No pneumothorax or pleural effusion.  IMPRESSION: No acute disease.   Electronically Signed   By: Inge Rise M.D.   On: 11/28/2013 01:27    Scheduled Meds: . calcitRIOL  0.25 mcg Oral Daily  . cefTRIAXone (ROCEPHIN)  IV  1 g Intravenous Q24H  . cholecalciferol  1,000 Units Oral Daily  . feeding supplement (ENSURE)  1 Container Oral TID BM  . heparin  5,000 Units Subcutaneous 3 times per day  . insulin aspart  0-9 Units Subcutaneous TID WC  . metoprolol  100 mg Oral BID  . pravastatin  40 mg Oral QHS  . verapamil  120 mg Oral QHS   Continuous Infusions: . sodium chloride 125 mL/hr at 11/29/13 0059    Principal Problem:   Altered mental status Active Problems:   Diabetes mellitus, type 2   Chronic kidney disease   Small cell carcinoma of prostate   ARF (acute renal failure)   UTI (lower urinary tract infection)   Dehydration   Protein-calorie malnutrition, severe  Time spent: 42min  Remijio Holleran, [facility] Pager [phone]. If 7PM-7AM, please contact night-coverage at [email or web address], password [facility] 11/29/2013, 11:57 AM  LOS: 1 day

## 2013-11-30 DIAGNOSIS — R52 Pain, unspecified: Secondary | ICD-10-CM

## 2013-11-30 DIAGNOSIS — Z515 Encounter for palliative care: Secondary | ICD-10-CM

## 2013-11-30 DIAGNOSIS — I10 Essential (primary) hypertension: Secondary | ICD-10-CM

## 2013-11-30 DIAGNOSIS — C61 Malignant neoplasm of prostate: Secondary | ICD-10-CM

## 2013-11-30 LAB — BASIC METABOLIC PANEL
BUN: 105 mg/dL — ABNORMAL HIGH (ref 6–23)
CO2: 18 meq/L — AB (ref 19–32)
CREATININE: 4.74 mg/dL — AB (ref 0.50–1.35)
Calcium: 10 mg/dL (ref 8.4–10.5)
Chloride: 106 mEq/L (ref 96–112)
GFR calc Af Amer: 12 mL/min — ABNORMAL LOW (ref >90)
GFR, EST NON AFRICAN AMERICAN: 11 mL/min — AB (ref >90)
Glucose, Bld: 152 mg/dL — ABNORMAL HIGH (ref 70–99)
Potassium: 4.4 mEq/L (ref 3.7–5.3)
Sodium: 144 mEq/L (ref 137–147)

## 2013-11-30 LAB — GLUCOSE, CAPILLARY
GLUCOSE-CAPILLARY: 159 mg/dL — AB (ref 70–99)
Glucose-Capillary: 128 mg/dL — ABNORMAL HIGH (ref 70–99)
Glucose-Capillary: 111 mg/dL — ABNORMAL HIGH (ref 70–99)
Glucose-Capillary: 130 mg/dL — ABNORMAL HIGH (ref 70–99)

## 2013-11-30 MED ORDER — NYSTATIN 100000 UNIT/ML MT SUSP: 5.0000 mL | Freq: Four times a day (QID) | OROMUCOSAL | Status: DC

## 2013-11-30 MED ORDER — MAGIC MOUTHWASH
5.0000 mL | Freq: Four times a day (QID) | ORAL | Status: DC | PRN
  Administered 2013-11-30: 5 mL via ORAL
  Filled 2013-11-30: qty 5

## 2013-11-30 MED ORDER — MORPHINE SULFATE (CONCENTRATE) 10 MG /0.5 ML PO SOLN
5.0000 mg | ORAL | Status: DC | PRN
  Administered 2013-12-01: 5 mg via ORAL
  Filled 2013-11-30: qty 0.5

## 2013-11-30 MED ORDER — GUAIFENESIN ER 600 MG PO TB12
600.0000 mg | ORAL_TABLET | Freq: Two times a day (BID) | ORAL | Status: DC
  Administered 2013-11-30: 600 mg via ORAL
  Filled 2013-11-30 (×4): qty 1

## 2013-11-30 NOTE — Progress Notes (Signed)
[facility] PROGRESS NOTE  Samuel Garner VQM:[id number] DOB: [birth year] DOA: 11/28/2013 PCP: Delphina Cahill, MD  Assessment/Plan: 1. AMS - likely multiple etiologies including dehydration, suspected UTI, active malignancy. 1. Cont on IVF hydration 2. Cont on empiric rocephin 3. There were initial concerns of possible narcotic overdose with pain meds subsequently weaned. Discussed with Palliative Care with recs for readdressing pain meds, especially given pt's active cancer (see below) 2. AKI - mostly secondary to dehydration. Nephrostomy tubes changed out 11/28/13. Will cont to follow renal fx closely. Monitor i/o. Remains on 125cc/hr saline. Will hold nephrotoxic agents. Tramadol d/c'd 3. CKD - baseline Cr of around 2.5, presently 4.7 4. DM2 - given poor intake, holding actos and lantus. Cont SSI. Glucose holding steady. 5. Small Cell Prostate Ca 1. Greatly appreciate Palliative Care input. DNR. Goals of care noted. 6. Diabetic foot ulcer 1. Open fissure under 3rd toe of L foot 2. No active draining 3. No purulence 4. No surrounding erythema 5. Suspect caused by dry, chapped and cracked skin 6. Added eucerin cream for remainder of foot 7. Subjective SOB 1. In the setting of 125cc/hr IVF, will obtained cxr to r/o flash pulm edema - no evidence of pulm edema per my read 2. On minimal o2 support 3. No crackles on exam 8. Thrush 1. Magic mouthwash ordered  Code Status: DNR Family Communication: Pt in room Disposition Plan: Pending   Consultants:  Palliative Care  IR  Procedures:  Nephrostomy tube change 11/28/13  Antibiotics:  Rocephin 11/27/13>>>  HPI/Subjective: No acute events noted overnight.  Objective: Filed Vitals:   11/29/13 1414 11/29/13 2029 11/30/13 0453 11/30/13 1335  BP: 105/59 107/54 100/54 105/72  Pulse:  67 59 69  Temp:  98 F (36.7 C) 97.3 F (36.3 C) 98 F (36.7 C)  TempSrc:  Oral Oral Oral  Resp:  18 18 16   Height:      Weight:       SpO2:  98% 98% 97%    Intake/Output Summary (Last 24 hours) at 11/30/13 1657 Last data filed at 11/30/13 0800  Gross per 24 hour  Intake   2325 ml  Output    200 ml  Net   2125 ml   Filed Weights   11/28/13 1219  Weight: 84.5 kg (186 lb 4.6 oz)    Exam:   General:  Awake, in nad  Cardiovascular: regular, s1, s2  Respiratory: normal resp effort, no wheezing  Abdomen: soft, nondistended  Musculoskeletal: perfused, no clubbing   Data Reviewed: Basic Metabolic Panel:  Recent Labs Lab 11/28/13 0100 11/28/13 0115 11/29/13 0421 11/30/13 0611  NA 142 142 145 144  K 4.7 5.5* 4.8 4.4  CL 98 107 106 106  CO2 22  --  20 18*  GLUCOSE 146* 140* 119* 152*  BUN 104* 119* 108* 105*  CREATININE 3.90* 4.30* 4.50* 4.74*  CALCIUM 11.6*  --  10.3 10.0   Liver Function Tests:  Recent Labs Lab 11/28/13 0100  AST 45*  ALT 19  ALKPHOS 158*  BILITOT 0.8  PROT 7.0  ALBUMIN 2.8*   No results found for this basename: LIPASE, AMYLASE,  in the last 168 hours No results found for this basename: AMMONIA,  in the last 168 hours CBC:  Recent Labs Lab 11/28/13 0100 11/28/13 0115  WBC 8.0  --   NEUTROABS 6.6  --   HGB 10.5* 10.9*  HCT 33.7* 32.0*  MCV 90.3  --   PLT 297  --  Cardiac Enzymes: No results found for this basename: CKTOTAL, CKMB, CKMBINDEX, TROPONINI,  in the last 168 hours BNP (last 3 results)  Recent Labs  09/01/13 0513  PROBNP 2536.0*   CBG:  Recent Labs Lab 11/29/13 0752 11/29/13 1202 11/29/13 1651 11/29/13 2117 11/30/13 0741  GLUCAP 121* 125* 137* 130* 159*    Recent Results (from the past 240 hour(s))  URINE CULTURE     Status: None   Collection Time    11/28/13  1:34 AM      Result Value Ref Range Status   Specimen Description URINE, RANDOM   Final   Special Requests NONE   Final   Culture  Setup Time     Final   Value: 11/28/2013 08:59     Performed at [facility]     Final   Value: 40,000 COLONIES/ML      Performed at [facility]   Culture     Final   Value: ENTEROCOCCUS SPECIES     Performed at [facility]   Report Status PENDING   Incomplete     Studies: Dg Chest Port 1 View  11/29/2013   CLINICAL DATA:  Shortness of breath  EXAM: PORTABLE CHEST - 1 VIEW  COMPARISON:  11/28/2013  FINDINGS: Mild linear scarring in the right mid lung. Lungs otherwise clear. No pleural effusion or pneumothorax.  The heart is top-normal in size.  IMPRESSION: No evidence of acute cardiopulmonary disease.   Electronically Signed   By: Julian Hy M.D.   On: 11/29/2013 18:25    Scheduled Meds: . cefTRIAXone (ROCEPHIN)  IV  1 g Intravenous Q24H  . feeding supplement (ENSURE)  1 Container Oral TID BM  . guaiFENesin  600 mg Oral BID  . heparin  5,000 Units Subcutaneous 3 times per day  . hydrocerin   Topical BID  . insulin aspart  0-9 Units Subcutaneous TID WC  . metoprolol  100 mg Oral BID  . verapamil  120 mg Oral QHS   Continuous Infusions: . sodium chloride 125 mL/hr at 11/30/13 0121    Principal Problem:   Altered mental status Active Problems:   Diabetes mellitus, type 2   Chronic kidney disease   Small cell carcinoma of prostate   ARF (acute renal failure)   UTI (lower urinary tract infection)   Dehydration   Protein-calorie malnutrition, severe   Palliative care encounter   Weakness generalized   Pain, generalized  Time spent: 12min  CH[facility], [facility] Pager [phone]. If 7PM-7AM, please contact night-coverage at [email or web address], password [facility] 11/30/2013, 4:57 PM  LOS: 2 days

## 2013-11-30 NOTE — Progress Notes (Signed)
Progress Note from the Palliative Medicine Team at [facility]:  -patient is lethargic   -large loving family gathered at bedside for continued conversation regarding GOC, anticipatory care needs, options and symptom management   -plan is for home with hospice but family really needs to have Dr Hazeline Junker input before discharge.  I will call Dr Alen Blew fist thing in the morning in hopes that he will be able to touch bases with this family   Objective: Allergies  Allergen Reactions  . Lisinopril     REACTION: acute renal failure: Patient is not familiar with this medication or an allergy   Scheduled Meds: . calcitRIOL  0.25 mcg Oral Daily  . cefTRIAXone (ROCEPHIN)  IV  1 g Intravenous Q24H  . feeding supplement (ENSURE)  1 Container Oral TID BM  . heparin  5,000 Units Subcutaneous 3 times per day  . hydrocerin   Topical BID  . insulin aspart  0-9 Units Subcutaneous TID WC  . metoprolol  100 mg Oral BID  . verapamil  120 mg Oral QHS   Continuous Infusions: . sodium chloride 125 mL/hr at 11/30/13 0121   PRN Meds:.acetaminophen, docusate sodium, morphine CONCENTRATE, ondansetron (ZOFRAN) IV, prochlorperazine  BP 100/54  Pulse 59  Temp(Src) 97.3 F (36.3 C) (Oral)  Resp 18  Ht 5\' 10"  (1.778 m)  Wt 84.5 kg (186 lb 4.6 oz)  BMI 26.73 kg/m2  SpO2 98%   PPS:30 % at best  Pain Score: "last night was bad" 6/10    Intake/Output Summary (Last 24 hours) at 11/30/13 1111 Last data filed at 11/30/13 0800  Gross per 24 hour  Intake   3365 ml  Output    200 ml  Net   3165 ml        Physical Exam:  General: chronically ill appearing, lethargic, barely audible voice  -patient cannot stay alert for meeting HEENT: Dry buccal membranes, no exudate  Chest: decreased in bases, CTA  CVS: RRR  Abdomen soft NT +BS  Ext: moves all four, no edema  Neuro: oriented X3   Labs: CBC    Component Value Date/Time   WBC 8.0 11/28/2013 0100   WBC 5.8 10/16/2013 1543   RBC 3.73*  11/28/2013 0100   RBC 2.96* 10/16/2013 1543   HGB 10.9* 11/28/2013 0115   HGB 8.5* 10/16/2013 1543   HCT 32.0* 11/28/2013 0115   HCT 25.8* 10/16/2013 1543   PLT 297 11/28/2013 0100   PLT 389 10/16/2013 1543   MCV 90.3 11/28/2013 0100   MCV 87.2 10/16/2013 1543   MCH 28.2 11/28/2013 0100   MCH 28.8 10/16/2013 1543   MCHC 31.2 11/28/2013 0100   MCHC 33.0 10/16/2013 1543   RDW 16.9* 11/28/2013 0100   RDW 16.5* 10/16/2013 1543   LYMPHSABS 0.7 11/28/2013 0100   LYMPHSABS 0.3* 10/16/2013 1543   MONOABS 0.6 11/28/2013 0100   MONOABS 0.2 10/16/2013 1543   EOSABS 0.1 11/28/2013 0100   EOSABS 0.0 10/16/2013 1543   BASOSABS 0.0 11/28/2013 0100   BASOSABS 0.0 10/16/2013 1543    BMET    Component Value Date/Time   NA 144 11/30/2013 0611   NA 135* 10/16/2013 1543   K 4.4 11/30/2013 0611   K 4.4 10/16/2013 1543   CL 106 11/30/2013 0611   CO2 18* 11/30/2013 0611   CO2 21* 10/16/2013 1543   GLUCOSE 152* 11/30/2013 0611   GLUCOSE 274* 10/16/2013 1543   BUN 105* 11/30/2013 0611   BUN 91.9* 10/16/2013 1543  CREATININE 4.74* 11/30/2013 0611   CREATININE 4.7* 10/16/2013 1543   CREATININE 2.39* 01/28/2013 1133   CALCIUM 10.0 11/30/2013 0611   CALCIUM 9.6 10/16/2013 1543   GFRNONAA 11* 11/30/2013 0611   GFRAA 12* 11/30/2013 0611    CMP     Component Value Date/Time   NA 144 11/30/2013 0611   NA 135* 10/16/2013 1543   K 4.4 11/30/2013 0611   K 4.4 10/16/2013 1543   CL 106 11/30/2013 0611   CO2 18* 11/30/2013 0611   CO2 21* 10/16/2013 1543   GLUCOSE 152* 11/30/2013 0611   GLUCOSE 274* 10/16/2013 1543   BUN 105* 11/30/2013 0611   BUN 91.9* 10/16/2013 1543   CREATININE 4.74* 11/30/2013 0611   CREATININE 4.7* 10/16/2013 1543   CREATININE 2.39* 01/28/2013 1133   CALCIUM 10.0 11/30/2013 0611   CALCIUM 9.6 10/16/2013 1543   PROT 7.0 11/28/2013 0100   PROT 6.5 10/16/2013 1543   ALBUMIN 2.8* 11/28/2013 0100   ALBUMIN 2.1* 10/16/2013 1543   AST 45* 11/28/2013 0100   AST 38* 10/16/2013 1543   ALT 19 11/28/2013 0100   ALT 31 10/16/2013 1543   ALKPHOS 158* 11/28/2013 0100    ALKPHOS 113 10/16/2013 1543   BILITOT 0.8 11/28/2013 0100   BILITOT 0.55 10/16/2013 1543   GFRNONAA 11* 11/30/2013 0611   GFRAA 12* 11/30/2013 0611    Assessment and Plan: 1. Code Status: DNR/DNI-comfort is main focus of care                             -minimize medications for comfort 2. Symptom Control:          Pain/Dyspnea: Roxanol 5 mg po every 4 hrs prn  3. Psycho/Social:  Emotional support to family and patient.  It is hard for everyone to come to terms with the limited prognosis,  They voice love for each and other and hope to get the patient home for focus of comfort and dignity  4. Spiritual:  Strong community church support  5. Disposition:  Hopeful for home with hospice  Patient Documents Completed or Given: Document Given Completed  Advanced Directives Pkt    MOST  yes  DNR    Gone from My Sight    Hard Choices yes     Time In Time Out Total Time Spent with Patient Total Overall Time  1000 1100 60 min 60 min    Greater than 50%  of this time was spent counseling and coordinating care related to the above assessment and plan.  Wadie Lessen NP  Palliative Medicine Team Team Phone # [phone] Pager [phone]  Discussed with Dr Wyline Copas 1

## 2013-11-30 NOTE — Progress Notes (Signed)
Per Wadie Lessen, NP, d/c plan will be home with Hospice.  No CSW needs identified.  Please consult CSW, should Pt's d/c needs change.  Bernita Raisin, [facility] [phone]

## 2013-12-01 ENCOUNTER — Other Ambulatory Visit: Payer: Medicare Other

## 2013-12-01 ENCOUNTER — Ambulatory Visit: Payer: Medicare Other | Admitting: Radiation Oncology

## 2013-12-01 DIAGNOSIS — R110 Nausea: Secondary | ICD-10-CM

## 2013-12-01 DIAGNOSIS — N139 Obstructive and reflux uropathy, unspecified: Secondary | ICD-10-CM

## 2013-12-01 LAB — BASIC METABOLIC PANEL
BUN: 103 mg/dL — ABNORMAL HIGH (ref 6–23)
CHLORIDE: 108 meq/L (ref 96–112)
CO2: 16 mEq/L — ABNORMAL LOW (ref 19–32)
Calcium: 9.8 mg/dL (ref 8.4–10.5)
Creatinine, Ser: 4.97 mg/dL — ABNORMAL HIGH (ref 0.50–1.35)
GFR, EST AFRICAN AMERICAN: 12 mL/min — AB (ref >90)
GFR, EST NON AFRICAN AMERICAN: 10 mL/min — AB (ref >90)
Glucose, Bld: 142 mg/dL — ABNORMAL HIGH (ref 70–99)
POTASSIUM: 4.8 meq/L (ref 3.7–5.3)
SODIUM: 145 meq/L (ref 137–147)

## 2013-12-01 LAB — URINE CULTURE: Colony Count: 40000

## 2013-12-01 LAB — GLUCOSE, CAPILLARY
Glucose-Capillary: 135 mg/dL — ABNORMAL HIGH (ref 70–99)
Glucose-Capillary: 162 mg/dL — ABNORMAL HIGH (ref 70–99)

## 2013-12-01 MED ORDER — DEXAMETHASONE 2 MG PO TABS: 2.0000 mg | ORAL_TABLET | Freq: Two times a day (BID) | ORAL | Status: AC

## 2013-12-01 MED ORDER — ENSURE PUDDING PO PUDG: 1.0000 | Freq: Three times a day (TID) | ORAL | Status: AC

## 2013-12-01 MED ORDER — DEXAMETHASONE 2 MG PO TABS
2.0000 mg | ORAL_TABLET | Freq: Two times a day (BID) | ORAL | Status: DC
Start: 2013-12-01 — End: 2013-12-01
  Administered 2013-12-01: 2 mg via ORAL
  Filled 2013-12-01 (×3): qty 1

## 2013-12-01 MED ORDER — MAGIC MOUTHWASH: 5.0000 mL | Freq: Four times a day (QID) | ORAL | Status: AC | PRN

## 2013-12-01 MED ORDER — ONDANSETRON 4 MG PO TBDP: 4.0000 mg | ORAL_TABLET | Freq: Three times a day (TID) | ORAL | Status: AC | PRN

## 2013-12-01 MED ORDER — MORPHINE SULFATE (CONCENTRATE) 10 MG /0.5 ML PO SOLN: 5.0000 mg | ORAL | Status: AC | PRN

## 2013-12-01 MED ORDER — ONDANSETRON 4 MG PO TBDP
4.0000 mg | ORAL_TABLET | Freq: Three times a day (TID) | ORAL | Status: DC | PRN
  Filled 2013-12-01: qty 1

## 2013-12-01 NOTE — Progress Notes (Signed)
Progress Note from the Palliative Medicine Team at [facility]:  -patient is lethargic, continued c/o nausea and weakness   -wife and daughter at bedside  -plan is for home with hospice, they feels better about the plan since speaking with Dr Alen Blew        -placed call to case manager, family is hopeful for dc today  Objective: Allergies  Allergen Reactions  . Lisinopril     REACTION: acute renal failure: Patient is not familiar with this medication or an allergy   Scheduled Meds: . cefTRIAXone (ROCEPHIN)  IV  1 g Intravenous Q24H  . dexamethasone  2 mg Oral BID WC  . feeding supplement (ENSURE)  1 Container Oral TID BM  . hydrocerin   Topical BID  . insulin aspart  0-9 Units Subcutaneous TID WC  . metoprolol  100 mg Oral BID  . verapamil  120 mg Oral QHS   Continuous Infusions: . sodium chloride 125 mL/hr at 12/01/13 0216   PRN Meds:.acetaminophen, docusate sodium, magic mouthwash, morphine CONCENTRATE, ondansetron (ZOFRAN) IV, ondansetron, prochlorperazine  BP 118/57  Pulse 82  Temp(Src) 97.7 F (36.5 C) (Oral)  Resp 20  Ht 5\' 10"  (1.778 m)  Wt 84.5 kg (186 lb 4.6 oz)  BMI 26.73 kg/m2  SpO2 99%   PPS:30 % at best  Nausea: "last night was bad again "    Intake/Output Summary (Last 24 hours) at 12/01/13 0943 Last data filed at 12/01/13 0700  Gross per 24 hour  Intake   3050 ml  Output    350 ml  Net   2700 ml        Physical Exam:  General: chronically ill appearing, lethargic, barely audible voice  HEENT: Dry buccal membranes, no exudate  Chest: decreased in bases, CTA  CVS: RRR  Abdomen soft NT +BS  Ext: moves all four, BLE +1 edema  Neuro: oriented X3   Labs: CBC    Component Value Date/Time   WBC 8.0 11/28/2013 0100   WBC 5.8 10/16/2013 1543   RBC 3.73* 11/28/2013 0100   RBC 2.96* 10/16/2013 1543   HGB 10.9* 11/28/2013 0115   HGB 8.5* 10/16/2013 1543   HCT 32.0* 11/28/2013 0115   HCT 25.8* 10/16/2013 1543   PLT 297 11/28/2013 0100   PLT  389 10/16/2013 1543   MCV 90.3 11/28/2013 0100   MCV 87.2 10/16/2013 1543   MCH 28.2 11/28/2013 0100   MCH 28.8 10/16/2013 1543   MCHC 31.2 11/28/2013 0100   MCHC 33.0 10/16/2013 1543   RDW 16.9* 11/28/2013 0100   RDW 16.5* 10/16/2013 1543   LYMPHSABS 0.7 11/28/2013 0100   LYMPHSABS 0.3* 10/16/2013 1543   MONOABS 0.6 11/28/2013 0100   MONOABS 0.2 10/16/2013 1543   EOSABS 0.1 11/28/2013 0100   EOSABS 0.0 10/16/2013 1543   BASOSABS 0.0 11/28/2013 0100   BASOSABS 0.0 10/16/2013 1543    BMET    Component Value Date/Time   NA 145 12/01/2013 0403   NA 135* 10/16/2013 1543   K 4.8 12/01/2013 0403   K 4.4 10/16/2013 1543   CL 108 12/01/2013 0403   CO2 16* 12/01/2013 0403   CO2 21* 10/16/2013 1543   GLUCOSE 142* 12/01/2013 0403   GLUCOSE 274* 10/16/2013 1543   BUN 103* 12/01/2013 0403   BUN 91.9* 10/16/2013 1543   CREATININE 4.97* 12/01/2013 0403   CREATININE 4.7* 10/16/2013 1543   CREATININE 2.39* 01/28/2013 1133   CALCIUM 9.8 12/01/2013 0403   CALCIUM 9.6 10/16/2013  1543   GFRNONAA 10* 12/01/2013 0403   GFRAA 12* 12/01/2013 0403    CMP     Component Value Date/Time   NA 145 12/01/2013 0403   NA 135* 10/16/2013 1543   K 4.8 12/01/2013 0403   K 4.4 10/16/2013 1543   CL 108 12/01/2013 0403   CO2 16* 12/01/2013 0403   CO2 21* 10/16/2013 1543   GLUCOSE 142* 12/01/2013 0403   GLUCOSE 274* 10/16/2013 1543   BUN 103* 12/01/2013 0403   BUN 91.9* 10/16/2013 1543   CREATININE 4.97* 12/01/2013 0403   CREATININE 4.7* 10/16/2013 1543   CREATININE 2.39* 01/28/2013 1133   CALCIUM 9.8 12/01/2013 0403   CALCIUM 9.6 10/16/2013 1543   PROT 7.0 11/28/2013 0100   PROT 6.5 10/16/2013 1543   ALBUMIN 2.8* 11/28/2013 0100   ALBUMIN 2.1* 10/16/2013 1543   AST 45* 11/28/2013 0100   AST 38* 10/16/2013 1543   ALT 19 11/28/2013 0100   ALT 31 10/16/2013 1543   ALKPHOS 158* 11/28/2013 0100   ALKPHOS 113 10/16/2013 1543   BILITOT 0.8 11/28/2013 0100   BILITOT 0.55 10/16/2013 1543   GFRNONAA 10* 12/01/2013 0403   GFRAA 12* 12/01/2013 0403    Assessment and Plan: 1. Code  Status: DNR/DNI-comfort is main focus of care                             -minimize medications for comfort 2. Symptom Control:          Pain/Dyspnea: Roxanol 5 mg po every 4 hrs prn          Nausea: Zofran                         Dexamethazone 2 mg po every 12 hrs 3. Psycho/Social:  Emotional support to family and patient.  It is hard for everyone to come to terms with the limited prognosis.  Continued conversation/education regarding natural trajectory and expectations at EOL   4. Spiritual:  Strong community church support  5. Disposition:  Hopeful for home with hospice  Patient Documents Completed or Given: Document Given Completed  Advanced Directives Pkt    MOST  yes  DNR    Gone from My Sight    Hard Choices yes     Time In Time Out Total Time Spent with Patient Total Overall Time  0800 0900 60 min 60 min    Greater than 50%  of this time was spent counseling and coordinating care related to the above assessment and plan.  Wadie Lessen NP  Palliative Medicine Team Team Phone # [phone] Pager [phone]  Discussed with Dr Wyline Copas 1

## 2013-12-01 NOTE — Progress Notes (Signed)
Patient seen and examined today events noted in the last few days. 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 Status post radiation therapy currently on supportive care only. Clearly he is declining slowly which is multifactorial a lot of it is related to his malignancy as well as decline in his renal function. I discussed these findings with his family today I feel it certainly hospice candidate with very limited life expectancy. I've also have recommended to cancel any further imaging studies due to the fact illness change our current management. All her questions are answered today their satisfaction.

## 2013-12-01 NOTE — Discharge Summary (Signed)
Physician Discharge Summary  Samuel Garner DOB: Mar 07, 1933 DOA: 11/28/2013  PCP: Delphina Cahill, MD  Admit date: 11/28/2013 Discharge date: 12/01/2013  Time spent: 40 minutes  Recommendations for Outpatient Follow-up:  1. Follow up with PCP on as needed basis  Discharge Diagnoses:  Principal Problem:   Altered mental status Active Problems:   Diabetes mellitus, type 2   Chronic kidney disease   Small cell carcinoma of prostate   ARF (acute renal failure)   UTI (lower urinary tract infection)   Dehydration   Protein-calorie malnutrition, severe   Palliative care encounter   Weakness generalized   Pain, generalized   Discharge Condition: Stable  Diet recommendation: Regular, as tolerated  Filed Weights   11/28/13 1219  Weight: 84.5 kg (186 lb 4.6 oz)    History of present illness:  Samuel Garner is a 80 y.o. male who presents to the ED with 4 day history of worsening mental status. Per family he has been lethargic and sleeping all the time, very poor PO intake, drinking essentially no water at all. Family states that this correlates with use of oxycodone which was recently prescribed for pain. AMS has been gradually worsening during this time course. He has been unable to eat, drink, or get out of bed for the past 4 days, until finally today in the ED he was administered narcan which woke him up.   Patient has complex medical history: he was recently diagnosed with prostate cancer which has caused urinary retention and bilateral hydronephrosis and kidney failure in January. After failing bilateral urinary stents in January, he ultimately had B nephrostomy tubes placed. These were supposed to be internalized in Feb but due to infection at one of the sites they were not and instead plans were made for follow up in 6-8 weeks at that time (in the next few days or so at this point he as appointment to be seen with IR according to family).  Hospital Course:  1. AMS -  likely multiple etiologies including dehydration, suspected UTI, active malignancy.  1. The patient was cont on IVF hydration 2. Cont on empiric rocephin, with urine culture later pos for enterococcus species, pan sensitive - pt will be transitioned to levofloxacin on discharge 3. There were initial concerns of possible narcotic overdose with pain meds subsequently weaned. Discussed with Palliative Care with recs for readdressing pain meds, especially given pt's active cancer (see below) 4. The patient was started on, and tolerated PO morphine 2. AKI - mostly secondary to dehydration. Nephrostomy tubes changed out 11/28/13. Tramadol d/c'd 3. CKD - baseline Cr of around 2.5, continued to rise to peak of 4.9 on day of discharge 4. DM2 - given poor intake, holding actos and lantus. Cont SSI. Glucose holding steady. 5. Small Cell Prostate Ca  1. Greatly appreciate Palliative Care input. DNR. Goals of care noted. Plans for hospice care at home. 6. Diabetic foot ulcer  1. Open fissure under 3rd toe of L foot 2. No active draining 3. No purulence 4. No surrounding erythema 5. Suspect caused by dry, chapped and cracked skin 6. Added eucerin cream for remainder of foot 7. Subjective SOB  1. In the setting of IVF, will obtained cxr to r/o flash pulm edema - no evidence of pulm edema per my read 2. On minimal o2 support 3. No crackles on exam 8. Thrush  1. Magic mouthwash ordered with good results  Procedures: Nephrostomy tube change 11/28/13   Consultations:  Palliative care  IR  Oncology  Discharge Exam: Filed Vitals:   11/30/13 0453 11/30/13 1335 11/30/13 2048 12/01/13 0551  BP: 100/54 105/72 121/59 118/57  Pulse: 59 69 87 82  Temp: 97.3 F (36.3 C) 98 F (36.7 C) 97.5 F (36.4 C) 97.7 F (36.5 C)  TempSrc: Oral Oral Oral Oral  Resp: 18 16 20 20   Height:      Weight:      SpO2: 98% 97% 96% 99%    General: Awake, in nad Cardiovascular: regular, s1, s2 Respiratory: normal  resp effort, no wheezing  Discharge Instructions       Future Appointments Provider Department Dept Phone   12/02/2013 9:00 AM [facility] [phone]   12/09/2013 9:00 AM Wyatt Portela, MD [facility] [phone]   01/09/2014 11:30 AM [facility] [phone]       Medication List    STOP taking these medications       calcitRIOL 0.25 MCG capsule  Commonly known as:  ROCALTROL     cholecalciferol 1000 UNITS tablet  Commonly known as:  VITAMIN D     fenofibrate micronized 43 MG capsule  Commonly known as:  ANTARA     furosemide 40 MG tablet  Commonly known as:  LASIX     niacin 500 MG tablet     oxyCODONE 5 MG immediate release tablet  Commonly known as:  Oxy IR/ROXICODONE     pioglitazone 30 MG tablet  Commonly known as:  ACTOS     pravastatin 40 MG tablet  Commonly known as:  PRAVACHOL     PRESERVISION/LUTEIN Caps     traMADol 50 MG tablet  Commonly known as:  ULTRAM      TAKE these medications       acetaminophen 500 MG tablet  Commonly known as:  TYLENOL  Take 500-1,000 mg by mouth every 6 (six) hours as needed for moderate pain or headache.     dexamethasone 2 MG tablet  Commonly known as:  DECADRON  Take 1 tablet (2 mg total) by mouth 2 (two) times daily with a meal.     docusate sodium 100 MG capsule  Commonly known as:  COLACE  Take 100 mg by mouth 2 (two) times daily as needed for mild constipation.     feeding supplement (ENSURE) Pudg  Take 1 Container by mouth 3 (three) times daily between meals.     insulin glargine 100 UNIT/ML injection  Commonly known as:  LANTUS  Inject 10 Units into the skin at bedtime.     magic mouthwash Soln  Take 5 mLs by mouth 4 (four) times daily as needed for mouth pain.     metoprolol 100 MG tablet  Commonly known as:  LOPRESSOR  Take 100 mg by mouth 2 (two) times daily.      morphine CONCENTRATE 10 mg / 0.5 ml concentrated solution  Take 0.25 mLs (5 mg total) by mouth every 4 (four) hours as needed for moderate pain or shortness of breath.     ondansetron 4 MG disintegrating tablet  Commonly known as:  ZOFRAN-ODT  Take 1 tablet (4 mg total) by mouth every 8 (eight) hours as needed for nausea or vomiting.     prochlorperazine 10 MG tablet  Commonly known as:  COMPAZINE  Take 1 tablet (10 mg total) by mouth every 6 (six) hours as needed for nausea or vomiting.  verapamil 120 MG CR tablet  Commonly known as:  CALAN-SR  Take 1 tablet (120 mg total) by mouth at bedtime.       Allergies  Allergen Reactions  . Lisinopril     REACTION: acute renal failure: Patient is not familiar with this medication or an allergy   Follow-up Information   Call Delphina Cahill, MD. (As needed)    Specialty:  Internal Medicine   Contact information:    [place] [address] [phone]        The results of significant diagnostics from this hospitalization (including imaging, microbiology, ancillary and laboratory) are listed below for reference.    Significant Diagnostic Studies: Ir Nephrostomy Tube Change  11/28/2013   CLINICAL DATA:  Bilateral nephrostomy catheter.  Prostate carcinoma.  EXAM: PERC TUBE CHG W/CM  FLUOROSCOPY TIME:  42 seconds.  MEDICATIONS AND MEDICAL HISTORY: None  ANESTHESIA/SEDATION: None  CONTRAST:  20 cc Omnipaque 300  PROCEDURE: The procedure, risks, benefits, and alternatives were explained to the patient. Questions regarding the procedure were encouraged and answered. The patient understands and consents to the procedure.  The back was prepped with Betadine in a sterile fashion, and a sterile drape was applied covering the operative field. A sterile gown and sterile gloves were used for the procedure.  1% lidocaine was utilized for local anesthesia. The existing left nephrostomy was cut and exchanged over a Bentson wire for a new 10  French nephrostomy catheter. It was coiled, string fixed, and sewn to the skin, positioned in the renal pelvis. Contrast was injected. The identical procedure was performed for the right kidney.  FINDINGS: Images document exchange of the right and left percutaneous nephrostomy catheters.  COMPLICATIONS: None  IMPRESSION: Successful bilateral percutaneous nephrostomy catheter exchange.   Electronically Signed   By: Maryclare Bean M.D.   On: 11/28/2013 16:32   Ir Nephrostomy Tube Change  11/28/2013   CLINICAL DATA:  Bilateral nephrostomy catheter.  Prostate carcinoma.  EXAM: PERC TUBE CHG W/CM  FLUOROSCOPY TIME:  42 seconds.  MEDICATIONS AND MEDICAL HISTORY: None  ANESTHESIA/SEDATION: None  CONTRAST:  20 cc Omnipaque 300  PROCEDURE: The procedure, risks, benefits, and alternatives were explained to the patient. Questions regarding the procedure were encouraged and answered. The patient understands and consents to the procedure.  The back was prepped with Betadine in a sterile fashion, and a sterile drape was applied covering the operative field. A sterile gown and sterile gloves were used for the procedure.  1% lidocaine was utilized for local anesthesia. The existing left nephrostomy was cut and exchanged over a Bentson wire for a new 10 French nephrostomy catheter. It was coiled, string fixed, and sewn to the skin, positioned in the renal pelvis. Contrast was injected. The identical procedure was performed for the right kidney.  FINDINGS: Images document exchange of the right and left percutaneous nephrostomy catheters.  COMPLICATIONS: None  IMPRESSION: Successful bilateral percutaneous nephrostomy catheter exchange.   Electronically Signed   By: Maryclare Bean M.D.   On: 11/28/2013 16:32   Dg Chest Port 1 View  11/29/2013   CLINICAL DATA:  Shortness of breath  EXAM: PORTABLE CHEST - 1 VIEW  COMPARISON:  11/28/2013  FINDINGS: Mild linear scarring in the right mid lung. Lungs otherwise clear. No pleural effusion or  pneumothorax.  The heart is top-normal in size.  IMPRESSION: No evidence of acute cardiopulmonary disease.   Electronically Signed   By: Julian Hy M.D.   On: 11/29/2013  18:25   Dg Chest Port 1 View  11/28/2013   CLINICAL DATA:  Shortness of breath and nausea.  EXAM: PORTABLE CHEST - 1 VIEW  COMPARISON:  PA and lateral chest 09/01/2013.  FINDINGS: Lungs clear. Heart size normal. No pneumothorax or pleural effusion.  IMPRESSION: No acute disease.   Electronically Signed   By: Inge Rise M.D.   On: 11/28/2013 01:27    Microbiology: Recent Results (from the past 240 hour(s))  URINE CULTURE     Status: None   Collection Time    11/28/13  1:34 AM      Result Value Ref Range Status   Specimen Description URINE, RANDOM   Final   Special Requests NONE   Final   Culture  Setup Time     Final   Value: 11/28/2013 08:59     Performed at [facility]     Final   Value: 40,000 COLONIES/ML     Performed at [facility]   Culture     Final   Value: ENTEROCOCCUS SPECIES     Performed at [facility]   Report Status 12/01/2013 FINAL   Final   Organism ID, Bacteria ENTEROCOCCUS SPECIES   Final     Labs: Basic Metabolic Panel:  Recent Labs Lab 11/28/13 0100 11/28/13 0115 11/29/13 0421 11/30/13 0611 12/01/13 0403  NA 142 142 145 144 145  K 4.7 5.5* 4.8 4.4 4.8  CL 98 107 106 106 108  CO2 22  --  20 18* 16*  GLUCOSE 146* 140* 119* 152* 142*  BUN 104* 119* 108* 105* 103*  CREATININE 3.90* 4.30* 4.50* 4.74* 4.97*  CALCIUM 11.6*  --  10.3 10.0 9.8   Liver Function Tests:  Recent Labs Lab 11/28/13 0100  AST 45*  ALT 19  ALKPHOS 158*  BILITOT 0.8  PROT 7.0  ALBUMIN 2.8*   No results found for this basename: LIPASE, AMYLASE,  in the last 168 hours No results found for this basename: AMMONIA,  in the last 168 hours CBC:  Recent Labs Lab 11/28/13 0100 11/28/13 0115  WBC 8.0  --   NEUTROABS 6.6  --   HGB 10.5* 10.9*  HCT 33.7*  32.0*  MCV 90.3  --   PLT 297  --    Cardiac Enzymes: No results found for this basename: CKTOTAL, CKMB, CKMBINDEX, TROPONINI,  in the last 168 hours BNP: BNP (last 3 results)  Recent Labs  09/01/13 0513  PROBNP 2536.0*   CBG:  Recent Labs Lab 11/30/13 1153 11/30/13 1653 11/30/13 2027 12/01/13 0754 12/01/13 1206  GLUCAP 128* 111* 130* 135* 162*   Signed:  Bernestine Holsapple K  [facility] 12/01/2013, 1:27 PM

## 2013-12-01 NOTE — Consult Note (Signed)
I have reviewed this case with our NP and agree with the Assessment and Plan as stated.  Edison Wollschlager L. Brandee Markin, MD MBA [facility] Palliative [facility] Team at [facility] Phone: [phone] Pager: [phone]   

## 2013-12-01 NOTE — Progress Notes (Signed)
CSW consulted for transportation needs. Patient will need non-emergency ambulance transport home. CSW confirmed home address with patient's daughter, Jeannetta Nap. [facility] called for 1:45pm transport pickup (Service Request Id: [id number]). No other CSW needs identified - CSW signing off.   Raynaldo Opitz, [facility] Clinical Social Worker cell #: [phone]

## 2013-12-02 ENCOUNTER — Other Ambulatory Visit: Payer: Medicare Other

## 2013-12-02 ENCOUNTER — Ambulatory Visit (HOSPITAL_COMMUNITY): Payer: Medicare Other

## 2013-12-03 DIAGNOSIS — R110 Nausea: Secondary | ICD-10-CM

## 2013-12-09 ENCOUNTER — Other Ambulatory Visit (HOSPITAL_COMMUNITY): Payer: Medicare Other

## 2013-12-09 ENCOUNTER — Ambulatory Visit: Payer: Medicare Other | Admitting: Oncology

## 2013-12-10 DEATH — deceased

## 2013-12-16 ENCOUNTER — Other Ambulatory Visit (HOSPITAL_COMMUNITY): Payer: Medicare Other

## 2013-12-26 ENCOUNTER — Other Ambulatory Visit (HOSPITAL_COMMUNITY): Payer: Medicare Other

## 2014-01-01 NOTE — Progress Notes (Signed)
Agree 

## 2014-01-09 ENCOUNTER — Other Ambulatory Visit (HOSPITAL_COMMUNITY): Payer: Medicare Other

## 2015-04-17 IMAGING — CT CT ABD-PELV W/O CM
2 of 4 series · 16 of 46 positions shown, 18 images · non-contrast
Comparison: None.

CLINICAL DATA: Abdominal pain, gross hematuria.

EXAM:
CT ABDOMEN AND PELVIS WITHOUT CONTRAST
TECHNIQUE: Multidetector CT imaging of the abdomen and pelvis was performed
following the standard protocol without intravenous contrast.

[Series 2: standard/full over (age)lbs 5.0 · axial · 0.86mm/px · z∈[-154,+236]mm · 13 of 92 slices shown, 15 images]
[im 7/92  soft-tissue]
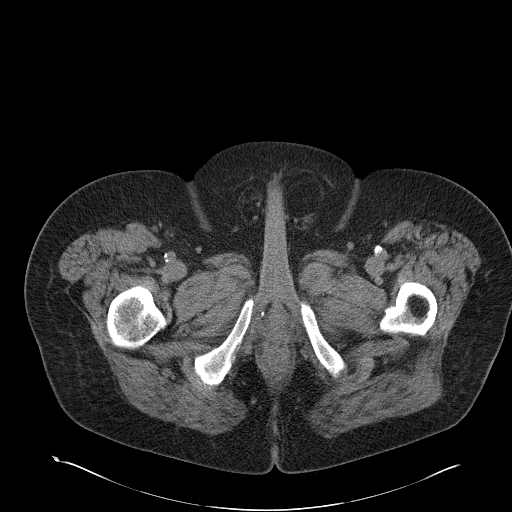
[im 7/92  bone]
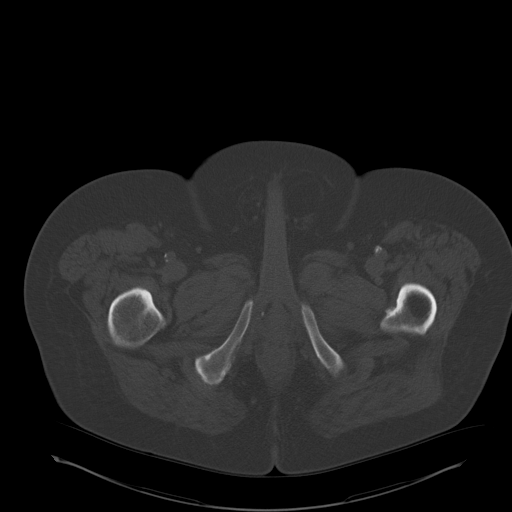
[im 14/92  soft-tissue]
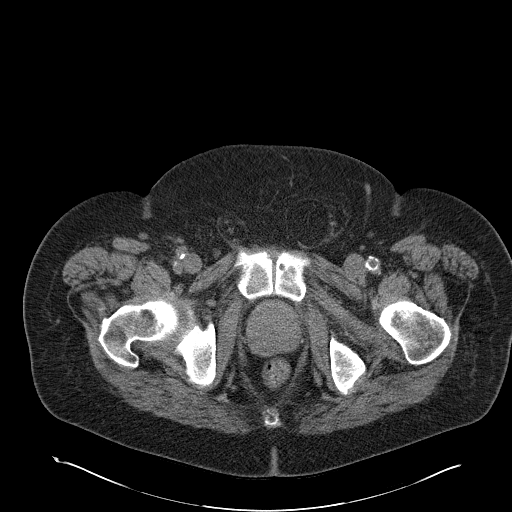
[im 20/92  soft-tissue]
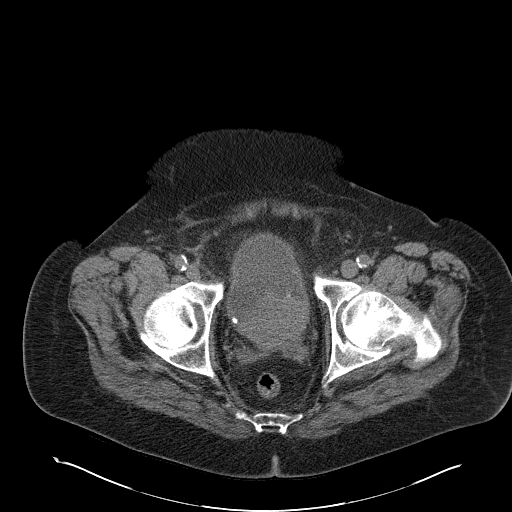
[im 27/92  soft-tissue]
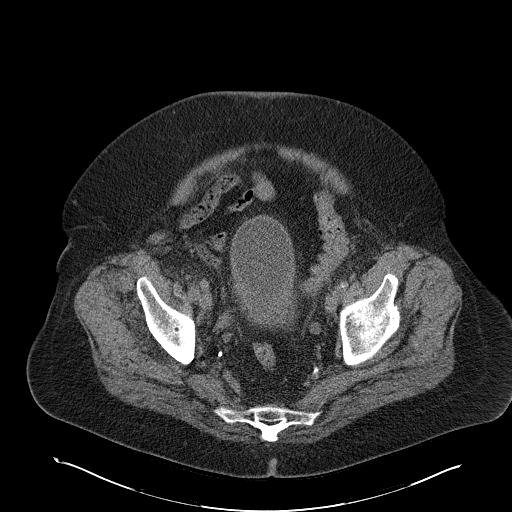
[im 33/92  soft-tissue]
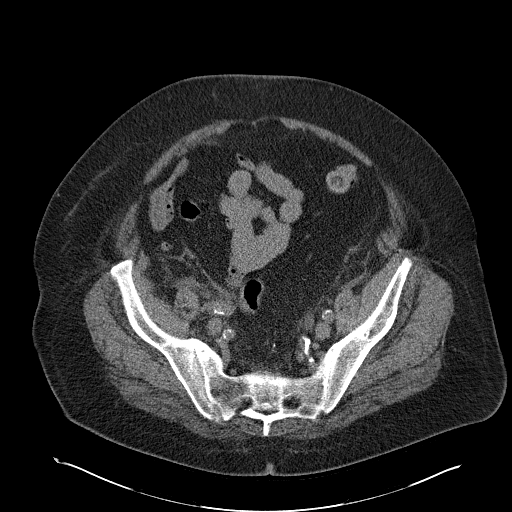
[im 40/92  soft-tissue]
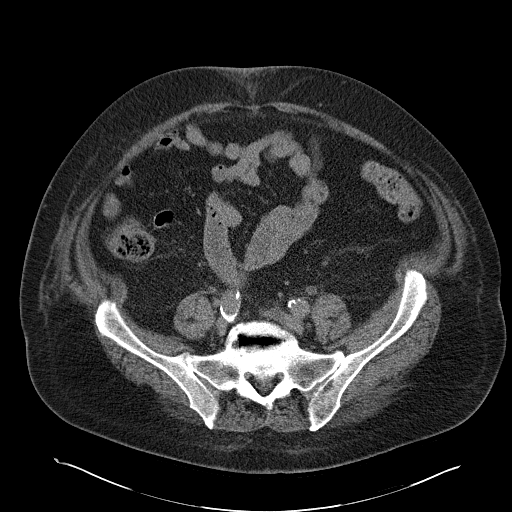
[im 46/92  soft-tissue]
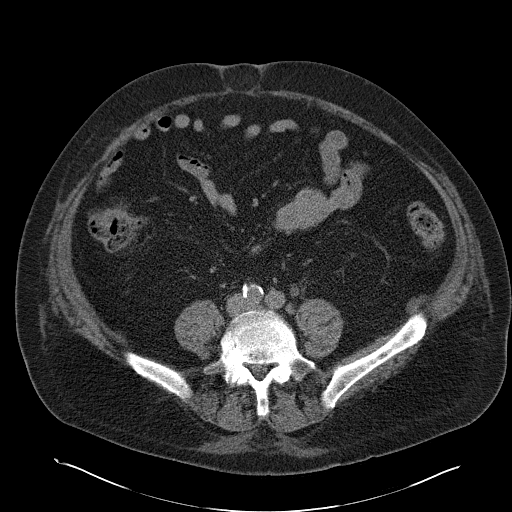
[im 53/92  soft-tissue]
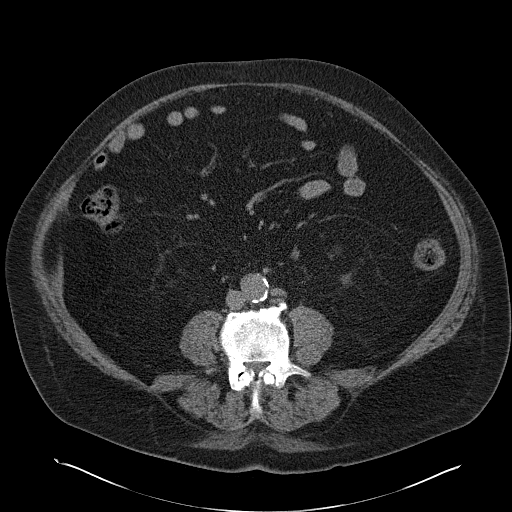
[im 59/92  soft-tissue]
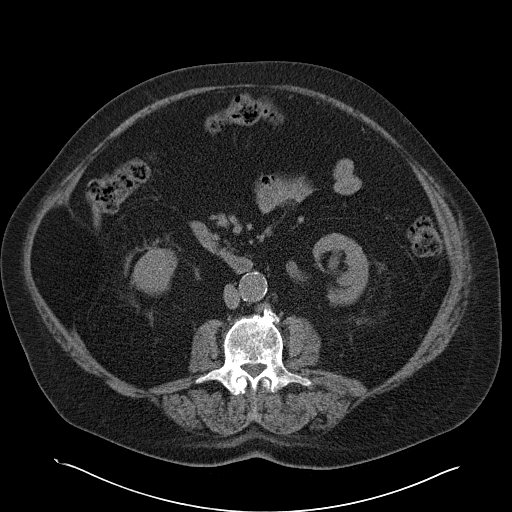
[im 59/92  bone]
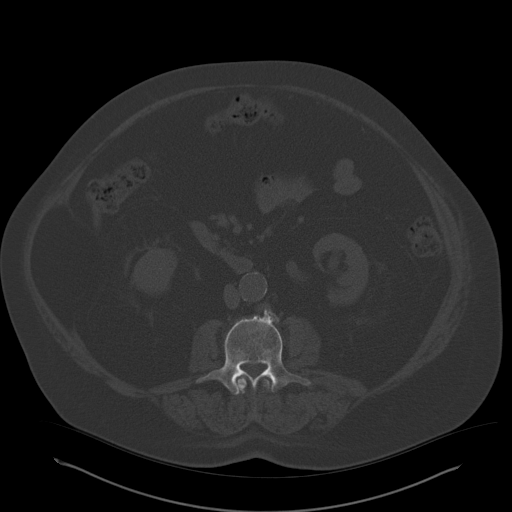
[im 66/92  soft-tissue]
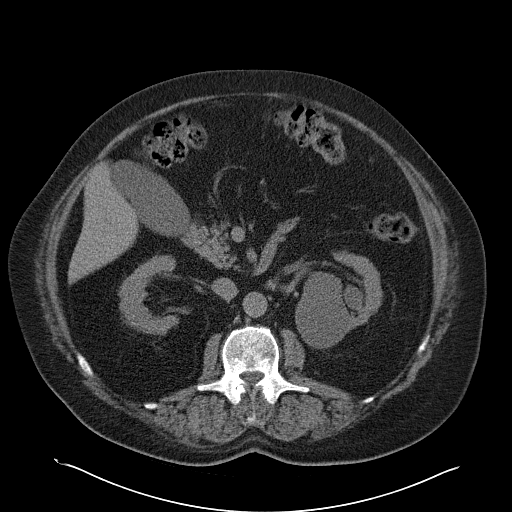
[im 72/92  soft-tissue]
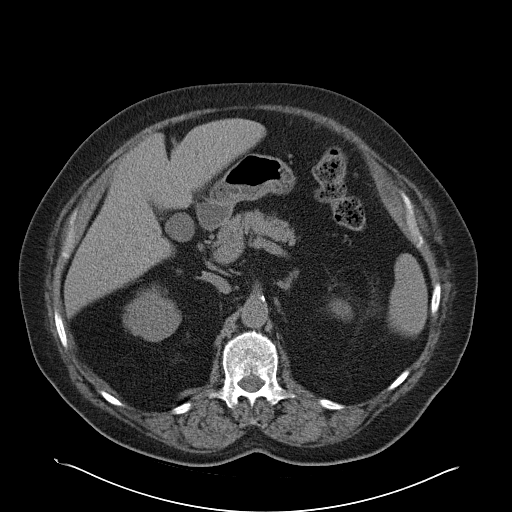
[im 79/92  soft-tissue]
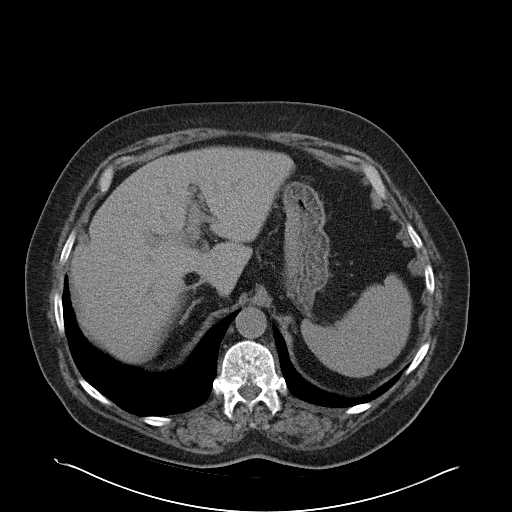
[im 85/92  soft-tissue]
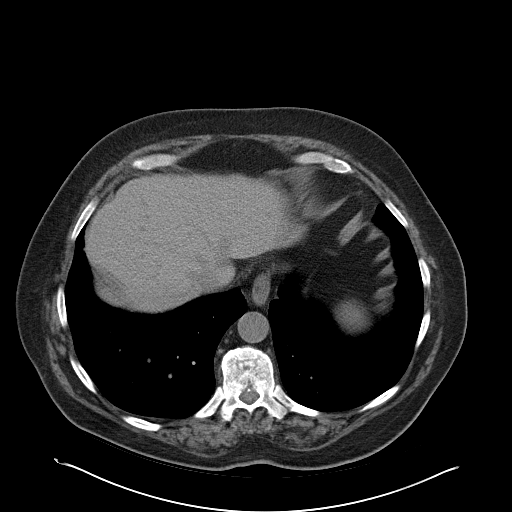

[Series 4: mpr coronal · coronal · 0.88mm/px · 3 of 107 slices shown]
[im 36/107  soft-tissue]
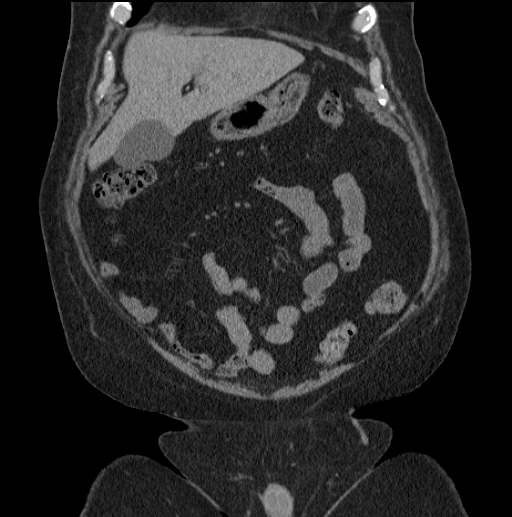
[im 48/107  soft-tissue]
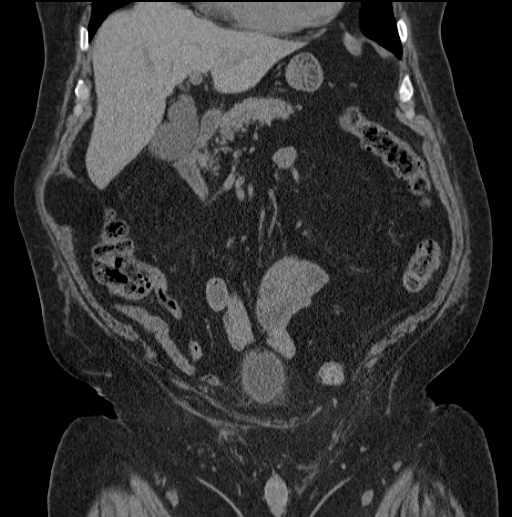
[im 59/107  soft-tissue]
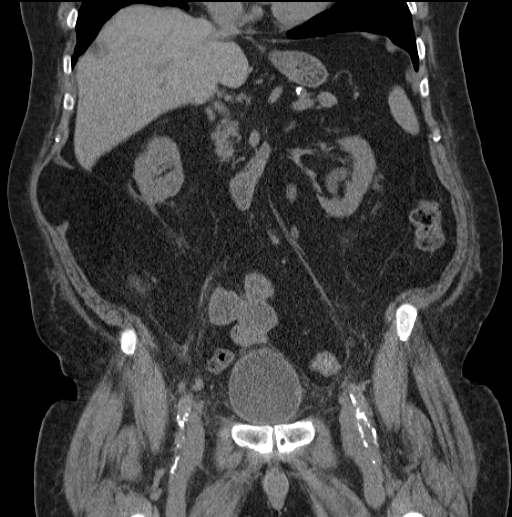

[16 of 46 positions shown; findings below may reference images not displayed]

FINDINGS: Visualized lung bases appear normal. These unenhanced images
demonstrate no focal abnormality of the liver, spleen or pancreas.
No gallstones are noted. The adrenal glands appear normal. Splenic
artery calcifications are noted. 6.6 cm cyst is seen arising from
upper pole of left kidney. No hydronephrosis or renal obstruction is
seen on the right. However, nonobstructive calculus is noted in
midpole collecting system of left kidney. 1.9 cm exophytic cyst
arises from lower pole of left kidney. Moderate left
hydroureteronephrosis is noted secondary to 6 mm calculus in distal
left ureter. The appendix appears normal. There is no evidence of
bowel obstruction. At least 2 large calculi are noted within the
urinary bladder irregular mass measuring 6.7 x 4.7 cm is noted
posteriorly and inferiorly in the urinary bladder concerning for
neoplasm. Fat containing left inguinal hernia is noted. No abnormal
fluid collection is noted. Several large right external iliac lymph
nodes are noted with the largest measuring 16 x 10 mm. Given the
presence of probable bladder mass, these are concerning for
metastatic disease. Atherosclerotic calcifications of abdominal
aorta and iliac arteries are noted without aneurysm formation.
IMPRESSION: Nonobstructive left renal calculus is noted. Moderate left
hydroureteronephrosis is noted secondary to 6 mm distal left
ureteral calculus.

Bladder calculi are noted. 6.7 x 4.7 cm irregular mass is noted
posteriorly and inferiorly in the urinary bladder concerning for
neoplasm or malignancy; cystoscopy is recommended for further
evaluation.

Enlarged right external iliac lymph nodes are which are concerning
for metastatic disease given the presence of possible bladder mass.
